# Patient Record
Sex: Female | Born: 1935 | Race: Black or African American | Hispanic: No | Smoking: Never smoker
Health system: Southern US, Community
[De-identification: ages and names within clinical notes are randomized; demographics above are authoritative.]

## PROBLEM LIST (undated history)

## (undated) DIAGNOSIS — Z1612 Extended spectrum beta lactamase (ESBL) resistance: Secondary | ICD-10-CM

## (undated) DIAGNOSIS — F0390 Unspecified dementia without behavioral disturbance: Secondary | ICD-10-CM

## (undated) DIAGNOSIS — K5900 Constipation, unspecified: Secondary | ICD-10-CM

## (undated) DIAGNOSIS — I739 Peripheral vascular disease, unspecified: Secondary | ICD-10-CM

## (undated) DIAGNOSIS — E119 Type 2 diabetes mellitus without complications: Secondary | ICD-10-CM

## (undated) DIAGNOSIS — N189 Chronic kidney disease, unspecified: Secondary | ICD-10-CM

## (undated) DIAGNOSIS — K219 Gastro-esophageal reflux disease without esophagitis: Secondary | ICD-10-CM

## (undated) DIAGNOSIS — E559 Vitamin D deficiency, unspecified: Secondary | ICD-10-CM

## (undated) DIAGNOSIS — M810 Age-related osteoporosis without current pathological fracture: Secondary | ICD-10-CM

## (undated) DIAGNOSIS — K3184 Gastroparesis: Secondary | ICD-10-CM

## (undated) DIAGNOSIS — E876 Hypokalemia: Secondary | ICD-10-CM

## (undated) DIAGNOSIS — I749 Embolism and thrombosis of unspecified artery: Secondary | ICD-10-CM

## (undated) DIAGNOSIS — E785 Hyperlipidemia, unspecified: Secondary | ICD-10-CM

## (undated) DIAGNOSIS — R1310 Dysphagia, unspecified: Secondary | ICD-10-CM

## (undated) DIAGNOSIS — E8809 Other disorders of plasma-protein metabolism, not elsewhere classified: Secondary | ICD-10-CM

## (undated) DIAGNOSIS — I639 Cerebral infarction, unspecified: Secondary | ICD-10-CM

## (undated) DIAGNOSIS — L89329 Pressure ulcer of left buttock, unspecified stage: Secondary | ICD-10-CM

## (undated) DIAGNOSIS — R339 Retention of urine, unspecified: Secondary | ICD-10-CM

## (undated) DIAGNOSIS — N319 Neuromuscular dysfunction of bladder, unspecified: Secondary | ICD-10-CM

## (undated) DIAGNOSIS — I10 Essential (primary) hypertension: Secondary | ICD-10-CM

## (undated) HISTORY — PX: OTHER SURGICAL HISTORY: SHX169

## (undated) HISTORY — PX: CHOLECYSTECTOMY: SHX55

## (undated) HISTORY — DX: Essential (primary) hypertension: I10

## (undated) HISTORY — PX: BELOW KNEE LEG AMPUTATION: SUR23

## (undated) HISTORY — DX: Cerebral infarction, unspecified: I63.9

---

## 1999-08-20 ENCOUNTER — Encounter: Payer: Self-pay | Admitting: Vascular Surgery

## 1999-08-21 ENCOUNTER — Ambulatory Visit: Admission: RE | Admit: 1999-08-21 | Discharge: 1999-08-21 | Payer: Self-pay | Admitting: Vascular Surgery

## 1999-09-02 ENCOUNTER — Inpatient Hospital Stay: Admission: RE | Admit: 1999-09-02 | Discharge: 1999-09-06 | Payer: Self-pay | Admitting: Vascular Surgery

## 1999-11-19 ENCOUNTER — Encounter: Payer: Self-pay | Admitting: Vascular Surgery

## 1999-11-20 ENCOUNTER — Inpatient Hospital Stay (HOSPITAL_COMMUNITY): Admission: RE | Admit: 1999-11-20 | Discharge: 1999-11-23 | Payer: Self-pay | Admitting: Vascular Surgery

## 1999-11-20 ENCOUNTER — Encounter: Payer: Self-pay | Admitting: Vascular Surgery

## 2000-01-15 ENCOUNTER — Encounter (INDEPENDENT_AMBULATORY_CARE_PROVIDER_SITE_OTHER): Payer: Self-pay | Admitting: Specialist

## 2000-01-15 ENCOUNTER — Inpatient Hospital Stay (HOSPITAL_COMMUNITY): Admission: RE | Admit: 2000-01-15 | Discharge: 2000-01-20 | Payer: Self-pay | Admitting: Vascular Surgery

## 2000-01-20 ENCOUNTER — Inpatient Hospital Stay (HOSPITAL_COMMUNITY)
Admission: RE | Admit: 2000-01-20 | Discharge: 2000-01-27 | Payer: Self-pay | Admitting: Physical Medicine and Rehabilitation

## 2000-02-16 ENCOUNTER — Encounter
Admission: RE | Admit: 2000-02-16 | Discharge: 2000-05-16 | Payer: Self-pay | Admitting: Physical Medicine and Rehabilitation

## 2000-05-17 ENCOUNTER — Encounter
Admission: RE | Admit: 2000-05-17 | Discharge: 2000-07-07 | Payer: Self-pay | Admitting: Physical Medicine and Rehabilitation

## 2001-04-13 ENCOUNTER — Ambulatory Visit (HOSPITAL_COMMUNITY): Admission: RE | Admit: 2001-04-13 | Discharge: 2001-04-14 | Payer: Self-pay | Admitting: Ophthalmology

## 2001-04-13 ENCOUNTER — Encounter: Payer: Self-pay | Admitting: Ophthalmology

## 2001-08-22 ENCOUNTER — Ambulatory Visit (HOSPITAL_COMMUNITY): Admission: RE | Admit: 2001-08-22 | Discharge: 2001-08-23 | Payer: Self-pay | Admitting: Ophthalmology

## 2001-08-22 ENCOUNTER — Encounter: Payer: Self-pay | Admitting: Ophthalmology

## 2002-05-17 ENCOUNTER — Encounter: Payer: Self-pay | Admitting: Emergency Medicine

## 2002-05-17 ENCOUNTER — Inpatient Hospital Stay (HOSPITAL_COMMUNITY): Admission: EM | Admit: 2002-05-17 | Discharge: 2002-05-19 | Payer: Self-pay | Admitting: Emergency Medicine

## 2002-06-04 ENCOUNTER — Inpatient Hospital Stay (HOSPITAL_COMMUNITY): Admission: EM | Admit: 2002-06-04 | Discharge: 2002-06-26 | Payer: Self-pay | Admitting: Emergency Medicine

## 2002-06-04 ENCOUNTER — Encounter: Payer: Self-pay | Admitting: Emergency Medicine

## 2002-06-05 ENCOUNTER — Encounter: Payer: Self-pay | Admitting: *Deleted

## 2002-06-14 ENCOUNTER — Encounter: Payer: Self-pay | Admitting: Family Medicine

## 2002-06-19 ENCOUNTER — Encounter: Payer: Self-pay | Admitting: Family Medicine

## 2002-06-20 ENCOUNTER — Encounter: Payer: Self-pay | Admitting: Neurology

## 2002-06-26 ENCOUNTER — Inpatient Hospital Stay (HOSPITAL_COMMUNITY)
Admission: RE | Admit: 2002-06-26 | Discharge: 2002-07-18 | Payer: Self-pay | Admitting: Physical Medicine & Rehabilitation

## 2002-09-13 HISTORY — PX: BELOW KNEE LEG AMPUTATION: SUR23

## 2002-09-19 ENCOUNTER — Encounter
Admission: RE | Admit: 2002-09-19 | Discharge: 2002-12-18 | Payer: Self-pay | Admitting: Physical Medicine & Rehabilitation

## 2002-09-20 ENCOUNTER — Encounter: Payer: Self-pay | Admitting: Vascular Surgery

## 2002-09-20 ENCOUNTER — Inpatient Hospital Stay (HOSPITAL_COMMUNITY): Admission: EM | Admit: 2002-09-20 | Discharge: 2002-10-03 | Payer: Self-pay | Admitting: Emergency Medicine

## 2002-09-21 ENCOUNTER — Encounter (INDEPENDENT_AMBULATORY_CARE_PROVIDER_SITE_OTHER): Payer: Self-pay | Admitting: Specialist

## 2002-11-03 ENCOUNTER — Inpatient Hospital Stay (HOSPITAL_COMMUNITY): Admission: EM | Admit: 2002-11-03 | Discharge: 2002-11-06 | Payer: Self-pay | Admitting: Emergency Medicine

## 2002-11-03 ENCOUNTER — Encounter: Payer: Self-pay | Admitting: Emergency Medicine

## 2003-03-07 ENCOUNTER — Emergency Department (HOSPITAL_COMMUNITY): Admission: EM | Admit: 2003-03-07 | Discharge: 2003-03-07 | Payer: Self-pay | Admitting: Emergency Medicine

## 2004-01-03 ENCOUNTER — Inpatient Hospital Stay (HOSPITAL_COMMUNITY): Admission: EM | Admit: 2004-01-03 | Discharge: 2004-01-07 | Payer: Self-pay | Admitting: Emergency Medicine

## 2004-01-09 ENCOUNTER — Ambulatory Visit (HOSPITAL_COMMUNITY): Admission: RE | Admit: 2004-01-09 | Discharge: 2004-01-09 | Payer: Self-pay | Admitting: Internal Medicine

## 2005-03-01 ENCOUNTER — Ambulatory Visit: Admission: RE | Admit: 2005-03-01 | Discharge: 2005-03-01 | Payer: Self-pay | Admitting: Internal Medicine

## 2005-05-14 ENCOUNTER — Ambulatory Visit (HOSPITAL_COMMUNITY): Admission: RE | Admit: 2005-05-14 | Discharge: 2005-05-14 | Payer: Self-pay | Admitting: Internal Medicine

## 2006-01-03 ENCOUNTER — Ambulatory Visit (HOSPITAL_COMMUNITY): Admission: RE | Admit: 2006-01-03 | Discharge: 2006-01-03 | Payer: Self-pay | Admitting: Internal Medicine

## 2006-06-27 ENCOUNTER — Ambulatory Visit (HOSPITAL_COMMUNITY): Admission: RE | Admit: 2006-06-27 | Discharge: 2006-06-27 | Payer: Self-pay | Admitting: Internal Medicine

## 2006-08-17 ENCOUNTER — Ambulatory Visit: Payer: Self-pay | Admitting: Vascular Surgery

## 2007-11-03 ENCOUNTER — Inpatient Hospital Stay (HOSPITAL_COMMUNITY): Admission: EM | Admit: 2007-11-03 | Discharge: 2007-11-05 | Payer: Self-pay | Admitting: Emergency Medicine

## 2008-07-01 ENCOUNTER — Ambulatory Visit (HOSPITAL_COMMUNITY): Admission: RE | Admit: 2008-07-01 | Discharge: 2008-07-01 | Payer: Self-pay | Admitting: Internal Medicine

## 2008-09-22 ENCOUNTER — Inpatient Hospital Stay (HOSPITAL_COMMUNITY): Admission: EM | Admit: 2008-09-22 | Discharge: 2008-09-26 | Payer: Self-pay | Admitting: Emergency Medicine

## 2008-09-23 ENCOUNTER — Ambulatory Visit: Payer: Self-pay | Admitting: Gastroenterology

## 2008-09-23 ENCOUNTER — Encounter: Payer: Self-pay | Admitting: Gastroenterology

## 2008-09-23 HISTORY — PX: ESOPHAGOGASTRODUODENOSCOPY: SHX1529

## 2008-09-24 ENCOUNTER — Encounter: Payer: Self-pay | Admitting: Gastroenterology

## 2008-09-25 ENCOUNTER — Ambulatory Visit: Payer: Self-pay | Admitting: Internal Medicine

## 2008-11-05 DIAGNOSIS — I10 Essential (primary) hypertension: Secondary | ICD-10-CM

## 2008-11-05 DIAGNOSIS — J449 Chronic obstructive pulmonary disease, unspecified: Secondary | ICD-10-CM | POA: Insufficient documentation

## 2008-11-05 DIAGNOSIS — R112 Nausea with vomiting, unspecified: Secondary | ICD-10-CM

## 2008-11-05 DIAGNOSIS — E785 Hyperlipidemia, unspecified: Secondary | ICD-10-CM | POA: Insufficient documentation

## 2008-11-05 DIAGNOSIS — J4489 Other specified chronic obstructive pulmonary disease: Secondary | ICD-10-CM | POA: Insufficient documentation

## 2008-11-05 DIAGNOSIS — F329 Major depressive disorder, single episode, unspecified: Secondary | ICD-10-CM

## 2008-11-05 DIAGNOSIS — K922 Gastrointestinal hemorrhage, unspecified: Secondary | ICD-10-CM | POA: Insufficient documentation

## 2008-11-05 DIAGNOSIS — I739 Peripheral vascular disease, unspecified: Secondary | ICD-10-CM | POA: Insufficient documentation

## 2009-03-24 ENCOUNTER — Ambulatory Visit: Payer: Self-pay | Admitting: Internal Medicine

## 2009-09-03 ENCOUNTER — Encounter: Payer: Self-pay | Admitting: Gastroenterology

## 2009-09-29 ENCOUNTER — Encounter: Payer: Self-pay | Admitting: Urgent Care

## 2009-09-30 ENCOUNTER — Encounter (INDEPENDENT_AMBULATORY_CARE_PROVIDER_SITE_OTHER): Payer: Self-pay | Admitting: *Deleted

## 2009-10-06 ENCOUNTER — Telehealth (INDEPENDENT_AMBULATORY_CARE_PROVIDER_SITE_OTHER): Payer: Self-pay

## 2009-10-27 ENCOUNTER — Telehealth: Payer: Self-pay | Admitting: Gastroenterology

## 2009-10-27 ENCOUNTER — Ambulatory Visit: Payer: Self-pay | Admitting: Gastroenterology

## 2009-10-27 DIAGNOSIS — K3184 Gastroparesis: Secondary | ICD-10-CM

## 2009-12-18 ENCOUNTER — Ambulatory Visit: Payer: Self-pay | Admitting: Internal Medicine

## 2009-12-18 ENCOUNTER — Emergency Department (HOSPITAL_COMMUNITY): Admission: EM | Admit: 2009-12-18 | Discharge: 2009-12-18 | Payer: Self-pay | Admitting: Emergency Medicine

## 2009-12-20 ENCOUNTER — Inpatient Hospital Stay (HOSPITAL_COMMUNITY): Admission: EM | Admit: 2009-12-20 | Discharge: 2009-12-25 | Payer: Self-pay | Admitting: Emergency Medicine

## 2009-12-22 ENCOUNTER — Encounter (INDEPENDENT_AMBULATORY_CARE_PROVIDER_SITE_OTHER): Payer: Self-pay | Admitting: *Deleted

## 2009-12-24 ENCOUNTER — Ambulatory Visit: Payer: Self-pay | Admitting: Internal Medicine

## 2010-01-02 ENCOUNTER — Encounter (INDEPENDENT_AMBULATORY_CARE_PROVIDER_SITE_OTHER): Payer: Self-pay

## 2010-01-08 ENCOUNTER — Inpatient Hospital Stay (HOSPITAL_COMMUNITY): Admission: EM | Admit: 2010-01-08 | Discharge: 2010-01-12 | Payer: Self-pay | Admitting: Emergency Medicine

## 2010-01-09 ENCOUNTER — Encounter: Payer: Self-pay | Admitting: Gastroenterology

## 2010-01-09 ENCOUNTER — Ambulatory Visit: Payer: Self-pay | Admitting: Family Medicine

## 2010-01-10 ENCOUNTER — Ambulatory Visit: Payer: Self-pay | Admitting: Gastroenterology

## 2010-02-05 ENCOUNTER — Ambulatory Visit: Payer: Self-pay | Admitting: Gastroenterology

## 2010-04-05 ENCOUNTER — Encounter: Payer: Self-pay | Admitting: Internal Medicine

## 2010-04-14 NOTE — Letter (Signed)
Summary: Plan of Care, Need to Discuss  [facility]  [address]   [place], [place] [address]   Phone: [phone]  Fax: [phone]    January 02, 2010  [name] [address] [place], [place]  [address] [birth year]   Dear [name],   We are writing this letter to inform you of treatment plans and/or discuss your plan of care.  We have tried several times to contact you; however, we have yet to reach you.  Dr. Darrick Penna has instructed me to triage you and get you scheduled for a screening colonoscopy. Please call the office at  [phone] and ask to speak with me.     Please do not neglect your health.   Sincerely,    Cloria Spring LPN  [facility] Ph: [phone]    Fax: [phone]

## 2010-04-14 NOTE — Letter (Signed)
Summary: Recall Office Visit  [facility]  [address]   [place], [place] [address]   Phone: [phone]  Fax: [phone]      September 30, 2009   [name] [address] [place], [place]  [address] [birth year]   Dear Ms. Knaus,   According to our records, it is time for you to schedule a follow-up office visit with [place].   At your convenience, please call [phone] to schedule an office visit. If you have any questions, concerns, or feel that this letter is in error, we would appreciate your call.   Sincerely,    Diana Eves  [facility] Ph: [phone]   Fax: [phone]

## 2010-04-14 NOTE — Progress Notes (Signed)
Summary: omeprazole refill  Phone Note Call from Patient   Caller: Daughter- [name] [phone] Summary of Call: pts daughter called- pt is out of omeprazole- wants to know if they can get 1 more refill to last untill pts appt on 10/27/2009. pt uses [facility]. please advise Initial call taken by: Hendricks Limes LPN,  October 06, 2009 11:02 AM     Appended Document: omeprazole refill  Please let pt know she needs to keep OV.  Thanks  Prescriptions: OMEPRAZOLE 20 MG CPDR (OMEPRAZOLE) 1 by mouth daily for acid reflux  #31 x 11   Entered and Authorized by:   Joselyn Arrow FNP-BC   Signed by:   Joselyn Arrow FNP-BC on 10/06/2009   Method used:   Electronically to        [facility] (retail)       [address]       [place], [place]  [address]       Ph: [phone] or [phone]       Fax: [phone]   RxID:   [id number]     Appended Document: omeprazole refill Called, mail box is full.  Appended Document: omeprazole refill Informed pt's daughter.

## 2010-04-14 NOTE — Medication Information (Signed)
Summary: Tax adviser   Imported By: Diana Eves 09/29/2009 10:30:40  _____________________________________________________________________  External Attachment:    Type:   Image     Comment:   External Document  Appended Document: RX Folder Pt needs OV 1st.  Hx NS/cancellation.  Not seen since hospitalization last year.  Appended Document: RX Folder pharmacy aware  Appended Document: RX Folder mailed letter for pt to call us to set up appt to be able to continue getting RFs

## 2010-04-14 NOTE — Medication Information (Signed)
Summary: Tax adviser   Imported By: Rosine Beat 09/29/2009 16:30:23  _____________________________________________________________________  External Attachment:    Type:   Image     Comment:   External Document  Appended Document: RX Folder duplicate

## 2010-04-14 NOTE — Letter (Signed)
Summary: TCS ORDER/TRIAGE  TCS ORDER/TRIAGE   Imported By: Rexene Alberts 01/09/2010 11:58:45  _____________________________________________________________________  External Attachment:    Type:   Image     Comment:   External Document

## 2010-04-14 NOTE — Assessment & Plan Note (Signed)
Summary: GASTROPARESIS   Visit Type:  Follow-up Visit Primary Care Provider:  Felecia Shelling, M.D.  Chief Complaint:  GASTROPARESIS and GERD.  History of Present Illness: No vomiting, or nausea. Heartburn is controlled. Bms: every 2-3 days and good BMs-formed and large. uSE mIRALAX as needed. WORKS VERY WELL. Losing weight on purpose. No blurry vision, tardive dyskinesia, or breast discharge.  Current Medications (verified): 1)  Aspirin 81 Mg Tabs (Aspirin) .... Once Daily 2)  Colace 100 Mg Caps (Docusate Sodium) .... Two Times A Day 3)  Folic Acid 1 Mg Tabs (Folic Acid) .... Every Day 4)  Vitamin C 250 Mg Tabs (Ascorbic Acid) .... Once Daily 5)  Miralax  Powd (Polyethylene Glycol 3350) .... Once Daily 6)  Multivitamins  Tabs (Multiple Vitamin) .... Once Daily 7)  K-Lor 20 Meq Pack (Potassium Chloride) .... Once Daily 8)  Omeprazole 20 Mg Cpdr (Omeprazole) .Marland Kitchen.. 1 By Mouth Daily For Acid Reflux 9)  Metoclopramide Hcl 5 Mg Tabs (Metoclopramide Hcl) .Marland Kitchen.. 1 By Mouth Qac and Hs 10)  Lisinopril 20 Mg Tabs (Lisinopril) .... Take 1 Tablet By Mouth Once A Day 11)  Actos 30 Mg Tabs (Pioglitazone Hcl) .... Take 1 Tablet By Mouth Once A Day 12)  Amlodipine .... As Directed  Allergies (verified): 1)  ! Pcn  Past History:  Past Medical History: Hypertension Stroke  Family History: No FH of Colon Cancer OR POLYPS  Social History: Pt able to ambulate without assistance. Has a BEDSIDE COMMODE. TOBACCO/Alcohol Use - no  Vital Signs:  Patient profile:   77 year old female Height:      64 inches Temp:     98.6 degrees F oral Pulse rate:   72 / minute BP sitting:   140 / 70  (left arm) Cuff size:   large  Vitals Entered By: Cloria Spring LPN (October 27, 2009 2:30 PM)  Physical Exam  General:  Well developed, well nourished, no acute distress. Head:  Normocephalic and atraumatic. Lungs:  Clear throughout to auscultation. Heart:  Regular rate and rhythm; 2/6 SYSTOLIC murmur Abdomen:  Soft,  nontender and nondistended.  Normal bowel sounds. EXAMINED IN CHAIR.  Impression & Recommendations:  Problem # 1:  GASTROPARESIS (ICD-536.3) Assessment Improved  Continue Reglan. No side effects. Refill REGLAN/OMP for one year. OPV in 12 mos.  CC: PCP  Orders: Est. Patient Level II ([address])  Problem # 2:  SCREENING, COLON CANCER (ICD-V76.51) Assessment: Comment Only TCS? after 2000. No alarm symptoms. Will review prior TCS report. Will call pt for TCS if > 10 years. Prescriptions: METOCLOPRAMIDE HCL 5 MG TABS (METOCLOPRAMIDE HCL) 1 by mouth QAC AND HS  #120 x 11   Entered and Authorized by:   West Bali MD   Signed by:   West Bali MD on 10/27/2009   Method used:   Electronically to        [facility] (retail)       [address]       [place], [place]  [address]       Ph: [phone] or [phone]       Fax: [phone]   RxID:   [id number]   Appended Document: GASTROPARESIS 12 MONTH OPV IS IN THE COMPUTER

## 2010-04-14 NOTE — Miscellaneous (Signed)
Summary: CONSULTATION  Clinical Lists Changes NAMEBRONTE, [name]                  ACCOUNT NO.:  [id number]      MEDICAL RECORD NO.:  [id number]          PATIENT TYPE:  INP      LOCATION:  A340                          FACILITY:  [facility]      PHYSICIAN:  R. Roetta Sessions, M.D. DATE OF BIRTH:  [birth year]      DATE OF CONSULTATION:  12/21/2009   DATE OF DISCHARGE:                                    CONSULTATION         REASON FOR CONSULTATION:  Nausea, vomiting, history of gastroparesis.      HISTORY OF PRESENT ILLNESS:  [name] is a pleasant 77 year old African   American lady with longstanding poorly-controlled diabetes mellitus.   She started having nausea and vomiting about 4 days ago.  She has not   had any abdominal pain whatsoever.  She was evaluated in the emergency   apartment on December 18, 2009 and was found to have a blood sugar over   500, she also had evidence of a urinary tract infection with pyuria and   bacteruria on urinalysis.  She has been admitted to the hospital, placed   on IV fluids, hypokalemia has been corrected, her high blood sugar has   been treated and she has been started on Reglan and 5 mg IV q.6 hours as   well as b.i.d. Protonix intravenously.      This lady has a history of gastroparesis documented on a gastric   emptying study previously and as an outpatient she had been taking   Reglan and 5 mg orally twice daily (she apparently is supposed to be on   an a.c., h.s./q.i.d. regimen).  She has also been on omeprazole 20 mg   orally just once daily.      She was seen for a question of hematemesis in July of last year   associated with protracted nausea and vomiting.  She was seen by Dr.   Darrick Penna.  EGD demonstrated gastritis (no H. pylori on biopsies) and   erosive reflux esophagitis.  At that time it was noted that the patient   had never had a colonoscopy and it was recommended she return to have   one done.  She has not followed through.      Ms.  [name] denies any fever, chills.  She has not had any dysphagia or   odynophagia.  She has had chronic early satiety, which has worsened   somewhat recently.  She denies constipation, having one formed stool   daily.  Denies melena or hematochezia.      __________ CT scan, noncontrast on October 6th which demonstrated a   moderate amount of rectal stool, nonobstructing bilateral renal calculi,   no other acute abnormality otherwise.  Small hiatal hernia.  She has   been started on IV Cipro.  She is also on enoxaparin.  Her GI   medications include metoclopramide pantoprazole and p.r.n. Zofran.      This morning her H and H are 14.3 and 43.7.  On  October 8th her H and H   were 15 and 46.Marland Kitchen  Her white count this morning was 11.2, platelet count   298,000.  Her comprehensive metabolic profile this morning demonstrates   a total bilirubin slightly elevated at 1.9, alkaline phosphate 90, AST   and ALT are 22 and 8 respectively.  She does have a history of Gilbert's   syndrome.  Total bilirubin was 1.6 back on October 6th.  Hepatic   function profile on October 8th demonstrated elevated bilirubin of 1.9   with an indirect component of 1.6, alkaline phosphatase, AST and ALT   came back normal      PAST MEDICAL HISTORY:   1. Significant for longstanding poorly-controlled diabetes mellitus.   2. History of hypertension.   3. History of cerebrovascular accident.   4. History of diabetic retinopathy.   5. Peripheral arterial disease with bilateral below-the-knee       amputation.   6. History left femoral-tibial bypass.   7. History of coronary artery disease.   8. History of GERD with erosive reflux esophagitis.   9. Status post cholecystectomy.   10.Status post hysterectomy.      MEDICATIONS ON ADMISSION:  Metoclopramide 5 mg b.i.d., aspirin,   omeprazole, lovastatin, amlodipine, lisinopril, potassium chloride, NPH   insulin.      ALLERGIES:  PENICILLIN CAUSES A RASH.      FAMILY HISTORY:   Mother reportedly died of stomach cancer.  No history   of colon cancer or colon polyps.      SOCIAL HISTORY:  Patient lives in [place].  She lives with her son   and daughter.  She has an aid daily on a regular basis.  No history of   tobacco, alcohol or illicit drug use.      REVIEW OF SYSTEMS:  She denies any history of yellow jaundice, clay-   colored stools, dark-colored urine.  Again, no fever or chills.   Otherwise as in history of present illness.      PHYSICAL EXAMINATION:  GENERAL:  Pleasant, chronically ill lady resting   comfortably, conversant, in no acute distress.   VITAL SIGNS:  Temperature 98.7, pulse 87, respiratory rate 16, BP   180/77, O2 saturation on room-air 98%.   SKIN:  Warm and dry.   HEENT:  No scleral icterus.   CHEST:  Lungs clear to auscultation.   HEART:  Regular rate and rhythm without murmur, rub or gallop.   ABDOMEN:  Full, positive bowel sounds.  She does have a succussion   splash.  Soft and nontender without appreciable mass or organomegaly.   EXTREMITIES:  Bilateral amputee.      ADDITIONAL LABORATORY DATA:  Magnesium 1.9 this morning.  CMP this   morning:  Sodium 135, potassium 3.6, chloride 93, CO2 26, glucose 265,   BUN 10, creatinine 0.74, total bilirubin 1.9, alkaline phosphatase 90,   AST 22, ALT 8.  CBC:  White count 11.2, H and H 14.3/43.7.  Urinalysis   with many bacteria, 7-10 white blood cells.      IMPRESSION:  [name] is a pleasant 77 year old lady admitted to   the hospital with nausea and vomiting in the setting of uncontrolled   diabetes mellitus, hyperglycemia, known gastroparesis, along with a   urinary tract infection.  She is already markedly improved with the   above-mentioned treatment.  She has been taking a relatively low dose of   Reglan as an outpatient for reasons which are not  clear.  She has only   been on a low dose of Reglan in a b.i.d. fashion.  I suspect that her   gastroesophageal reflux disease  has worsened recently in this acute   setting.  She absolutely has no abdominal pain.  Gallbladder is out.   Findings of radiographic and laboratory evaluation also reassuring as   far as the not indicative of any other process.  She does have Gilbert's   syndrome which deserve further consideration.      RECOMMENDATIONS:   1. Agree with b.i.d. proton pump inhibitor therapy with Protonix 40 mg       q.12 hours.   2. Agree with Reglan 5 mg IV q.6 hours.   3. She does appear to be doing well now on a clear liquid diet.  Would       consider advancing her to a carbohydrate modified gastroparesis       diet by tomorrow morning.   4. As previously recommended, this lady has never had a screening       colonoscopy as best we can tell.  Again, strongly recommended she       return as an outpatient in the near future and get this done.      I would like to thank Dr. Felecia Shelling and the hospitalist service for allowing   me to see this nice lady in consultation.               Jonathon Bellows, M.D.               RMR/MEDQ  D:  12/21/2009  T:  12/21/2009  Job:  [id number]      cc:   Tesfaye D. Felecia Shelling, MD   Fax: [phone]      Electronically Signed by Lorrin Goodell M.D. on 12/21/2009 05:38:45 PM

## 2010-04-14 NOTE — Progress Notes (Signed)
Summary: NEEDS TCS  Please call pt. She has never had a TCS. Screening TCS within the next 2 mos, TRILYTE PREP. West Bali MD  October 27, 2009 3:14 PM  Appended Document: NEEDS TCS LM with caregiver, May Motley. She will give message to pt's daughter, Elita Quick and have her return  call tomorrow.  Appended Document: NEEDS TCS Called, mail box full.  Appended Document: NEEDS TCS Letter mailed to call.

## 2010-04-14 NOTE — Letter (Signed)
Summary: [employer] approval of medicare script drug coverage  silverscript approval of medicare script drug coverage   Imported By: Rosine Beat 09/03/2009 09:59:41  _____________________________________________________________________  External Attachment:    Type:   Image     Comment:   External Document

## 2010-05-27 LAB — URINALYSIS, ROUTINE W REFLEX MICROSCOPIC
Bilirubin Urine: NEGATIVE
Bilirubin Urine: NEGATIVE
Glucose, UA: 100 mg/dL — AB
Glucose, UA: 500 mg/dL — AB
Glucose, UA: >1000 mg/dL — AB
Ketones, ur: NEGATIVE mg/dL
Ketones, ur: 15 mg/dL — AB
Nitrite: NEGATIVE
Protein, ur: NEGATIVE mg/dL
Specific Gravity, Urine: 1.015 (ref 1.005–1.030)
Specific Gravity, Urine: 1.015 (ref 1.005–1.030)
Urobilinogen, UA: 1 mg/dL (ref 0.0–1.0)
pH: 6 (ref 5.0–8.0)

## 2010-05-27 LAB — GLUCOSE, CAPILLARY
Glucose-Capillary: 101 mg/dL — ABNORMAL HIGH (ref 70–99)
Glucose-Capillary: 107 mg/dL — ABNORMAL HIGH (ref 70–99)
Glucose-Capillary: 160 mg/dL — ABNORMAL HIGH (ref 70–99)
Glucose-Capillary: 171 mg/dL — ABNORMAL HIGH (ref 70–99)
Glucose-Capillary: 175 mg/dL — ABNORMAL HIGH (ref 70–99)
Glucose-Capillary: 178 mg/dL — ABNORMAL HIGH (ref 70–99)
Glucose-Capillary: 179 mg/dL — ABNORMAL HIGH (ref 70–99)
Glucose-Capillary: 180 mg/dL — ABNORMAL HIGH (ref 70–99)
Glucose-Capillary: 70 mg/dL (ref 70–99)
Glucose-Capillary: 77 mg/dL (ref 70–99)
Glucose-Capillary: 160 mg/dL — ABNORMAL HIGH (ref 70–99)
Glucose-Capillary: 183 mg/dL — ABNORMAL HIGH (ref 70–99)
Glucose-Capillary: 191 mg/dL — ABNORMAL HIGH (ref 70–99)
Glucose-Capillary: 192 mg/dL — ABNORMAL HIGH (ref 70–99)
Glucose-Capillary: 202 mg/dL — ABNORMAL HIGH (ref 70–99)
Glucose-Capillary: 208 mg/dL — ABNORMAL HIGH (ref 70–99)
Glucose-Capillary: 233 mg/dL — ABNORMAL HIGH (ref 70–99)
Glucose-Capillary: 238 mg/dL — ABNORMAL HIGH (ref 70–99)
Glucose-Capillary: 246 mg/dL — ABNORMAL HIGH (ref 70–99)
Glucose-Capillary: 261 mg/dL — ABNORMAL HIGH (ref 70–99)
Glucose-Capillary: 264 mg/dL — ABNORMAL HIGH (ref 70–99)
Glucose-Capillary: 269 mg/dL — ABNORMAL HIGH (ref 70–99)
Glucose-Capillary: 276 mg/dL — ABNORMAL HIGH (ref 70–99)
Glucose-Capillary: 286 mg/dL — ABNORMAL HIGH (ref 70–99)
Glucose-Capillary: 299 mg/dL — ABNORMAL HIGH (ref 70–99)
Glucose-Capillary: 365 mg/dL — ABNORMAL HIGH (ref 70–99)
Glucose-Capillary: 431 mg/dL — ABNORMAL HIGH (ref 70–99)
Glucose-Capillary: 94 mg/dL (ref 70–99)

## 2010-05-27 LAB — COMPREHENSIVE METABOLIC PANEL
ALT: 8 U/L (ref 0–35)
ALT: 8 U/L (ref 0–35)
ALT: 8 U/L (ref 0–35)
AST: 17 U/L (ref 0–37)
AST: 22 U/L (ref 0–37)
Alkaline Phosphatase: 90 U/L (ref 39–117)
CO2: 22 mEq/L (ref 19–32)
CO2: 26 mEq/L (ref 19–32)
CO2: 29 mEq/L (ref 19–32)
Calcium: 9.6 mg/dL (ref 8.4–10.5)
Calcium: 8.5 mg/dL (ref 8.4–10.5)
Calcium: 9.6 mg/dL (ref 8.4–10.5)
Chloride: 102 mEq/L (ref 96–112)
Creatinine, Ser: 1.5 mg/dL — ABNORMAL HIGH (ref 0.4–1.2)
GFR calc Af Amer: >60 mL/min (ref >60)
GFR calc Af Amer: >60 mL/min (ref >60)
GFR calc non Af Amer: 34 mL/min — ABNORMAL LOW (ref >60)
GFR calc non Af Amer: 50 mL/min — ABNORMAL LOW (ref >60)
GFR calc non Af Amer: >60 mL/min (ref >60)
Glucose, Bld: 267 mg/dL — ABNORMAL HIGH (ref 70–99)
Glucose, Bld: 265 mg/dL — ABNORMAL HIGH (ref 70–99)
Potassium: 3.6 mEq/L (ref 3.5–5.1)
Sodium: 135 mEq/L (ref 135–145)
Sodium: 137 mEq/L (ref 135–145)
Total Bilirubin: 1.2 mg/dL (ref 0.3–1.2)
Total Protein: 7 g/dL (ref 6.0–8.3)

## 2010-05-27 LAB — BASIC METABOLIC PANEL
BUN: 4 mg/dL — ABNORMAL LOW (ref 6–23)
BUN: 7 mg/dL (ref 6–23)
BUN: 19 mg/dL (ref 6–23)
BUN: 7 mg/dL (ref 6–23)
BUN: 7 mg/dL (ref 6–23)
CO2: 27 mEq/L (ref 19–32)
CO2: 28 mEq/L (ref 19–32)
CO2: 32 mEq/L (ref 19–32)
Calcium: 8.2 mg/dL — ABNORMAL LOW (ref 8.4–10.5)
Calcium: 8.7 mg/dL (ref 8.4–10.5)
Chloride: 104 mEq/L (ref 96–112)
Chloride: 94 mEq/L — ABNORMAL LOW (ref 96–112)
Chloride: 97 mEq/L (ref 96–112)
Creatinine, Ser: 0.77 mg/dL (ref 0.4–1.2)
Creatinine, Ser: 0.79 mg/dL (ref 0.4–1.2)
GFR calc non Af Amer: >60 mL/min (ref >60)
GFR calc non Af Amer: >60 mL/min (ref >60)
Glucose, Bld: 101 mg/dL — ABNORMAL HIGH (ref 70–99)
Glucose, Bld: 73 mg/dL (ref 70–99)
Glucose, Bld: 177 mg/dL — ABNORMAL HIGH (ref 70–99)
Glucose, Bld: 215 mg/dL — ABNORMAL HIGH (ref 70–99)
Potassium: 3.5 mEq/L (ref 3.5–5.1)
Potassium: 3.1 mEq/L — ABNORMAL LOW (ref 3.5–5.1)
Potassium: 3.5 mEq/L (ref 3.5–5.1)
Sodium: 138 mEq/L (ref 135–145)

## 2010-05-27 LAB — URINE MICROSCOPIC-ADD ON

## 2010-05-27 LAB — CBC
HCT: 38.3 % (ref 36.0–46.0)
HCT: 41.1 % (ref 36.0–46.0)
HCT: 41.4 % (ref 36.0–46.0)
HCT: 43.7 % (ref 36.0–46.0)
HCT: 46 % (ref 36.0–46.0)
Hemoglobin: 13.5 g/dL (ref 12.0–15.0)
Hemoglobin: 13.6 g/dL (ref 12.0–15.0)
Hemoglobin: 14.3 g/dL (ref 12.0–15.0)
Hemoglobin: 14.6 g/dL (ref 12.0–15.0)
MCH: 28.9 pg (ref 26.0–34.0)
MCH: 28.9 pg (ref 26.0–34.0)
MCH: 29.3 pg (ref 26.0–34.0)
MCHC: 32.8 g/dL (ref 30.0–36.0)
MCHC: 32.4 g/dL (ref 30.0–36.0)
MCHC: 32.7 g/dL (ref 30.0–36.0)
MCHC: 32.7 g/dL (ref 30.0–36.0)
MCV: 88.7 fL (ref 78.0–100.0)
MCV: 88.4 fL (ref 78.0–100.0)
MCV: 89.6 fL (ref 78.0–100.0)
Platelets: 238 10*3/uL (ref 150–400)
Platelets: 340 10*3/uL (ref 150–400)
RBC: 4.67 MIL/uL (ref 3.87–5.11)
RBC: 5.07 MIL/uL (ref 3.87–5.11)
RDW: 13.6 % (ref 11.5–15.5)
RDW: 13 % (ref 11.5–15.5)
RDW: 13.1 % (ref 11.5–15.5)
WBC: 4.5 10*3/uL (ref 4.0–10.5)
WBC: 11.2 10*3/uL — ABNORMAL HIGH (ref 4.0–10.5)

## 2010-05-27 LAB — DIFFERENTIAL
Basophils Absolute: 0 10*3/uL (ref 0.0–0.1)
Basophils Absolute: 0.1 10*3/uL (ref 0.0–0.1)
Basophils Absolute: 0 10*3/uL (ref 0.0–0.1)
Basophils Relative: 0 % (ref 0–1)
Basophils Relative: 0 % (ref 0–1)
Eosinophils Absolute: 0 10*3/uL (ref 0.0–0.7)
Eosinophils Absolute: 0 10*3/uL (ref 0.0–0.7)
Eosinophils Absolute: 0 10*3/uL (ref 0.0–0.7)
Eosinophils Absolute: 0 10*3/uL (ref 0.0–0.7)
Eosinophils Relative: 0 % (ref 0–5)
Eosinophils Relative: 0 % (ref 0–5)
Eosinophils Relative: 0 % (ref 0–5)
Eosinophils Relative: 0 % (ref 0–5)
Eosinophils Relative: 1 % (ref 0–5)
Lymphocytes Relative: 4 % — ABNORMAL LOW (ref 12–46)
Lymphocytes Relative: 19 % (ref 12–46)
Lymphs Abs: 0.4 10*3/uL — ABNORMAL LOW (ref 0.7–4.0)
Lymphs Abs: 1.7 10*3/uL (ref 0.7–4.0)
Lymphs Abs: 0.4 10*3/uL — ABNORMAL LOW (ref 0.7–4.0)
Lymphs Abs: 1 10*3/uL (ref 0.7–4.0)
Lymphs Abs: 1.2 10*3/uL (ref 0.7–4.0)
Monocytes Absolute: 0.5 10*3/uL (ref 0.1–1.0)
Monocytes Absolute: 0.2 10*3/uL (ref 0.1–1.0)
Monocytes Absolute: 0.6 10*3/uL (ref 0.1–1.0)
Monocytes Absolute: 0.8 10*3/uL (ref 0.1–1.0)
Monocytes Relative: 10 % (ref 3–12)
Monocytes Relative: 1 % — ABNORMAL LOW (ref 3–12)
Monocytes Relative: 6 % (ref 3–12)
Monocytes Relative: 9 % (ref 3–12)
Neutro Abs: 2.1 10*3/uL (ref 1.7–7.7)
Neutrophils Relative %: 94 % — ABNORMAL HIGH (ref 43–77)
Neutrophils Relative %: 83 % — ABNORMAL HIGH (ref 43–77)

## 2010-05-27 LAB — LIPID PANEL
Cholesterol: 196 mg/dL (ref 0–200)
HDL: 71 mg/dL (ref >39)

## 2010-05-27 LAB — POCT CARDIAC MARKERS
CKMB, poc: 1.5 ng/mL (ref 1.0–8.0)
Myoglobin, poc: 90.3 ng/mL (ref 12–200)
Troponin i, poc: <0.05 ng/mL (ref 0.00–0.09)

## 2010-05-27 LAB — URINE CULTURE: Culture  Setup Time: 201110290125

## 2010-05-27 LAB — HEPATIC FUNCTION PANEL
ALT: 8 U/L (ref 0–35)
AST: 21 U/L (ref 0–37)
Albumin: 3.4 g/dL — ABNORMAL LOW (ref 3.5–5.2)
Total Protein: 6.9 g/dL (ref 6.0–8.3)

## 2010-05-27 LAB — CK TOTAL AND CKMB (NOT AT ARMC)
CK, MB: 2.6 ng/mL (ref 0.3–4.0)
Relative Index: 2.4 (ref 0.0–2.5)

## 2010-05-27 LAB — LIPASE, BLOOD
Lipase: 19 U/L (ref 11–59)
Lipase: 21 U/L (ref 11–59)

## 2010-05-27 LAB — TSH: TSH: 3.408 u[IU]/mL (ref 0.350–4.500)

## 2010-06-21 LAB — DIFFERENTIAL
Basophils Absolute: 0 10*3/uL (ref 0.0–0.1)
Eosinophils Absolute: 0 10*3/uL (ref 0.0–0.7)
Eosinophils Relative: 0 % (ref 0–5)
Eosinophils Relative: 0 % (ref 0–5)
Lymphocytes Relative: 7 % — ABNORMAL LOW (ref 12–46)
Lymphs Abs: 0.6 10*3/uL — ABNORMAL LOW (ref 0.7–4.0)
Lymphs Abs: 0.9 10*3/uL (ref 0.7–4.0)
Monocytes Absolute: 0.3 10*3/uL (ref 0.1–1.0)
Monocytes Relative: 3 % (ref 3–12)
Neutrophils Relative %: 87 % — ABNORMAL HIGH (ref 43–77)
Neutrophils Relative %: 91 % — ABNORMAL HIGH (ref 43–77)

## 2010-06-21 LAB — BASIC METABOLIC PANEL
BUN: 19 mg/dL (ref 6–23)
Calcium: 9.1 mg/dL (ref 8.4–10.5)
Chloride: 101 mEq/L (ref 96–112)
Creatinine, Ser: 0.97 mg/dL (ref 0.4–1.2)
GFR calc Af Amer: >60 mL/min (ref >60)
GFR calc non Af Amer: 56 mL/min — ABNORMAL LOW (ref >60)
Glucose, Bld: 197 mg/dL — ABNORMAL HIGH (ref 70–99)
Potassium: 2.9 mEq/L — ABNORMAL LOW (ref 3.5–5.1)
Potassium: 3.5 mEq/L (ref 3.5–5.1)
Sodium: 138 mEq/L (ref 135–145)

## 2010-06-21 LAB — CBC
HCT: 38.2 % (ref 36.0–46.0)
HCT: 38.7 % (ref 36.0–46.0)
Hemoglobin: 13.4 g/dL (ref 12.0–15.0)
MCV: 90.4 fL (ref 78.0–100.0)
Platelets: 230 10*3/uL (ref 150–400)
RBC: 4.22 MIL/uL (ref 3.87–5.11)
RDW: 14.7 % (ref 11.5–15.5)
WBC: 8.5 10*3/uL (ref 4.0–10.5)
WBC: 9.5 10*3/uL (ref 4.0–10.5)

## 2010-06-21 LAB — HEPATIC FUNCTION PANEL
ALT: 10 U/L (ref 0–35)
AST: 25 U/L (ref 0–37)
Albumin: 3.3 g/dL — ABNORMAL LOW (ref 3.5–5.2)
Albumin: 3.4 g/dL — ABNORMAL LOW (ref 3.5–5.2)
Indirect Bilirubin: 1.2 mg/dL — ABNORMAL HIGH (ref 0.3–0.9)
Total Protein: 6.7 g/dL (ref 6.0–8.3)

## 2010-06-21 LAB — URINALYSIS, ROUTINE W REFLEX MICROSCOPIC
Ketones, ur: NEGATIVE mg/dL
Leukocytes, UA: NEGATIVE
Nitrite: NEGATIVE

## 2010-06-21 LAB — URINE MICROSCOPIC-ADD ON

## 2010-06-21 LAB — MAGNESIUM: Magnesium: 2.6 mg/dL — ABNORMAL HIGH (ref 1.5–2.5)

## 2010-06-21 LAB — GLUCOSE, CAPILLARY
Glucose-Capillary: 114 mg/dL — ABNORMAL HIGH (ref 70–99)
Glucose-Capillary: 140 mg/dL — ABNORMAL HIGH (ref 70–99)
Glucose-Capillary: 146 mg/dL — ABNORMAL HIGH (ref 70–99)
Glucose-Capillary: 152 mg/dL — ABNORMAL HIGH (ref 70–99)
Glucose-Capillary: 153 mg/dL — ABNORMAL HIGH (ref 70–99)
Glucose-Capillary: 156 mg/dL — ABNORMAL HIGH (ref 70–99)
Glucose-Capillary: 165 mg/dL — ABNORMAL HIGH (ref 70–99)
Glucose-Capillary: 180 mg/dL — ABNORMAL HIGH (ref 70–99)

## 2010-06-21 LAB — HEMOGLOBIN A1C: Mean Plasma Glucose: 146 mg/dL

## 2010-07-28 NOTE — Op Note (Signed)
[name], [name]                  ACCOUNT NO.:  [id number]   MEDICAL RECORD NO.:  [id number]          PATIENT TYPE:  INP   LOCATION:  A325                          FACILITY:  [facility]   PHYSICIAN:  Kassie Mends, M.D.      DATE OF BIRTH:  17-Mar-1933   DATE OF PROCEDURE:  09/23/2008  DATE OF DISCHARGE:                               OPERATIVE REPORT   PROCEDURE:  Esophagogastroduodenoscopy with cold forceps biopsy.   REFERRING PHYSICIAN:  Dr. Felecia Shelling.   INDICATION FOR EXAM:  [name] is a 77 year old female who presented  with persistent vomiting which was Gastroccult positive.  She has a  history of esophagitis and gastritis.  She takes aspirin daily.   FINDINGS:  1. A linear erosions seen in the distal esophagus associated with      exudate.  The erosions and ulceration extend into the GE junction.      No evidence of Barrett's, mass or stricture.  2. Diffuse erythema in the body and the antrum with occasional      erosion.  Biopsies obtained via cold forceps to evaluate for H.      pylori gastritis.  3. Normal duodenal bulb, ampulla and second portion of the duodenum      with moderate bile staining.   DIAGNOSIS:  Gastroccult positive emesis likely secondary to esophagitis  and gastritis.   FINAL RECOMMENDATIONS:  1. Omeprazole 20 mg p.o. at 0700 and 1700 daily as an outpatient.      Will continue Protonix at 0700 and 1700 while she is in the      hospital.  2. Schedule gastric emptying study on September 24, 2008.  3. She may have a full liquid diet.  4. Continue Zofran around-the-clock and as needed.  5. No aspirin, NSAIDs or anticoagulation for 7 days.  6. Will await biopsies.   MEDICATIONS:  1. Demerol 50 mg IV.  2. Versed 3 mg IV.   PROCEDURE TECHNIQUE:  Physical exam was performed.  Informed consent was  obtained with the patient after explaining the benefits, risks and  alternatives to the procedure.  The patient was connected to the monitor  and placed in the left lateral  position.  Continuous oxygen was provided  by nasal cannula and IV medicine administered through an indwelling  cannula.  After administration of sedation, the patient's esophagus was  intubated and the scope was advanced under direct visualization to the  second portion of the duodenum.  Scope was removed slowly  by careful examination of the color, texture, anatomy and integrity of  the mucosa on the way out.  The patient was recovered in endoscopy and  discharged to the floor in satisfactory condition.   PATH:  REACTIVE GASTROPATHY      Kassie Mends, M.D.  Electronically Signed     SM/MEDQ  D:  09/23/2008  T:  09/23/2008  Job:  [id number]   cc:   Tesfaye D. Felecia Shelling, MD  Fax: [phone]

## 2010-07-28 NOTE — Consult Note (Signed)
NAMEZIZA, HASTINGS                  ACCOUNT NO.:  [id number]   MEDICAL RECORD NO.:  [id number]          PATIENT TYPE:  INP   LOCATION:  A325                          FACILITY:  [facility]   PHYSICIAN:  Kassie Mends, M.D.      DATE OF BIRTH:  Jul 18, 1933   DATE OF CONSULTATION:  09/23/2008  DATE OF DISCHARGE:                                 CONSULTATION   REFERRING PHYSICIAN:  Dr. Felecia Shelling.   REASON CONSULTATION:  Vomiting.   HISTORY OF PRESENT ILLNESS:  Ms. Starn is a 77 year old female who has  diabetes for over 50 years complicated by diabetic retinopathy,  peripheral vascular disease, and bilateral below-the-knee amputations.  She had 2 upper endoscopies in 2004 which were performed for nausea,  vomiting and decrease in hemoglobin.  The upper endoscopies revealed  erosive esophagitis and gastritis.  She had a hiatal hernia.  H pylori  serologies were negative.  She had an episode of persistent nausea and  vomiting in 2009.  She has been in her usual state of health except for  early satiety over the past 6 months.  She says her appetite has been  good.  On Friday, she began to vomit and could not keep anything down.  By Sunday, she was not any better and her daughter brought her to the  emergency department.  She denies any sick contacts, diarrhea, or fever.  She was tested in the emergency department and her emesis was Hemoccult  positive.  No gross red blood was seen in the emesis or in her stool.  She denies any black stool that looks like tar.  She denies any stomach  pain.  She says her heartburn is usually controlled with omeprazole 20  mg daily.  She has never had a gastric emptying study.  She denies  dysuria, hematuria, chest pain, or shortness of breath.  She does take  aspirin on a daily basis.  She does not use BC powders, Goody powders,  Aleve, ibuprofen or Motrin.  She has only had dry heaves today.  She has  not had a bowel movement since last week.   PAST MEDICAL HISTORY:  1. Hypertension.  2. Cerebrovascular disease.   PAST SURGICAL HISTORY:  1. Hysterectomy.  2. Eye surgery in 2003.  3. Cholecystectomy.  4. Left fem-tibial bypass in 2001.   ALLERGIES:  PENICILLIN.   MEDICATIONS:  1. Lovenox.  2. NovoLog sliding scale.  3. Nitroglycerin patch 0.5 inches topically every 6 hours.  4. Protonix 40 mg IV every 12 hours at 10:00 a.m. and 10:00 p.m.  5. Dilaudid as needed (none since admission).  6. Zofran as needed (8 mg used on September 22, 2008).   FAMILY HISTORY:  She denies any family history of colon cancer or colon  polyps.   SOCIAL HISTORY:  She lives with her children.  She does not use tobacco  or alcohol products.   REVIEW OF SYSTEMS:  She has never had a colonoscopy.  Her review of  systems per the HPI; otherwise, all systems are negative.   PHYSICAL EXAM:  T-max  99.3.  Systolic blood pressure 140/136, 95% on  room air.  GENERAL:  She is in no apparent distress, alert and oriented  x4.HEENT:  Exam is atraumatic, normocephalic.  Pupils equal and reactive  to light.  MOUTH:  No oral lesions.  Dentition fair, posterior pharynx  without erythema or exudate.NECK:  Full range of motion.  No  lymphadenopathy.  LUNGS:  Clear to auscultation bilaterally.CARDIOVASCULAR EXAM:  Regular  rhythm.  Unable to appreciate any murmurs or rubs.  ABDOMEN:  Bowel  sounds are present, soft, nontender, nondistended.  No rebound or  guarding.  Obese.  EXTREMITIES:  Bilateral below-the-knee amputations  without evidence of edema.  No cyanosis.NEURO:  She has no new focal  neurologic deficits.   LABS:  White count 9.5, hemoglobin 13.4, platelets 225, glucose 146,  creatinine 0.97, total bilirubin 1.9, direct bilirubin 0.3, indirect  bilirubin 1.6,  alk phos 62, AST 25, ALT 10, albumin 3.3, magnesium 2.6.  Urinalysis:  Trace blood,  protein with urine microscopic showing 0-2  WBCs, 0-2 RBCs and many bacteria.   RADIOGRAPHY:  Acute abdominal series on September 22, 2008  which showed no  acute intra-abdominal process.   ASSESSMENT:  Ms. Massing is a 77 year old female who presents with acute  onset of vomiting.  The emesis is Gastroccult positive and brown in  color.  These findings are consistent with possible coffee-ground  emesis.  The differential diagnosis for the acute onset and vomiting  includes acute exacerbation of gastroparesis, uncontrolled  gastroesophageal reflux disease, viral illness, and a low likelihood of  NSAID gastritis, H. pylori gastritis, or gastric malignancy.   Thank you for allowing me to see Ms. Larita Fife in consultation.  My  recommendations follow.   The elevated and mildly elevated indirect bilirubin is likely secondary  to Gilbert's syndrome.   RECOMMENDATIONS:  1. Twice daily proton pump inhibitor.  2. Zofran around-the-clock and p.r.n.  3. Will schedule an EGD on today. Should consider a TCS as an      outpatient.  4. Scheduled gastric emptying study on tomorrow.  5. Nothing by mouth except for medications.  Will continue NovoLog      sliding scale.      Kassie Mends, M.D.  Electronically Signed     SM/MEDQ  D:  09/23/2008  T:  09/23/2008  Job:  [id number]   cc:   Tesfaye D. Felecia Shelling, MD  Fax: [phone]

## 2010-07-28 NOTE — H&P (Signed)
[name], FUHS                  ACCOUNT NO.:  [id number]   MEDICAL RECORD NO.:  [id number]          PATIENT TYPE:  INP   LOCATION:  A325                          FACILITY:  [facility]   PHYSICIAN:  Osvaldo Shipper, MD     DATE OF BIRTH:  February 22, 1934   DATE OF ADMISSION:  09/22/2008  DATE OF DISCHARGE:  LH                              HISTORY & PHYSICAL   PRIMARY MEDICAL DOCTOR:  Tesfaye D. Felecia Shelling, MD   We have been asked to admit this patient because the patient and the  family do not want to be admitted by Dr. Sudie Bailey who is covering for  Dr. Felecia Shelling today.   ADMITTING DIAGNOSES:  1. Nausea with vomiting, likely gastroparesis.  2. Diabetes on insulin.  3. Hypertension.  4. History of stroke.  5. Bilateral amputee.   CHIEF COMPLAINT:  Nausea, vomiting since Friday.   HISTORY OF PRESENT ILLNESS:  The patient is a 77 year old African  American female who was in her usual state of health till this past  Friday when she started having nausea followed by emesis.  She did not  notice any blood in the emesis.  She has had multiple episodes with food  and with clear liquids.  Denies any diarrhea and had a normal bowel  movement yesterday.  Denies any abdominal pain.  Denies any fever or  chills.  Did not eat out anywhere recently.  Denies any sick contacts.  The last time she had similar episode was back in August 2009.  She has  never had testing for gastroparesis.   MEDICATIONS AT HOME:  1. Actos 30 mg daily.  2. Lisinopril 20 mg daily.  3. Lovastatin 20 mg daily.  4. Amlodipine 10 mg daily.  5. Potassium chloride 20 mEq daily.  6. Omeprazole 20 mg daily.  7. Humulin N, usually takes 20 units in the morning and 10 in the      afternoon, but sometimes she changes this because of her blood      sugars.   ALLERGIES:  PENICILLIN, which causes rash.   PAST MEDICAL HISTORY:  Positive for acid reflux disease, diabetes for 50  years.  Hypertension, stroke in year 2004 with right  hemiparesis.  No  history of coronary artery disease.  No lung disease.  She had her  amputations bilateral lower extremities because of diabetes.  She has  had fibroid surgeries in the past and a breast cyst removal in the past.   SOCIAL HISTORY:  Lives in [place] with her children.  No smoking,  alcohol, or illicit drug use.   FAMILY HISTORY:  Positive for unknown cancers in her mother, brother,  and sister.  She has also had history of multiple myeloma in the family.  History of diabetes is also present.   REVIEW OF SYMPTOMS:  GENERAL:  Positive for weakness and malaise.  HEENT:  Unremarkable.  CARDIOVASCULAR:  Unremarkable.  GI:  As in HPI.  GU:  Unremarkable.  NEUROLOGIC:  Unremarkable.  PSYCHIATRIC:  Unremarkable.  MUSCULOSKELETAL:  Positive for stump pain  over the last 3  weeks.  OTHER SYSTEMS:  Unremarkable.   PHYSICAL EXAMINATION:  VITAL SIGNS:  Temperature 98.7, blood pressure  149/45, heart rate 95, respiratory rate 18, saturation 100% on room air.  GENERAL:  An overweight, elderly black female in no distress.  HEENT:  There is no pallor.  No icterus.  Oral mucous membrane is  slightly dry.  No oral lesions are noted.  NECK:  Soft and supple.  No thyromegaly is appreciated.  Lymphadenopathy  is noted.  LUNGS:  Clear to auscultation bilaterally, anteriorly.  CARDIOVASCULAR:  S1, S2 is normal and regular.  No S3, S4.  No rubs.  No  rubs.  No bruits.  ABDOMEN:  Obese.  Brief tenderness in the left upper quadrant, but no  rebound, rigidity, or guarding.  Bowel sounds are present.  No masses or  organomegaly is appreciated.  GU:  Deferred.  MUSCULOSKELETAL:  She is status post bilateral amputation.  The stumps  do not reveal any erythema.  No wounds are noted.  No other obvious  deformities noted.  NEUROLOGIC:  She is alert, oriented x3.   LAB DATA:  Her white count is normal.  Hemoglobin is 13.3, MCV is 90,  platelet count is 230, 91% neutrophils, glucose 165, sodium  141,  potassium is 2.9, chloride, bicarb are normal.  Renal function is  normal.  UA shows cloudy urine, trace blood, trace protein.  Negative  nitrite.  Negative leukocyte, 0-2 WBCs, many bacteria.   IMAGING STUDIES:  Acute abdominal series, which was negative for bowel  obstruction.   ASSESSMENT:  This is a 77 year old African American female with the past  medical history of diabetes, hypertension, stroke, presents with 2-day  history of nausea, vomiting.  Because of her history of diabetes, this  could be diabetic gastroparesis.  This could be some kind of  gastroenteritis.  The patient has stump pain, which could be phantom  limb pain.   PLAN:  1. Nausea with vomiting.  We will admit her to the hospital and start      her on IV fluids and symptomatic treatment with antiemetics.  PPIs      will be given IV.  GI will be consulted.  The patient may need an      endoscopy before she can have gastric emptying study, though I do      notice that she had an upper GI endoscopy in 2004 which was done by      Dr. Karilyn Cota, which showed small hiatal hernia and erosive gastritis.      No obstruction was noted at that time, but I will refer again to GI      to order the gastric emptying study.  2. Hypokalemia will be repleted through the intravenous route.  3. Diabetes.  I will hold off on her insulin and the oral agents, put      her on sliding scale and check hemoglobin A1c.  4. Stump pain.  No obvious deformity noted.  We will defer to PMD.  5. Hypertension.  I will put her on Nitro paste while she is still      having vomiting as she will not be able to tolerate her p.o.      agents.  If that does not control the blood pressure, we may have      to utilize parenteral medications.  6. History of stroke, stable.  7. Deep venous thrombosis prophylaxis was initiated.   I will be happy to take care of  this patient on our service through  today and we will be glad to hand over to Dr. Felecia Shelling on  the morning of  July 12th.      Osvaldo Shipper, MD  Electronically Signed     GK/MEDQ  D:  09/22/2008  T:  09/23/2008  Job:  [id number]   cc:   Tesfaye D. Felecia Shelling, MD  Fax: [phone]   Mila Homer. Sudie Bailey, M.D.  Fax: [phone]   R. Roetta Sessions, M.D.  [address]  [place]  [place] [address]

## 2010-07-28 NOTE — Discharge Summary (Signed)
[name], [name]                  ACCOUNT NO.:  [id number]   MEDICAL RECORD NO.:  [id number]          PATIENT TYPE:  INP   LOCATION:  A331                          FACILITY:  [facility]   PHYSICIAN:  Edward L. Juanetta Gosling, M.D.DATE OF BIRTH:  10/16/1933   DATE OF ADMISSION:  11/03/2007  DATE OF DISCHARGE:  08/23/2009LH                               DISCHARGE SUMMARY   REASON FOR ADMISSION:  1. Acute gastroenteritis.  2. Nausea and vomiting secondary to #1.  3. Diabetes.  4. Peripheral arterial disease status post amputation of both legs.  5. Hypertension.  6. Gastroesophageal reflux disease.  7. Hyperlipidemia.  8. Right bundle branch block.  9. History of appendectomy.  10.Dehydration.  11.Hypokalemic.   [name] is a 77 year old patient of Dr. Letitia Neri who came to the  emergency room because of nausea and she had a little bit of coffee-  ground material in the emergency room.  This was heme-positive but it  was a very small amount.  She has not had any other hematemesis, no  hematochezia.  She says that she has been feeling a little bit better.  She has been feeling a little bit better.  She was dehydrated on  admission.  She was hypokalemic on admission.   PHYSICAL EXAM:  VITAL SIGNS:  Showed her blood pressure is 158/82, pulse  92.  She was afebrile, respirations 18.  CHEST:  Her chest was clear.  HEART:  Regular without gallop.  ABDOMEN:  Her abdomen was actually soft.   ASSESSMENT:  Assessment was that she had gastroenteritis and  dehydration, probably trivial hematemesis.  She had serial hemoglobin  and hematocrits which were stable.  She had no further nausea or  vomiting.  She improved greatly and she was discharged home in improved  condition on:   DISCHARGE MEDICATIONS:  1. Potassium chloride 20 mEq twice a day.  2. Actos 30 mg daily.  3. Stool softener daily as needed.  4. Prevacid 30 mg daily.  5. Lovastatin 20 mg daily.  6. Amlodipine 10 mg daily.  7.  Lisinopril/HCT 20/25 b.i.d.  8. Aspirin 81 mg daily.  9. Humulin N 20 units in the morning and 10 units in the evening.   FOLLOWUP:  She will follow up with Dr. Felecia Shelling.      Edward L. Juanetta Gosling, M.D.  Electronically Signed     ELH/MEDQ  D:  11/05/2007  T:  11/05/2007  Job:  [id number]   cc:   Tesfaye D. Felecia Shelling, MD  Fax: [phone]

## 2010-07-28 NOTE — Group Therapy Note (Signed)
[name], [name]                  ACCOUNT NO.:  [id number]   MEDICAL RECORD NO.:  [id number]          PATIENT TYPE:  INP   LOCATION:  A331                          FACILITY:  [facility]   PHYSICIAN:  Edward L. Juanetta Gosling, M.D.DATE OF BIRTH:  1933-07-13   DATE OF PROCEDURE:  DATE OF DISCHARGE:  11/05/2007                                 PROGRESS NOTE   The patient of Dr. Letitia [name].   PROBLEMS:  1. Nausea and vomiting.  2. Hematemesis.   SUBJECTIVE:  [name] says she feels great.  She has no new complaints  and she wants to go home.   Her physical examination today shows that she is awake and alert.  She  is feeling well.  She has had no more nausea.  She has had no vomiting.  She has had no diarrhea, and her exam, otherwise, shows temperature 98,  pulse 76, respirations 20, blood pressure 134/66, and O2 sats 98% on 2  L.   Her hemoglobin level is 11.9 which is stable, although she has had some  bleeding.  I think it is clinically insignificant and my plan is to  discharge her home today.  Please see discharge summary for details.      Edward L. Juanetta Gosling, M.D.  Electronically Signed     ELH/MEDQ  D:  11/05/2007  T:  11/06/2007  Job:  [id number]

## 2010-07-28 NOTE — Group Therapy Note (Signed)
[name], [name]                  ACCOUNT NO.:  [id number]   MEDICAL RECORD NO.:  [id number]          PATIENT TYPE:  INP   LOCATION:  A331                          FACILITY:  [facility]   PHYSICIAN:  Edward L. Juanetta Gosling, M.D.DATE OF BIRTH:  11-11-1933   DATE OF PROCEDURE:  DATE OF DISCHARGE:                                 PROGRESS NOTE   Ms. Ennis was admitted with nausea, vomiting, and felt to have had a  gastroenteritis, but had some blood associated with it.  She has a  history of severe diabetes, gastroesophageal reflux disease, and  hypertension.   Her physical examination this morning shows that she is awake and alert.  She says she feels much better.  She has not nauseated anymore.  She was  able to eat, but she has a sore throat.   Her physical exam and her laboratory examination shows her potassium is  3.2, so she is going to need some potassium.  She does have a  erythematous throat.  Her temperature is 99.1, pulse 76, respirations  18, blood pressure 138/55, and blood sugar 261.  She has not had a CBC  as yet, that is pending.   ASSESSMENT:  She has had nausea and vomiting suggesting gastroenteritis  versus a severe episode of reflux.  She did have some blood mixed with  some of the vomitus, but she has not had anymore bleeding, and I will  see her hemoglobin is stable.  She has diabetes which is not totally  controlled.  She has hypertension, well controlled.  Depending on how  she does, she may need a GI consultation.  We will see what her  hemoglobin levels are or if she has anymore vomiting, etc.      Edward L. Juanetta Gosling, M.D.  Electronically Signed     ELH/MEDQ  D:  11/04/2007  T:  11/04/2007  Job:  [id number]

## 2010-07-28 NOTE — H&P (Signed)
[name], [name]                  ACCOUNT NO.:  [id number]   MEDICAL RECORD NO.:  [id number]          PATIENT TYPE:  INP   LOCATION:  A331                          FACILITY:  [facility]   PHYSICIAN:  Melvyn Novas, MDDATE OF BIRTH:  19-Nov-1933   DATE OF ADMISSION:  11/03/2007  DATE OF DISCHARGE:  LH                              HISTORY & PHYSICAL   HISTORY OF PRESENT ILLNESS:  The patient is a 77 year old black female,  a patient of Dr. Felecia Shelling, who is an insulin dependent diabetic.  Apparently, she has had a 24-hour history of recurrent nausea with  emesis, and she had a little coffee-ground material in the ER in which  she tested heme positive.  She denies any melena, hematemesis, or  hematochezia.  She basically was given something to control her nausea  and felt better.  She had been eating well.  She denies any dizziness,  orthopnea, chest pain, palpitations, dizziness, or syncope.  She is  admitted for gastroenteritis, possible Hemoccult-positive emesis, and  volume depletion along with some hypokalemia and dehydration.   PAST MEDICAL HISTORY:  Significant for;  1. Aforementioned insulin-dependent diabetes.  2. Peripheral arterial disease.  3. Hypertension.  4. GERD.  5. Hyperlipidemia.  6. Right bundle branch block per EKG.   PAST MEDICAL HISTORY:  Remarkable for:  1. Bilateral BKA.  2. Appendectomy.   CURRENT MEDICINES:  1. Lisinopril 20/25 p.o. daily.  2. Prevacid 30 a day.  3. Aspirin 81 mg per day.  4. Actos 30 a day.  5. Mevacor 20 a day.  6. Norvasc 10 a day.  7. KCl 20 mEq p.o. b.i.d.  8. Humulin N 20 units in a.c. breakfast and 10 units a.c. supper.   PHYSICAL EXAMINATION:  VITAL SIGNS:  Blood pressure 158/82, pulse is 92  and regular,  she is afebrile, and respiratory rate is 18.  EYES:  PERRLA.Marland Kitchen  Extraocular movements intact. Sclerae clear.  Conjunctivae pink.  NECK:  Showed no JVD, no carotid bruits, no thyromegaly, and no thyroid  bruits.  LUNGS:  Clear to A & P.  No rales, wheezes, or rhonchi.  HEART:  Regular rhythm.  No murmur, gallops, heaves, thrills, or rubs.  ABDOMEN:  Soft and nontender. Bowel sounds are normoactive.  No  guarding, rebound, mass, or splenomegaly.  EXTREMITIES:  No evidence of pedal edema.  Bilateral BKA  NEUROLOGIC:  Cranial nerves II through XII are grossly intact. The  patient moves all 4 extremities.   IMPRESSION:  1. Gastroenteritis.  2. Dehydration.  3. Insulin-dependent diabetes.  4. Hypokalemia.  5. Hypertension.  6. Hyperlipidemia.  7. Peripheral arterial disease.   PLAN:  To admit and as per orders.      Melvyn Novas, MD  Electronically Signed     RMD/MEDQ  D:  11/03/2007  T:  11/04/2007  Job:  [id number]

## 2010-07-31 NOTE — Discharge Summary (Signed)
   NAME:  [name], [name]                            ACCOUNT NO.:  [id number]   MEDICAL RECORD NO.:  [id number]                   PATIENT TYPE:  INP   LOCATION:  A209                                 FACILITY:  [facility]   PHYSICIAN:  Tesfaye D. Felecia Shelling, M.D.              DATE OF BIRTH:  04-Dec-1933   DATE OF ADMISSION:  11/03/2002  DATE OF DISCHARGE:  11/06/2002                                 DISCHARGE SUMMARY   DISCHARGE DIAGNOSES:  1. Gastrointestinal bleed.  2. Diabetes mellitus type 2.  3. Peripheral vascular disease status post bilateral below-knee amputation.  4. Left stump wound.  5. Hypertension.  6. Chronic obstructive pulmonary disease.  7. Depression disorder.   DISCHARGE MEDICATIONS:  1. Protonix 40 mg p.o. once daily.  2. NPH Insulin 26 units subcu in the morning and 10 units subcu in the     evening.  3. Ascorbic acid 250 mg p.o. once daily.  4. Tenormin 50 mg p.o. once daily.  5. Lotensin 30 mg p.o. once daily.  6. Colace 100 mg p.o. once daily.  7. Folic acid 1 mg p.o. once daily.  8. Hydrochlorothiazide 25 mg p.o. once daily.  9. Niferex 150 mg p.o. once daily.  10.      MiraLax 17 g p.o. once daily.  11.      Multivitamin one tablet p.o. once daily.  12.      Zocor 40 mg p.o. once daily.  13.      KCl 20 mEq p.o. once daily.   DISPOSITION:  The patient will be discharged to [facility] in  stable condition.   HOSPITAL COURSE:  This is a 77 year old black female with a history of  multiple medical illnesses who was admitted from [facility] due to  coffee-grounds vomiting.  She was evaluated in the emergency room where an  NG tube was inserted and the vomitus was drained.  The patient's hemoglobin  and hematocrit remained stable.  GI consult was done.  Endoscopy performed  which showed erosion of the stomach but there was no active bleeding.  The  patient was continued on a proton pump inhibitor.  Over __________ the  patient improved and she is  going to be discharged back to nursing home.                                              Tesfaye D. Felecia Shelling, M.D.   TDF/MEDQ  D:  11/06/2002  T:  11/06/2002  Job:  [id number]

## 2010-07-31 NOTE — Group Therapy Note (Signed)
NAME:  [name], INABINET                            ACCOUNT NO.:  [id number]   MEDICAL RECORD NO.:  [id number]                   PATIENT TYPE:  INP   LOCATION:  A209                                 FACILITY:  [facility]   PHYSICIAN:  Mila Homer. Sudie Bailey, M.D.           DATE OF BIRTH:  1933-03-29   DATE OF PROCEDURE:  DATE OF DISCHARGE:                                   PROGRESS NOTE   SUBJECTIVE:  This 77 year old diabetic was admitted to the hospital last  night with generalized weakness and slurring of speech.   She had been admitted here about three weeks ago with what was felt to be a  viral gastritis.  It was also felt that it might be diabetic gastroparesis.  She was discharged to home on Lotensin, Zocor, Protonix, and Reglan.  Apparently she never had the Reglan prescription filled.  She also stopped  taking her insulin the last few days.   The family says that since she was admitted to the hospital the last time,  she has had slowness in eating and is swallowing okay, however, without  choking.  That has been a definite change since that hospitalization.  She  has had long-term diabetes and had a left BKA secondary to vascular disease  from this.   She feels stronger than last night.  Family is there, including her husband  and daughter, as historians.   She has been fairly weak since her last hospitalization three weeks ago and  has really has not recovered her strength fully.   OBJECTIVE:  VITAL SIGNS:  Temperature 98.4, pulse 89, respiratory rate 20,  blood pressure 142/62.  GENERAL:  She appears to be alert and oriented.  Her response to questions  is somewhat slow, but generally these were all correct.  Her sentence  structure is intact.  There is minimal slurring of speech.  HEENT:  The pharynx is normal.  The uvula and tongue are midline.  The uvula  elevates normally.  NEUROLOGIC:  5/5 grip strength bilaterally.  Negative Babinski response on  the right leg.  She has a  left BKA.  HEART:  Regular rhythm and rate of about 70.  LUNGS:  Clear, moving air well.  SKIN:  Color is good.  ABDOMEN:  Soft without hepatosplenomegaly or masses, no tenderness.   Observing her, she drinks liquids, takes soft solids very well but did choke  some on some eggs one time, but she was able to cough adequately.   LABORATORY DATA:  Today, MET-7 showed a potassium of 3.1.  The last three  glucose measurements were 140, 344, and 384.   ASSESSMENT:  1. Question of cerebrovascular accident.  2. Hypokalemia which may represent total body depletion of potassium even     since her last admission.  3. Type 1 diabetes.  4. Peripheral vascular disease status post left below knee amputation.  5. Probable diabetic  gastroparesis.  6. Essential hypertension.   PLAN:  She is due for carotid Dopplers today.  She will also need another  swallowing study which is already arranged outpatient but has not been done.  Continue with IV fluids and with potassium p.o. and by IV.  Discussed  diabetic gastroparesis, treatment of diabetes, etc., at length with the  patient and her family.   NOTE:  We spent 25 minutes on the coordination of her care.                                               Mila Homer. Sudie Bailey, M.D.    SDK/MEDQ  D:  06/05/2002  T:  06/06/2002  Job:  [id number]

## 2010-07-31 NOTE — Group Therapy Note (Signed)
[name], [name]                  ACCOUNT NO.:  [id number]   MEDICAL RECORD NO.:  [id number]          PATIENT TYPE:  INP   LOCATION:  A336                          FACILITY:  [facility]   PHYSICIAN:  Angus G. Renard Matter, M.D. DATE OF BIRTH:  17-Mar-1933   DATE OF PROCEDURE:  DATE OF DISCHARGE:                                   PROGRESS NOTE   PROGRESS NOTE:  January 05, 2004   SUBJECTIVE:  This patient was admitted with nausea, vomiting, diarrhea which  was thought to be viral gastroenteritis.  She is also a diabetic.   OBJECTIVE:  VITAL SIGNS:  Blood pressure 203/94, respirations 18, pulse 98,  temperature 98.1.  Blood sugars have ranged from 199 to 388.  LUNGS:  Diminished breath sounds.  HEART:  Regular rhythm.  ABDOMEN:  No palpable organs or masses.   ASSESSMENT:  Patient was admitted with acute gastroenteritis and  hypokalemia.  She does have diabetes mellitus, peripheral vascular disease  and hypertension.   PLAN:  Continue her current regimen.      AGM/MEDQ  D:  01/05/2004  T:  01/05/2004  Job:  [id number]

## 2010-07-31 NOTE — Consult Note (Signed)
NAME:  [name], [name]                            ACCOUNT NO.:  [id number]   MEDICAL RECORD NO.:  [id number]                   PATIENT TYPE:  INP   LOCATION:  A209                                 FACILITY:  [facility]   PHYSICIAN:  Lionel December, M.D.                 DATE OF BIRTH:  02-11-1934   DATE OF CONSULTATION:  11/03/2002  DATE OF DISCHARGE:                                   CONSULTATION   CONSULTING PHYSICIAN:  Lionel December, M.D.   REASON FOR CONSULTATION:  Upper GI bleed.   HISTORY OF PRESENT ILLNESS:  [name] is a 77 year old African American  female who was admitted to Dr. Letitia Neri service this morning with an acute  onset of hematemesis and melena.  NG tube was placed in the emergency room  and she had coffee grounds but now it has changed to bilious.  The patient's  H&H on admission was normal.  Dr. Felecia Shelling has requested serial H&Hs.  The  patient is drowsy but she is able to write a history.  She complains of  being nauseated but she denies abdominal pain.  She also denies heartburn,  dysphagia.  She gives a history of constipation.  Her stool, in the  emergency room, is noted to be melenotic and heme positive.  She does give a  history of peptic ulcer disease in the past.  She has not had a good  appetite recently.   MEDICATIONS:  She is on multiple medications which are:  1. Tenormin 50 mg every day.  2. ASA 81 mg every day.  3. Lotensin 30 mg every day.  4. Colace 100 mg b.i.d.  5. Folic acid 1 mg every day.  6. Vitamin C 250 mg every day.  7. HCTZ 25 mg every day.  8. Niferex 150 every day.  9. MiraLax 17 grams every day.  10.      MVI every day.  11.      Protonix 40 mg every day.  12.      KCl 20 mEq every day.  13.      Zocor 40 mg every day.   PAST MEDICAL HISTORY:  1. She has peripheral vascular disease.  2. She had left BKA about three years ago.  3. She had the right BKA on September 21, 2002 for gangrene of her foot.  4. She had a CVA in March 2004 with  minimal weakness to her right upper     extremity.  5. She also has diabetes mellitus.  6. COPD.  7. Hypertension.  8. Hyperlipidemia.  9. She has a history of depression.  10.      In addition to bilateral BKAs, she has had benign lump removed from     her left breast.  11.      Hysterectomy.  12.      She has had a cholecystectomy.  ALLERGIES:  According to the chart reported to be, PENICILLIN.   SOCIAL HISTORY:  She is married.  She has four children.  She worked as a  Lawyer for 12 years.  She used to smoke cigarettes but quit several years ago.  She does not drink alcohol.  She has been staying at [facility] since her right  BKA last month.   FAMILY HISTORY:  Significant for diabetes mellitus in two brothers.   PHYSICAL EXAMINATION:  GENERAL:  A well developed, thin, African American  female who is in no acute distress.  VITAL SIGNS:  She weighs 129.3 pounds, pulse 81, per minute, blood pressure  217/102, respiratory rate is 18 and temp is 99.2.  HEENT:  Conjunctivae is pink.  Sclerae nonicteric.  Oropharyngeal mucosa is  normal.  Dentition is in fair condition.  NECK:  Without masses or thyromegaly.  CARDIAC:  Regular rhythm.  Normal S1, S2.  No murmur or gallop noted.  LUNGS:  Clear to auscultation.  ABDOMEN:  Symmetrical.  Bowel sounds are normal.  Palpation reveals a soft  abdomen without tenderness, organomegaly or masses.  RECTAL:  Deferred.  She had one in the emergency room.  MENTAL STATUS:  She is drowsy but responds appropriately to questions.  EXTREMITIES:  She has a normal-appearing healed left below knee stump.  She  has some edema to her right below knee stump and she still has metallic  clips but there is no discharge noted.   LABS ON ADMISSION:  WBC 17.8.  Her H&H are 13.5 and 40.2.  Platelet count is  346 K.  Her sodium is 137, potassium 2.4, chloride 102, CO2 24, glucose 355,  BUN 12, creatinine 0.6, calcium is 8.8.  Prothrombin time is 14.1, INR is  1.1 and  PTT is 35.   She is receiving KCl with her IV fluids and she has received Protonix 40 mg  IV on admission.   ASSESSMENT:  [name] is a 77 year old African American female with multiple  medical problems who was admitted with upper gastrointestinal bleed.  She  has a normal hemoglobin and hematocrit.  She is on low-dose acetylsalicylic  acid and has a history of peptic ulcer disease.  I suspect we are dealing  with recurrent peptic ulcer disease.   RECOMMENDATIONS:  1. I agree with treating with IV fluids and IV PPI.  2. I agree that NG tube can be removed as the return now is bilious and I     feel she would be comfortable and might     even help her with her blood pressure.  The tube was removed, by the     nursing staff, while I was in the room.  3. Esophagogastroduodenoscopy to be performed in the a.m.   I would like to thank Dr. Felecia Shelling for the opportunity to participate in the  care of this nice lady.                                                Lionel December, M.D.    NR/MEDQ  D:  11/03/2002  T:  11/03/2002  Job:  [id number]

## 2010-07-31 NOTE — Group Therapy Note (Signed)
NAME:  [name], [name]                            ACCOUNT NO.:  [id number]   MEDICAL RECORD NO.:  [id number]                   PATIENT TYPE:  INP   LOCATION:  A312                                 FACILITY:  [facility]   PHYSICIAN:  Mila Homer. Sudie Bailey, M.D.           DATE OF BIRTH:  1934/01/25   DATE OF PROCEDURE:  DATE OF DISCHARGE:                                   PROGRESS NOTE   HISTORY OF PRESENT ILLNESS:  She says she feels about like she did  yesterday.  There are no specific complaints.   MEDICATIONS:  1. Atenolol 50 mg daily.  2. Benazepril 20 mg daily.  3. HCTZ 12.5 mg daily.  4. Kay-Ciel 20 mEq b.i.d. for hypertension control and renal protection.  5. Protonix 40 mg daily.  6. Reglan 5 q.a.c. and q.h.s. for diabetic gastroparesis.  7. ASA 325 mg daily.  8. Plavix 75 mg daily for stroke prevention.  9. Zocor 4 mg daily for hypercholesterolemia.  10.      Paroxetine 20 mg daily for depression.  11.      Insulin 70/30 20 at 6 a.m. and 20 at 6 p.m. for insulin-dependent     diabetes.  12.      Folic acid 1 mg daily.  13.      Multivitamins daily.   PHYSICAL EXAMINATION:  GENERAL APPEARANCE:  She is sitting semirecumbent in  bed this morning.  She is in no acute distress.  It appears of a depressed  affect.  She is well-developed, well-nourished.  She is a left BKA.  She  appears to be oriented and alert.  Glucose has been 68 and 123 most  recently.  LUNGS:  Clear without moving air.  HEART:  Irregular rhythm rate of about 70.  ABDOMEN:  Soft without hepatosplenomegaly or mass.  EXTREMITIES:  Right foot shows no edema.  There is thickening and yellowing  of the nail beds with detritus underneath the nail plates.  The right arm  appears to be swollen and edematous with a woody consistency to it extending  from the hand and wrist all the way to the shoulder.   ASSESSMENT:  1. Escherichia coli urinary tract infection.  2. Dehydration.  3. Question deep venous thrombosis of  the right arm.  4. Insulin-dependent diabetes.  5. Diabetic gastroparesis.  6. Hypercholesterolemia.  7. Depression.  8. Hypertension.  9. Peripheral vascular disease status post left below-the-knee amputation.   PLAN:  She is due for ultrasound of the right arm today.  Did discuss the  best radiological technique with Dr. Loraine Leriche __________ , Radiology, last  night.  So, this was planned today.  At this point, discontinue the Plavix.  Continue ASA.  Add Lovenox 1 mg/kg subcu q.12h.  Will continue with her  insulin which is really controlling her sugars excellently, even though she  has had an infection.  Mila Homer. Sudie Bailey, M.D.    SDK/MEDQ  D:  06/14/2002  T:  06/15/2002  Job:  [id number]

## 2010-07-31 NOTE — Discharge Summary (Signed)
NAME:  [name], LACOMBE                            ACCOUNT NO.:  [id number]   MEDICAL RECORD NO.:  [id number]                   PATIENT TYPE:  INP   LOCATION:  A312                                 FACILITY:  [facility]   PHYSICIAN:  Mila Homer. Sudie Bailey, M.D.           DATE OF BIRTH:  03-20-1933   DATE OF ADMISSION:  06/04/2002  DATE OF DISCHARGE:  06/26/2002                                 DISCHARGE SUMMARY   HISTORY OF PRESENT ILLNESS:  This 77 year old woman was admitted to the  hospital on 06/04/2002, discharged 06/26/2002.  She had a relatively benign  23-day hospital course.   She was initially discharged on 06/11/2002 but could not urinate after a  Foley cath was removed, was there the following day, and had more problems  with confusion and swelling of the right arm, so discharge was cancelled,  and she was continued in the hospital.  There was a discharge summary done  on 06/10/2002, however.  Further, she was discharged about five days ago by  Dr. Ouida Sills, covering for me, but, again, it was felt she could not tolerate  discharge, so this was rescinded, and she stayed in the hospital.   Essentially, she was admitted to the hospital with what was an E. coli UTI.  She also had totally uncontrolled diabetes and dehydration.  She was  rehydrated, put on double-dose antibiotics, Rocephin and Levaquin, to  control her urinary tract infection, and after an adequate course of 10 days  of treatment, that in fact was controlled.   Her diabetes was very well controlled initially with IV fluids and insulin,  and eventually it was excellently controlled just on Humulin N 26 units in  the a.m. and 20 units at 6 p.m.  Sugars ran between 100 and 200s but tended  to stay on the low 100s side.   Concerning her hypertension, she had a variety of antihypertensives used,  but finally pressure was well controlled on atenolol 50 mg q.d. with  benazepril 20 mg q.d., HCTZ 12.5 mg q.d., and Kay Ciel 12 mEq  q.d.  She was  discharged on this.   Concerning her confusion, this was initially felt to be secondary to anti-  anxiety drugs and antidepressants.  She developed more confusion her second  week, one or two days after she was due to be discharged the first time.  Initially, this was felt to be secondary to the Paxil that was used, so we  switched her to Wellbutrin SR 150 mg b.i.d.  Confusion stayed about the same  with this.  Blood gases were also checked, and it was found that she had a  PO2 of only 45.  It was felt that this contributed to her confusion due to  an O2 sat of only 78%.  She seemed to respond somewhat by going on oxygen  but still continued to be confused, even with cutting out  all anti-anxiety  agents and even cutting out her Reglan that she had been put on for diabetic  gastroparesis.   When her confusion persisted, she had a further workup, including MRI of the  brain.  This did show small acute infarctions of the left thalamus, left  mid brain, right centrum semiovale.  She also had MR angiogram of circle of  Willis; this showed some mild irregularities in the M1 segment of the  anterior cerebral artery, no cerebral arteries bilaterally, and slight  irregularity with a cavernous segment of the right internal carotid artery.  Only the distal portion of the right vertebral artery was visualized.  There  was no obvious aneurysm, but there was a small amount of hemorrhage seen  within the infarct in the posterior aspect of the left __________ nucleus,  posterior limb of the left internal capsule.   She was also seen by Dr. Gerilyn Pilgrim, neurologist, for this.   When she stayed confused and lethargic, I started her on theophylline 300 mg  b.i.d., and within one day, she was wakeful and responding to family and  staff.  It was felt that her posterior circulation infarcts may have  affected her reticular activating system, and therefore theophylline may  have helped wake her  up with this.  (This is speculative; however, response  was noted within one day in a woman who had been confused for over one week  before this.)   Concerning her hypercholesterolemia, she was continued on her Simvastatin 40  mg q.d.   Concerning her right arm swelling, this developed after the first week of  hospitalization.  It really was not apparent at the time of her first  discharge, but by the following day, it was more apparent.  The entire right  arm and forearm were swollen, almost wooden; she was unable to move it.  It  was initially felt this was just extravasation of IV fluids from her  antecubital IV in the right side, but then after several days it did not  clear, and I noted extensive bruising of the medial aspect of the right arm  and the dorsal aspect of the right forearm; I felt she just had bruising in  the arm due to anticoagulation and possibly a broken vein.  When this itself  did not clear, then I had an ultrasound of the right arm done, after  consultation with radiology, and it was felt by them that she did not have a  DVT of the arm.  It was thought this might be secondary to stroke, but, in  retrospect, with the fluid gradually clearing from the arm and her being  able to extend it more each day and now to about 120 degrees, I feel that  she just had extensive bleeding to the right arm, which took a while to  clear up.   In the hospital, a full workup was done by Dr. Corinda Gubler of cardiology.  Based  on this, it was felt that she did not have any acute cardiac problems.   Patient was found to be hypokalemic and was treated with potassium, and this  cleared.   Came with a hemoglobin of 14 while she was very dehydrated.  Hemoglobin  dropped to 12.2 and then to 8.1.  During her last week, this was followed  and came up to 9.0 and then rechecked at 8.6.  It was finally felt that this probably represents anemia of chronic disease.  Initial drop was just  secondary to her being rehydrated with IV fluids.   She was also on ASA 325 mg q.d. and Plavix 75 mg q.d.,and when it was felt  that she may have had a stroke, Plavix was DC'd, and she was put on Lovenox  70 mg subcu q.d.  She was also continued on Protonix 40 mg q.d. for  prevention of GI stress ulcers.  She was on multivitamins q.d. and folic  acid q.d. for nutritional support, given that her appetite was not good  initially.   Essentially, the first week of the hospitalization was fighting the  dehydration, poorly controlled diabetes, E. coli, and UTI.  The second week  of the hospitalization was diagnosing and managing her confusion, which  seemed to be secondary to CVAs.  The third week of the hospitalization was  increasing her strength levels, having PT ambulate her, and getting to the  point where she could be discharged.   Arrangements were made for her transfer to [facility]  rehab unit on her 23rd day.  It was recommended that she continue her  atenolol 50 mg q.d., benazepril 20 mg q.d., HCTZ 12.5 mg q.d., Kay Ciel 20  mEq q.d., Simvastatin 40 mg q.d., ASA 325 mg q.d., Lovenox 70 mg subcu q.d.,  Protonix 40 mg q.d., folic acid 1 mg q.d., and multivitamin q.d. as well as  her Humulin N 26 units in the morning and 20 units at 6 p.m.  Once she is  more mobile, Lovenox can be DC'd, and she can probably go back to Plavix 75  mg q.d., but this can be decided by rehab and neurology at [facility].   FINAL DISCHARGE DIAGNOSES:  1. Escherichia coli urinary tract infection.  2. Dehydration.  3. Poorly controlled type 1 diabetes.  4. Cerebrovascular accidents, three are recent.  5. Essential hypertension.  6. Peripheral vascular disease, status post left lower leg amputation.  7. Hypercholesterolemia.  8. Diabetic gastroparesis, currently stable, off medication.  9. Depression.  10.      Hypokalemia.  11.      Anemia of chronic disease.                                                Mila Homer. Sudie Bailey, M.D.    SDK/MEDQ  D:  06/26/2002  T:  06/26/2002  Job:  [id number]

## 2010-07-31 NOTE — Consult Note (Signed)
NAME:  [name], [name]                            ACCOUNT NO.:  [id number]   MEDICAL RECORD NO.:  [id number]                   PATIENT TYPE:  IPS   LOCATION:  4031                                 FACILITY:  [facility]   PHYSICIAN:  Di Kindle. Edilia Bo, M.D.        DATE OF BIRTH:  [birth year]   DATE OF CONSULTATION:  07/10/2002  DATE OF DISCHARGE:                                   CONSULTATION   REASON FOR CONSULTATION:  Ischemic wounds of the right lower extremity.   HISTORY:  This is a pleasant 77 year old woman who was admitted to rehab on  June 26, 2002.  She had had multiple admissions in the past month for  various problems including a urinary tract infection, dehydration, and  general failure to thrive.  On this admission, the major complaint was  generalized weakness with slurred speech and right-sided weakness.  She  underwent an MRA which showed a small acute infarction on the left.  Of  note, carotid Doppler studies showed no significant stenosis on either side.  She has been transferred to the rehab unit and we were consulted concerning  a pressure sore on her right foot.  Of note, according to the patient, she  underwent a left lower extremity bypass by Dr. Arbie Cookey in the past.  She also  required a left below-the-knee amputation.  Records from the office are  currently pending.  She states that she has had the wound on her right foot  for two months.  She believes it was related to a sock or shoe that she had  that was too tight, at least on the dorsum of the foot.  The heel ulcer most  likely represents a pressure sore.  These wounds have shown no evidence of  healing and vascular surgery was consulted for further recommendations.   PAST MEDICAL HISTORY:  Significant for insulin-dependent diabetes.  According to her records, she has been poorly compliant with this.  She also  has a history of hypertension.  She reportedly has a gastroparesis  associated with her  diabetes.   She denies any history of  hypercholesterolemia or history of significant coronary artery disease.  According to the records, her most recent echocardiogram showed an ejection  fraction of 55% with a sclerotic aortic valve.   PAST SURGICAL HISTORY:  Significant for a breast lumpectomy in 1984.  She  has also had a hysterectomy and cholecystectomy.   FAMILY HISTORY:  There is no history of premature cardiovascular disease.  There is a history of diabetes in the family.   SOCIAL HISTORY:  She lives with her husband.  She has two children.  She  quit tobacco 40 years ago.   REVIEW OF SYSTEMS:  She has had no recent chest pain or shortness of breath.  She has had no fever or chills.  She has had no recent change in her bowel  habits and has no  history of peptic ulcer disease.  She gives no history of  previous DVT or phlebitis.  Review of systems is otherwise as documented in  her history and physical.   PHYSICAL EXAMINATION:  VITAL SIGNS:  Temperature 98, blood pressure 142/72,  heart rate of 74.  I did not detect any carotid bruits.  LUNGS:  Clear bilaterally to auscultation.  CARDIAC:  She has a regular rate and rhythm.  ABDOMEN:  Soft and nontender.  I could not palpate an aneurysm.  EXTREMITIES:  She has normal femoral pulses bilaterally.  I cannot palpate  popliteal or pedal pulses in the right side.  On the left side, she has a  below-the-knee amputation.  She has monophasic signals in her peroneal,  dorsalis pedis, and posterior tibial arteries in the right foot.  She has a  large pressure sore on the dorsum of the right foot at the crease and also a  pressure sore on the heel.  There is some mild cellulitis on the dorsum of  the foot.  She has mild lower extremity swelling and some evidence of  chronic venous insufficiency on the right.   IMPRESSION:  This patient is a 77 year old who presents with a two-month  history of pressure sore in the right heel and dorsum of the right  foot.  On  exam, she has evidence of infrainguinal arterial occlusive disease.  Without  revascularization, these wounds will almost certainly progress and she would  require primary amputation, either a below-the-knee amputation or above-the-  knee amputation.  Her only chance for limb salvage would be to proceed with  an arteriogram to see what her options were for revascularization.  If she  were a candidate for bypass, however, even with successful revascularization  of the right foot, the foot itself may not be salvageable because of the  extensive nature of these wounds.  She would also be at increased risk for  surgery because of her multiple medical problems including diabetes,  hypertension, and her recent stroke.  This patient is known to Dr. Arbie Cookey,  who has taken care of her peripheral vascular disease in the past.  The best  option may be to proceed with an arteriogram to at least see what our  options are for revascularization.  I will discuss this with Dr. Arbie Cookey, who  will then follow up with the patient.   Thanks for allowing [place] to participate in this nice patient's care.                                               Di Kindle. Edilia Bo, M.D.    CSD/MEDQ  D:  07/10/2002  T:  07/10/2002  Job:  [id number]

## 2010-07-31 NOTE — Procedures (Signed)
   NAME:  [name], [name]                            ACCOUNT NO.:  [id number]   MEDICAL RECORD NO.:  [id number]                   PATIENT TYPE:  INP   LOCATION:  A209                                 FACILITY:  [facility]   PHYSICIAN:  Vida Roller, M.D.                DATE OF BIRTH:  November 19, 1933   DATE OF PROCEDURE:  06/05/2002  DATE OF DISCHARGE:                                  ECHOCARDIOGRAM   TAPE #:  [id number].   TAPE COUNT:  [id number].   REASON FOR CONSULTATION:  This is a 77 year old woman with a CVA with  diabetes, hypertension.  The technical quality of the study is adequate.   M-MODE MEASUREMENTS:  The aorta is 28 mm.   The left atrium is 40 mm.   The septum is 15 mm, which is enlarged.   The posterior wall is 13 mm, which is enlarged.   Left ventricular diastolic dimension is 41 mm.   The ventricular systolic dimension is 26 mm.   2-D AND DOPPLER IMAGING:  The left ventricle is normal sized with low normal  left ventricular ejection fraction estimated at 50-55%, diastolic function  was not assessed.  There is concentric left ventricular hypertrophy.  No  obvious wall motion abnormalities were seen.   The right ventricle is normal sized with normal systolic function.  No wall  motion abnormalities are seen.   Both atria appear to be top normal in size.  There is no obvious atrial  septal defect.   The aortic valve is severely sclerotic with evidence of impaired leaflet  motion.  There is no hemodynamically significant aortic stenosis.  The  outflow gradient is measured only at 10 mmHg.  There is trace to mild aortic  insufficiency.   The mitral valve is severely calcified with a fixed posterior leaflet.  The  anterior leaflet appears to move normally and there is no significant mitral  stenosis.  There is mild mitral regurgitation.   The tricuspid valve is morphologically unremarkable with trace to mild  insufficiency, no stenosis is seen.   The pulmonic valve is  not well seen.   The ascending aorta looks enlarged.  There is evidence of atherosclerotic  fixed disease.  No mobile atheroma is seen.   The pericardial structures appear normal.                                               Vida Roller, M.D.    JH/MEDQ  D:  06/05/2002  T:  06/06/2002  Job:  [id number]

## 2010-07-31 NOTE — H&P (Signed)
NAME:  [name], [name]                            ACCOUNT NO.:  [id number]   MEDICAL RECORD NO.:  [id number]                   PATIENT TYPE:  EMS   LOCATION:  ED                                   FACILITY:  [facility]   PHYSICIAN:  Burnice Logan, M.D.               DATE OF BIRTH:  1933/09/18   DATE OF ADMISSION:  05/17/2002  DATE OF DISCHARGE:                                HISTORY & PHYSICAL   PRIMARY PHYSICIAN:  Dr. John Giovanni.   CHIEF COMPLAINT:  Vomiting since morning.   HISTORY OF PRESENT ILLNESS:  The patient is a 77 year old African-American  lady who came to the emergency room with her husband complaining of vomiting  all day.  She has had nothing to eat because of her vomiting.  She was  observed to vomit a couple of times in the emergency room.  There is no  hematemesis.  She also denies having any diarrhea or abdominal pain with her  symptoms.  She says she has just felt sick all day and has not been able to  take any of her medications including her insulin.  Laboratory work showed  her glucose to be elevated at 554 in the emergency room.  She was given  Phenergan in the emergency room for her vomiting and started on IV fluids.   PAST MEDICAL HISTORY:  1. Diabetes mellitus for the past 25 years.  2. Hypertension.  3. Left AKA in 2001.  4. Varicose vein surgery.  5. Status post cholecystectomy.  6. Status post hysterectomy.   MEDICATION ALLERGIES:  PENICILLIN.   CURRENT MEDICATIONS:  1. NPH Insulin 20 units b.i.d.  2. Accupril.  3. Aspirin.   FAMILY HISTORY:  Notable for diabetes.   SOCIAL HISTORY:  The patient is married.  Currently on disability.  She does  not smoke.  She denies use of alcohol.   REVIEW OF SYSTEMS:  The patient has had no fever or chills.  NEUROLOGIC:  Denies any headaches.  No changes in her vision.  CARDIOVASCULAR:  Has no  cough, no sputum production, no chest pain.  GASTROINTESTINAL:  Please refer  to history of present illness.   GENITOURINARY:  Denies any dysuria or  urinary frequency.  MUSCULOSKELETAL:  Has no joint swelling or pain.  All  other systems reviewed and were negative.   PHYSICAL EXAMINATION:  GENERAL:  Lethargic African-American lady lying  supine in bed.  Feels uncomfortable because of intractable vomiting.  VITAL SIGNS:  Temperature 98, blood pressure 172/65, heart rate 102,  respiratory rate 16 per minute.  HEENT:  Normocephalic, atraumatic.  Symmetrical pupils.  Extraocular  movements full and intact.  Oropharynx is unremarkable.  Tongue is moist.  NECK:  Supple.  No JVD.  LUNGS:  Clear to auscultation.  Breathing is unlabored.  HEART:  Heart sounds 1 and 2 were heard and regular.  ABDOMEN:  Soft and  nontender to palpation.  There is no organomegaly.  Bowel  sounds are heard.  CNS:  The patient is drowsy but arousable.  She is alert and oriented x 3  and answers questions appropriately.  She moves all extremities equally.  EXTREMITIES:  She has no pedal edema.  She has a left AKA with prosthesis.   LABORATORY DATA:  Chest x-ray:  No acute infiltrates.  Abdominal x-ray shows  nonspecific gas pattern.   WBC 16.1, hemoglobin 16, hematocrit 50, platelet count 316.  Serum glucose  554, sodium 137, potassium 3, BUN 21, creatinine 1.3, bicarbonate 32,  amylase 39, lipase 20.  Urinalysis shows specific gravity 1.015, pH of 5,  glucose greater than 1000, and ketonuria.   ASSESSMENT AND PLAN:  1. Intractable vomiting:  The patient likely has gastritis, the etiology of     which is unclear.  She will be admitted, treated conservatively with     bowel rest, IV fluids, and Protonix IV.  Will also give her Phenergan as     needed to control her symptoms.  She will be allowed clear sips of water     and started on an ADA diet if her symptoms resolved.  Consideration must     be given to a GI consultation if her symptoms persist.  2. Uncontrolled diabetes mellitus:  The patient has not had any insulin      today.  Her last insulin dose was more than 24 hours ago.  Will manage     her with a sliding scale and resume her usual NPH when she resumes her     normal diet.  3. Hypokalemia, which was repeated.  4. Leukocytosis, likely a stress reaction or demargination:  The patient     remains afebrile, with no focus of infection.  Will repeat her CBC.  5. Hypertension, which is stable:  Will resume Accupril at 10 mg b.i.d.  Her     usual dose of Accupril needs to be confirmed and restarted.   Dr. Sudie Bailey will resume care of the patient in the morning.  Encounter  approximately 60 minutes.                                               Burnice Logan, M.D.    ES/MEDQ  D:  05/17/2002  T:  05/17/2002  Job:  [id number]

## 2010-07-31 NOTE — Op Note (Signed)
[place]  Patient:    [name], [name] Visit Number: [id number] MRN: [id number]          Service Type: DSU Location: [id number] Attending Physician:  Bertrum Sol Dictated by:   Beulah Gandy. Ashley Royalty, M.D. Proc. Date: 08/22/01 Admit Date:  08/22/2001 Discharge Date: 08/23/2001                             Operative Report  DATE OF BIRTH:  06-03-1933  PREOPERATIVE DIAGNOSIS:  Proliferative diabetic retinopathy and dense cataract in the right eye.  POSTOPERATIVE DIAGNOSIS:  Proliferative diabetic retinopathy and dense cataract in the right eye.  OPERATION PERFORMED:  Pars plana vitrectomy, retinal photocoagulation, membrane peel in the right eye.  SURGEON:  Beulah Gandy. Ashley Royalty, M.D.  ASSISTANT:  Alma Downs, P.A.  ANESTHESIA:  General.  DESCRIPTION OF PROCEDURE:  Usual prep and drape.  Peritomies at 8, 10 and 2 oclock.  A 4 mm angled infusion port anchored into place at 8 oclock.  The lighted pick and the cutter were placed at 10 and 2 oclock respectively.  The contact lens ring anchored into place at 6 and 12 oclock.  A flat contact lens was placed onto a layer of methylcellulose and onto the cornea.  The pars plana vitrectomy was begun just behind the cataractous lens.  Dense blood and vitreous was encountered.  This was carefully removed under low suction and rapid cutting down to the macular surface.  Blood was vacuumed from the macular surface.  Some hard exudates were seen. A traction membrane was seen on the disk.  The serrated forceps were used to engage this membrane and "free it from its attachment to the disk".  The vitrectomy was carried out to the far peripheral vitreous space with a 30 degree prismatic viewing system. Scleral depression was used to  gain access to the vitreous base.  The vitreous base was trimmed for 360 degrees.  Once all blood was removed from the eye, the endolaser was positioned in the eye.   921 burns were placed around the retinal periphery with a power of 400 milliwatts, 1000 microns each and 0.1 seconds each.  A washout procedure was performed and the [employer] [facility] brush was used to vacuum additional blood from the surface of the retina.  The instruments were removed from the eye and 9-0 nylon was used to close the sclerotomy sites.  The conjunctiva was closed with wet field cautery.  These sites were tested and found to be tight with Weck-cel sponge. Polymyxin and gentamicin were irrigated into Tenons space.  Atropine solution was applied.  Marcaine was injected around the globe for postoperative pain. Decadron 10 mg was injected into the lower fornix.  Closing tension was 10 with a Barraquer tonometer.  Polysporin, a patch and shield were placed.  The patient was awakened and taken to recovery in satisfactory condition. Dictated by:   Beulah Gandy. Ashley Royalty, M.D. Attending Physician:  Bertrum Sol DD:  08/22/01 TD:  08/24/01 Job: [id number]

## 2010-07-31 NOTE — Discharge Summary (Signed)
[place]  Patient:    [name], [name]                         MRN: [id number] Adm. Date:  [id number] Disc. Date: 01/27/00 Attending:  Evern Core Dictator:   Sara Rossetti. Angiulli, P.A. CC:         [name], M.D.  Dr. Sudie [name], [address], [place], [place]   Discharge Summary  DISCHARGE DIAGNOSES: 1. Left below-knee amputation. 2. Postoperative anemia. 3. Peripheral vascular disease. 4. Insulin-dependent diabetes mellitus. 5. Hypertension. 6. History of skin melanoma.  HISTORY OF PRESENT ILLNESS:  A 77 year old female admitted January 15, 2000, with multiple revascularization procedures, revision of left femoral tibial bypass graft October 7, per Dr. Arbie [name].  Ischemic changes, and limb could not be salvaged.  Underwent a left below-knee amputation November 2, per Dr. Arbie [name].  Postoperative anemia 9.5.  Pain management ongoing.  No chest pain or shortness of breath.  Blood sugars with some increased variables.  Minimal assist ambulation.  Latest chemistries unremarkable with hemoglobin 9.5 on January 16, 2000.  An EKG was not listed.  She was admitted for comprehensive rehab program.  PAST MEDICAL HISTORY:  See discharge diagnoses.  PAST SURGICAL HISTORY: 1. Left breast lumpectomy in 1984. 2. Hysterectomy. 3. Cholecystectomy.  ALLERGIES:  None.  HABITS:  Remote smoker.  No alcohol.  PRIMARY MEDICAL DOCTOR:  Dr. Sudie [name] of [place].  MEDICATIONS PRIOR TO ADMISSION: 1. Humulin N 25 units in a.m., 8 units in p.m. 2. Picotrin daily. 3. Multiple vitamin. 4. Accupril 20 mg twice daily.  SOCIAL HISTORY:  Lives with husband and two children in [place]. Independent with a walker prior to admission, one level home, two steps to entry, family works days.  HOSPITAL COURSE:  The patient did well while on rehabilitation services with therapies initiated on a b.i.d. basis.  The following issues were followed during  the patients rehab course.  Pertaining to [name] left below-knee amputation, surgical site healing nicely, staples intact.  She had scant serosanguineous drainage; no odor noted.  Incision site was well approximated. The plan will be to remove staples as per CVTS Dr. Arbie [name].  Follow up with amputee clinic for stump shrink and preprostatic training.  Postoperative anemia was stable with latest hemoglobin 10.1, hematocrit 30.1.  Her diabetes mellitus was well controlled with insulin Humulin NPH 25 units in the a.m. and 8 units in the p.m.  This was her home regimen.  Blood pressure controlled with Accupril.  She had no bowel or bladder disturbances.  She was ambulating household functional distances with a walker, essentially independent standby assist in all areas of activities of daily living with dressing, grooming, and homemaking.  Her strength and endurance greatly improved.  She would receive home health and physical and occupational therapy.  She was discharged to home.  LABORATORY:  Latest labs showed sodium 139, potassium 3.2, BUN 11, creatinine 0.8.  Liver function studies within normal limits except an SGOT of 106, hemoglobin 10.1, hematocrit 30.1.  DISCHARGE MEDICATIONS: 1. Ecotrin 325 mg daily. 2. Zestril 20 mg twice daily. 3. Insulin Humulin NPH 25 units in the a.m., 8 units in the p.m. 4. Protonix 40 mg daily. 5. Tylox as needed for pain.  ACTIVITY:  As tolerated.  DIET:  1800 calorie ADA.  WOUND CARE:  Follow up with Dr. Arbie [name] in 7-10 days to remove staples.  No driving.  Home health physical  and occupational therapy as well as a home health nurse to check wound.  FOLLOW-UP:  Follow up with Dr. Johna [name], preprostatic amputee clinic as advised.  Dr. August [name], medical management. DD:  01/26/00 TD:  01/26/00 Job: [id number]

## 2010-07-31 NOTE — Discharge Summary (Signed)
NAME:  [name], [name]                            ACCOUNT NO.:  [id number]   MEDICAL RECORD NO.:  [id number]                   PATIENT TYPE:  IPS   LOCATION:  4031                                 FACILITY:  [facility]   PHYSICIAN:  Erick Colace, M.D.           DATE OF BIRTH:  01-22-1934   DATE OF ADMISSION:  06/26/2002  DATE OF DISCHARGE:  07/18/2002                                 DISCHARGE SUMMARY   DISCHARGE DIAGNOSES:  1. Left cerebrovascular accident.  2. Subcutaneous Lovenox for deep venous thrombosis prophylaxis.  3. Insulin-dependent diabetes mellitus.  4. Right heel ulcer with peripheral vascular disease.  5. Diabetic gastroparesis.  6. Depression.  7. Hyperlipidemia.  8. History of left below-knee amputation, November 2001, secondary to     peripheral vascular disease.  9. Chronic obstructive pulmonary disease.  10.      Chronic anemia.  11.      Hypertension.   HISTORY OF PRESENT ILLNESS:  This is a 77 year old female, multiple medical,  with insulin-dependent diabetes mellitus, severe peripheral vascular disease  with left below-knee amputation, November 2001, of which she received  inpatient rehab services for.  Multiple admissions over the past previous  month for urinary tract infection, dehydration, failure to thrive, now  admitted to [facility], June 04, 2002, with generalized weakness,  slurred speech, right-sided weakness.  Upon evaluation, cranial  CT scan,  June 04, 2002, with small, age-indeterminate lacunar infarction just medial  to the genu of the right internal capsule.  Carotid Duplex with bilateral  plaque but no significant stenosis.  MRI with small acute infarction within  the left thalamus, left mid-brain and right centrum semiovale.  EEG showed  no seizure, placed on aspirin and subcutaneous Lovenox.  Echocardiogram with  ejection fraction of 55% and a sclerotic aortic valve.  Bedside swallow  evaluation tolerated all consistencies.   Required moderate assist for sit to  stand, ambulated with minimal assistance using prosthesis.  Latest  chemistries with hemoglobin 8.6, BUN 20, creatinine 0.9.  Hemoglobin A1c as  of June 05, 2002 was 15.2, admitted for comprehensive rehab program.   PAST MEDICAL HISTORY:  See discharge diagnoses.   PAST SURGICAL HISTORY:  Benign breast lumpectomy, hysterectomy,  cholecystectomy.   ALLERGIES:  PENICILLIN.   HABITS:  Remote smoker.  No alcohol.   MEDICATIONS PRIOR TO ADMISSION:  1. NPH insulin 20 units twice daily.  2. Protonix 40 mg daily.  3. Lotensin 20 mg daily.  4. Hydrochlorothiazide 12.5 mg daily.  5. Zocor 40 mg daily.   PRIMARY MEDICAL DOCTOR:  Dr. Mila Homer. [place] [place].   SOCIAL HISTORY:  Lives with husband and two children, independent prior to  admission, used a walker at times as well as prosthesis.  One-level home,  two steps to entry.  Family works day shift.   HOSPITAL COURSE:  Patient with progressive gains while in rehab services,  with therapies initiated on a twice-day basis.  The following issues were  followed during the patient's rehab course:  Pertaining to the patient's  left cerebrovascular accident, maintained on aspirin and Plavix for CVA  prophylaxis.  Her functional mobility continued to improve, as she was  ambulating close supervision with a rolling walker 200 feet, close  supervision for upper and lower body dressing, close supervision for  transfers bed to chair.  Home health therapies would be ongoing with  physical and occupational therapy.  She was maintained on subcutaneous  Lovenox throughout her rehab course.  Blood sugars had some variables; her  NPH insulin was adjusted; latest blood sugars -- 181 and 112.  She received  full diabetic teaching.  It was noted latest hemoglobin A1c on June 05, 2002 was 15.2; it was stressed the need to be compliant with diet.  Throughout her rehab course, noted right heel ulcer with  ischemic changes;  it was advised skin care, to keep heel elevated off bed.  She had seen Dr.  Kristen Loader. Early of cardiothoracic surgery in the past for a left below-knee  amputation.  She received followup to cardiothoracic surgery for advanced  right foot changes, showing evidence of arterial occlusive disease without  revascularization.  It was felt that these wounds would progress to require  amputation.  The best option at that time, per cardiothoracic surgery, was  to maintain on antibiotic therapy, conservative care, followup outpatient  with Dr. Arbie Cookey, plan for surgery as needed.  Her blood pressures remained  controlled during her rehab course on Lotensin.  Tenormin had been added  while at [facility] and also hydrochlorothiazide was ongoing.  She  had a history of diabetic gastroparesis; she continued on her Protonix as  prior to hospital admission.  Theo-Dur had been added to her regimen for  chronic obstructive pulmonary disease; she was a remote smoker; oxygen  saturations were 94% on room air.  Chronic anemia noted, with latest  hemoglobin 9.5, hematocrit 27.8; she continued on iron supplement.  During  her rehab course, she was treated for an Enterococcus urinary tract  infection, remaining afebrile and monitored.  Followup urinalysis study was  negative.   Latest labs showed a hemoglobin 9.5, hematocrit 27.8; sodium 137, potassium  3.7, BUN 18, creatinine 1.0.   DISCHARGE MEDICATIONS:  1. Trinsicon twice daily.  2. Ecotrin 325 mg daily.  3. Tenormin 50 mg daily.  4. Lotensin 20 mg daily.  5. Foltx daily.  6. Protonix 40 mg daily.  7. Theo-Dur 300 mg twice daily.  8. Zocor 40 mg daily.  9. Plavix 75 mg daily.  10.      Potassium chloride.  Patient to follow up with Dr. Sudie Bailey for     consideration of ongoing monitoring of potassium.  11.      Hydrochlorothiazide 12.5 mg daily.  12.      NPH insulin 25 units daily.  13.      Tylenol as needed.  ACTIVITY:   Activity as tolerated.   DIET:  Diet was diabetic diet.   WOUND CARE AND FOLLOWUP:  Keep heel elevated.  Home health nurse to be  arranged for wound care and patient to follow up with Dr. Arbie Cookey of  cardiothoracic surgery for consideration of surgery.  She would see Dr  .Erick Colace at the [facility] as advised,  Dr. Sudie Bailey of [facility] to continue to follow medically.     Reuel Boom  Angiulli, P.A.                     Erick Colace, M.D.    DA/MEDQ  D:  07/17/2002  T:  07/17/2002  Job:  [id number]   cc:   Larina Earthly, M.D.  [address]  [place]  [place] [address]  Fax: [phone]   Mila Homer. Sudie Bailey, M.D.  [address] [place], [place] [address]  Fax: [phone]

## 2010-07-31 NOTE — Op Note (Signed)
[place]. [facility]  Patient:    [name], [name]                         MRN: [id number] Proc. Date: 01/15/00 Adm. Date:  [id number] Attending:  Alyson Locket                           Operative Report  PREOPERATIVE DIAGNOSIS:  Gangrene, left foot.  POSTOPERATIVE DIAGNOSIS:  Gangrene, left foot.  PROCEDURE:  Left below knee amputation.  SURGEON:  Larina Earthly, M.D.  ASSISTANT:  Areta Haber, P.A.-C.  ANESTHESIA:  General endotracheal.  COMPLICATIONS:  None.  DISPOSITION:  To recovery room stable.  DESCRIPTION OF PROCEDURE:  The patient was taken to the operating room and placed in the position where the area of the left leg was prepped and draped in the usual sterile fashion.  Using a posterior based muscle flap, an incision was made below the level of the tibial prominence.  This was carried down through the anterior tibial muscle ______ with electrocautery.  The anterior tibial neurovascular bundle was ligated with 0 Vicryl ties and divided.  Next, the soleus muscle was divided in line with the skin incision.  The gastrocnemius muscle was left in place with the posterior flap.  The periosteum was elevated off of the tibia and the tibia was divided with the Gigli saw.  The periosteum was elevated further proximally on the fibula, and this was divided slightly further proximally with bone shears.  The popliteal artery and vein were ligated with 0 Vicryl ties and divided.  The specimen was passed off the field.  The wounds were irrigated with saline and hemostasis with electrocautery.  The bone rasp was used to smooth the edge of the tibia.  The posterior fascia was closed to the anterior fascia with 0 Vicryl figure-of-eight pop-off sutures. The skin was closed with skin staples.  A non-occlusive dressing, Kerlix, and Ace wrap were applied.  The patient tolerated the procedure without immediate complication and was transferred to the  recovery room in stable condition. DD:  01/15/00 TD:  01/17/00 Job: [id number]

## 2010-07-31 NOTE — Discharge Summary (Signed)
[place]. [facility]  Patient:    BELLAMIE, TURNEY                         MRN: [id number] Adm. Date:  [id number] Disc. Date: 11/23/99 Attending:  Alyson Locket Dictator:   Sherrie George, P.A. CC:         Dr. Gareth Morgan   Discharge Summary  DATE OF BIRTH:  [birth year]  ADMISSION DIAGNOSES: 1. Occluded left femoral to anterior tibial bypass graft. 2. Status post left femoral to above-knee popliteal bypass graft with 6 mm    stretch Gore-Tex and vein patch angioplasty of vein graft stenosis, left    below-knee popliteal to anterior tibial bypass graft September 02 1999. 3. Adult onset diabetes mellitus, insulin dependent. 4. Hypertension. 5. Positive history of tobacco use. 6. History of melanoma.  DISCHARGE DIAGNOSES: 1. Occluded left femoral to anterior tibial bypass graft. 2. Status post left femoral to above-knee popliteal bypass graft with 6 mm    stretch Gore-Tex and vein patch angioplasty of vein graft stenosis, left    below-knee popliteal to anterior tibial bypass graft September 02 1999. 3. Adult onset diabetes mellitus, insulin dependent. 4. Hypertension. 5. Positive history of tobacco use. 6. History of melanoma.  PROCEDURES:  Left femoral to distal anterior tibial bypass graft with 6 mm PTFE and intraoperative arteriogram, November 20, 1999 by Dr. Arbie Cookey.  BRIEF HISTORY:  The patient is a 77 year old black female, medical patient of Dr. Gareth Morgan in [place], [place], who is status post left femoral to above-knee popliteal bypass graft with 6 mm Gore-Tex and vein patch angioplasty of her Gore-Tex graft of her vein graft from the left below-knee popliteal to the anterior tibial, which was done on September 02, 1999.  She also has adult onset diabetes mellitus, insulin dependent; history of melanoma; and tobacco use.  Patient presented to our office at this time with an occluded graft.  She was evaluated by Dr. Arbie Cookey and he  recommended a last ditch attempt to bypass to her distal anterior tibial, since it was the only vessel open. She had no vein left to do this, and planned to use artificial vein.  Patient was admitted electively at this time.  Please see the past medical history for further details.  HOME MEDICATIONS: 1. Humulin N 25 units a.m., 8 units p.m. a.c. 2. Ecotrin 325 mg q.d. 3. Multivitamin q.d. 4. Accupril 20 mg b.i.d.  ALLERGIES:  None known.  HOSPITAL COURSE:  The patient was admitted, underwent procedure as described above, left femoral to distal anterior tibial bypass graft with a 6 mm Gore-Tex and intraoperative arteriogram.  Patient tolerated the procedure well, returned to the recovery room and 3300 in satisfactory condition. Approximately three hours after the graft was placed, we were called because no pulse could be felt in the graft.  The graft was examined and found to be occluded.  It was Dr. Nicholes Mango opinion there was no further surgical interventions that could be performed, and the foot would have to continue on with collaterals that were already present.  This was explained to the patient.  Despite the fact that the graft is occluded, her foot has been stable.  She has been able to ambulate, and she is in the same condition she was on admission.  It was Dr. Rodell Perna opinion if she continued to do well, had no further problems, she could go home in the a.m. on  November 23, 1999 and we will have her follow up with Dr. Arbie Cookey in one week at the [facility] office with 30 minute Dopplers.  DISCHARGE ACTIVITY:  Light to moderate as tolerated.  No driving.  DIET:  She is to maintain a diabetic diet.  WOUND CARE:  Clean her incisions with plain soap and water and call if her pain becomes more severe.  FOLLOW-UP:  She will return in one week to see Dr. Arbie Cookey and our office will call and arrange that.  DISCHARGE MEDICATIONS:  Will be her preadmission medicines, as noted above, and  Tylox 1-2 p.o. q.4h. p.r.n.  CONDITION ON DISCHARGE:  Stable.  DISCHARGE LABORATORY:  Hemoglobin 11, hematocrit 31, white count 10.4. Platelets are 248,000.  Electrolytes are normal.  BUN is 18, creatinine 0.8, calcium 8.3. DD:  11/22/99 TD:  11/22/99 Job: [id number]

## 2010-07-31 NOTE — H&P (Signed)
NAMEHOLLACE, [name]                  ACCOUNT NO.:  [id number]   MEDICAL RECORD NO.:  [id number]          PATIENT TYPE:  INP   LOCATION:  A336                          FACILITY:  [facility]   PHYSICIAN:  Tesfaye D. Felecia Shelling, MD   DATE OF BIRTH:  [birth year]   DATE OF ADMISSION:  01/03/2004  DATE OF DISCHARGE:  LH                                HISTORY & PHYSICAL   CHIEF COMPLAINT:  Nausea, vomiting, and diarrhea of 2 days duration.   HISTORY OF PRESENT ILLNESS:  This is a 77 year old female patient with a  history of multiple medical illnesses, brought to the emergency room with  the above complaint.  The patient was in her usual state of health until  yesterday when she suddenly developed nausea and vomiting.  This was  followed with recurrent diarrhea.  The patient's symptoms continued to get  worse.  She was unable to keep anything in her stomach.  She was not  tolerating her oral medication.  The patient was evaluated in the emergency  room and was found to have severe hypokalemia.  She was started on IV fluids  and potassium supplements.  She is admitted as a case of probable viral  gastroenteritis.   REVIEW OF SYSTEMS:  The patient has no fevers, chills, headache, chest pain,  shortness of breath, abdominal pain, dysuria, urgency, or frequency of  urination.   PAST MEDICAL HISTORY:  1.  Diabetes mellitus type 2.  2.  Peripheral vascular disease status post bilateral below knee amputation.  3.  Gastrointestinal bleed.  4.  Left stump wound.  5.  Hypertension.  6.  Chronic obstructive pulmonary disease.  7.  Depression disorder.   CURRENT MEDICATIONS:  1.  Protonix 40 mg p.o. daily.  2.  NPH insulin 26 units subcutaneous in a.m. and 10 units subcutaneous in      p.m.  3.  Ascorbic acid 250 mg p.o. daily.  4.  Tenormin 50 mg p.o. daily.  5.  Lotensin 30 mg p.o. daily.  6.  Colace 100 mg p.o. daily.  7.  Folic acid 1 mg p.o. daily.  8.  Hydrochlorothiazide 25 mg p.o. daily.  9.   Niferex 150 mg p.o. daily.  10. Zocor 20 mg p.o. daily.  11. KCl 20 mEq p.o. daily.   SOCIAL HISTORY:  The patient is married.  She lives with her husband.  There  is no history of alcohol, tobacco, or substance abuse.   PHYSICAL EXAMINATION:  GENERAL:  The patient is alert, awake, and acutely  sick looking.  VITAL SIGNS:  Blood pressure 135/78, pulse 99, respiratory rate 20,  temperature 97 degenerative Fahrenheit.  HEENT:  Pupils equal and reactive.  NECK:  Supple.  CHEST:  Decreased air entry without rales or rhonchi.  CARDIOVASCULAR:  First and second heart sound heard.  No murmur, no gallop.  ABDOMEN:  Soft and lax.  Bowel sounds are positive.  No mass or  organomegaly.  EXTREMITIES:  Status post bilateral below knee amputations.   LABORATORY DATA:  CBC revealed WBC of 13.8, hemoglobin 15.3, hematocrit  45.7, platelets 299.  Sodium 135, potassium 2.7, chloride 96, carbon dioxide  24, glucose 254, BUN 17, creatinine 0.8, bilirubin 1.4.  Alkaline  phosphatase 112, AST 29, ALT 18.  Total bilirubin 8.4, albumin 4.3.  Calcium  9.4, magnesium 2.1.  Urine specific gravity of 1.025, pH 6.5, WBC little to  2, bacteria many.   ASSESSMENT:  1.  Acute gastroenteritis, probably viral etiology.  2.  Hypokalemia secondary to the above.  3.  Diabetes mellitus.  4.  Peripheral vascular disease, and is status post bilateral below knee      amputations.  5.  Hypertension.  6.  Chronic obstructive pulmonary disease.   PLAN:  Will keep the patient on a clear liquid diet.  Will continue IV  fluids.  Will supplement the patient with IV potassium.  Will given  Phenergan p.r.n.  Will do Accu-Chek and use sliding scale coverage.  Will  hold her medications until her symptoms improve.     Tesf   TDF/MEDQ  D:  01/03/2004  T:  01/03/2004  Job:  [id number]

## 2010-07-31 NOTE — Op Note (Signed)
NAME:  [name], [name]                            ACCOUNT NO.:  [id number]   MEDICAL RECORD NO.:  [id number]                   PATIENT TYPE:  INP   LOCATION:  A340                                 FACILITY:  [facility]   PHYSICIAN:  R. Roetta Sessions, M.D.              DATE OF BIRTH:  April 01, 1933   DATE OF PROCEDURE:  05/18/2002  DATE OF DISCHARGE:                                 OPERATIVE REPORT   PROCEDURE:  Esophagogastroduodenoscopy with biopsy.   INDICATIONS FOR PROCEDURE:  The patient is a 77 year old lady admitted to  the hospital with nausea and vomiting and coffee ground emesis. Her  hemoglobin has dropped but still well within normal range (14 today). She  has not had any abdominal pain.  She has a history of type 1 diabetes  mellitus. EGD is now being done to further evaluate her symptoms. It is  notable that her nausea is much better and she is hungry. This approach has  been discussed with the patient previously. The potential risks, benefits,  and alternatives have been reviewed, questions answered.  She is agreeable.  Please see the documentation in medical records.   MONITORING:  O2 saturation, blood pressure, pulse, and respirations were  monitored throughout the entire procedure.   CONSCIOUS SEDATION:  Versed 3 mg IV, Demerol 75 mg IV in divided doses.  Cetacaine spray for topical oropharyngeal anesthesia.   INSTRUMENTS:  Olympus Video chip adult gastroscope.   FINDINGS:  Examination of the tubular esophagus revealed marked inflammatory  changes involving the distal half of the tubular esophagus. Numerous  circumferential long deep linear erosions involving the distal 1/2 of the  tubular esophagus and mucosa was very friable. These inflammatory changes  were present all the way down to the EG junction, please see photos. The EG  junction was easily traversed.   STOMACH:  The gastric cavity was empty and insufflated well with air. A  thorough examination of the gastric  mucosa including a retroflexed view of  the proximal stomach and esophagogastric junction demonstrated mottled  erythema and erosions of the gastric body and cardia. The  mucosa otherwise  appeared normal. The pylorus was patent and easily traversed.   DUODENUM:  The bulb and second portion appeared normal.   THERAPEUTIC/DIAGNOSTIC MANEUVERS:  The areas of eroded inflamed mucosa up in  the body were biopsied for histologic study.   The patient tolerated the procedure well and was reacted in endoscopy.   IMPRESSION:  1. Marked inflammatory change involving the distal 1/2 of the tubular     esophagus consistent with rather severe erosive reflux esophagitis.  2. Erosions in the body and cardia of the gastric mucosa. The remainder of     the stomach and duodenum through the second portion appeared normal.   I expect the patient has bled from her esophagus.   RECOMMENDATIONS:  1. Advance to 2000 calorie 4 gm  sodium ADA diet.  2. Switch Protonix to 40 mg orally daily indefinitely.  3. Check H. pyloric serology, followup on biopsies.  4. Consider a baseline gastric emptying studying as an outpatient when she     is over her acute illness.                                               Jonathon Bellows, M.D.    RMR/MEDQ  D:  05/18/2002  T:  05/19/2002  Job:  [id number]   cc:   Mila Homer. Sudie Bailey, M.D.  [address] [place], [place] [address]  Fax: [phone]

## 2010-07-31 NOTE — Discharge Summary (Signed)
[facility]  Patient:    [name], [name]                         MRN: [id number] Adm. Date:  [id number] Disc. Date: 09/06/99 Attending:  Alyson Locket Dictator:   Eugenia Pancoast, P.A. CC:         Larina Earthly, M.D.             Katharine Look, M.D., [place], [place]                           Discharge Summary  FINAL DIAGNOSES:  1. Vein graft stenosis of distal end of prior femoral-to-above knee popliteal     bypass graft.  2. Peripheral vascular disease.  3. Diabetes mellitus type 2, on insulin.  4. Hypertension.  PROCEDURES:  September 04, 1999 - Left femoral-to-above knee popliteal bypass, with 6 mm, thin-walled, stretched Gore-Tex to vein patch angioplasty to distal anastomosis of previous femoral-to-below knee popliteal anterior tibial bypass.  Surgeon: Dr. Tawanna Cooler Early.  HISTORY:  This is a 77 year old female with prior left femoral-to-distal anterior tibial bypass graft done on June 04, 1993 using saphenous vein graft.  The patient had developed graft stenosis noted first on July 01, 1994.  She has been followed routinely and subsequently noted to be developing an increasing frequencies in her lower leg near the distal anastomoses on Doppler studies.  She subsequently underwent arteriogram which revealed new high-grade stenosis of the distal superficial femoral artery and a tight stenosis at the distal anastomosis at the femoral anterior tibial bypass graft.  This was discussed with patient.  Surgery was discussed.  Risks and benefits were explained.  The patient understood and agreed to surgery.  HOSPITAL COURSE:  The patient was admitted to [facility] on September 02, 1999.  At that time she underwent left common femoral artery to above-knee popliteal bypass using a 6 mm stretched, thin-walled, Gore-Tex graft.  She then underwent vein patch angioplasty of distal anastomosis of the left popliteal-to-anterior tibial bypass.  The patient  tolerated the procedure well and no intraoperative complications occurred.  Postoperatively the patient did satisfactorily.  No untoward events occurred during her stay.  She had noted popliteal pulses.  She had distal pulses via Doppler.  Her incision was healing satisfactorily.  No sign of infection was noted and she was up and ambulating by the second postoperative day and continued to do well.  By June 23 she was doing quite well.  She was ambulating without difficulty, tolerating diet satisfactorily.  All incisions were healing satisfactorily. No sign of infection was noted.  At this time she was prepared for discharge.  DISCHARGE MEDICATIONS:  1. NPH insulin 25 units in a.m., 8 units in p.m.  2. Enteric-coated aspirin 325 mg q.d.  3. Accupril 20 mg b.i.d.  4. Multivitamin 1 q.d.  5. Percocet 1-2 p.o. q.4-6h. p.r.n. pain.  FOLLOWUP:  The patient will follow up with Dr. Tawanna Cooler Early in two weeks.  The patient has staples in her incisions.  These will be removed at Dr. Nicholes Mango office in approximately 5-7 days after discharge.  CONDITION ON DISCHARGE:  The patient is subsequently discharged home in satisfactory and stable condition on September 06, 1999. DD:  09/06/99 TD:  09/07/99 Job: [id number]

## 2010-07-31 NOTE — Group Therapy Note (Signed)
   [name], [name]                              ACCOUNT NO.:  [id number]   MEDICAL RECORD NO.:  [id number]                  PATIENT TYPE:   LOCATION:  209                                  FACILITY:   PHYSICIAN:  Angus G. Renard Matter, M.D.              DATE OF BIRTH:   DATE OF PROCEDURE:  06/10/2002  DATE OF DISCHARGE:                                   PROGRESS NOTE   SUBJECTIVE:  This patient apparently had a urinary tract infection with  Escherichia coli, insulin-dependent diabetes, peripheral vascular disease,  left below-knee amputation.  Is admitted with possible transient ischemic  attack.  Her condition remains stable.   OBJECTIVE:  Vital signs:  Blood pressure 171/68, respirations 24, pulse 64,  temperature 99.3.  Lungs:  Diminished breath sounds.  Heart:  Regular  rhythm.  Abdomen:  No palpable organs or masses.   ASSESSMENT:  The patient was admitted with what appeared to be transient  ischemic attack or cerebrovascular accident.  Does have a urinary tract  infection secondary to Escherichia coli, insulin-dependent diabetes,  peripheral vascular disease.   PLAN:  To continue current regimen.                                               Angus G. Renard Matter, M.D.    AGM/MEDQ  D:  06/10/2002  T:  06/10/2002  Job:  [id number]

## 2010-07-31 NOTE — Op Note (Signed)
[place]. [facility]  Patient:    [name], [name] Visit Number: [id number] MRN: [id number]          Service Type: DSU Location: [id number] Attending Physician:  Bertrum Sol Dictated by:   Beulah Gandy. Ashley Royalty, M.D. Proc. Date: 04/13/01 Admit Date:  04/13/2001                             Operative Report  DATE OF BIRTH:  08-20-1933  PREOPERATIVE DIAGNOSIS:  Vitreous hemorrhage, proliferative diabetic retinopathy, and pseudophakia, left eye.  POSTOPERATIVE DIAGNOSIS:  Vitreous hemorrhage proliferative diabetic retinopathy, and pseudophakia, left eye.  PROCEDURE:  Pars plana vitrectomy, retinal photocoagulation, and membrane peel, left eye.  SURGEON:  Beulah Gandy. Ashley Royalty, M.D.  ASSISTANT:  Lu Duffel, COA, SA.  ANESTHESIA:  General.  DESCRIPTION OF PROCEDURE:  Usual prep and drape, peritomies at 10, 2, and 4 oclock.  A 4 mm angled infusion port anchored into place at 4 oclock.  The lighted tissue manipulator and cutter placed at 10 and 2 oclock respectively. The flat contact lens was placed onto a layer of methylcellulose and on to the cornea.  The contact lens ring was anchored into place at 6 and 12 oclock. The vitrectomy was begun just behind the pseudophakos where blood and vitreous were encountered.  These were carefully removed under low suction and rapid cutting.  The core vitrectomy was completed down to the macular surface. Strands of vitreous were attached to the retina in multiple locations.  These were pulled free with forceps and the stumps were cauterized with the tissue manipulator.  The blood was vacuumed from the retinal surface with the tissue manipulator and suction.  The vitrectomy was carried into the periphery where additional blood was removed with the vitreous cutter down to the vitreous base.  Scleral depression was used.  An area of attachment of the vitreous from the disk to the 10 oclock was encountered.  This  was removed with forceps and the stumps were cauterized.  Once the entire vitreous cavity was clean, the Endolaser was positioned and 855 burns placed around the retinal periphery with a power of 400 mW, 1000 microns each at 0.1 seconds each.  Once this was accomplished, additional blood was vacuumed from the retinal surface.  The instruments were removed from the eye and 9-0 nylon was used to close the sclerotomy sites.  The conjunctiva was closed with wet-field cautery.  The wounds were tested and found to be secure after multiple sutures placed.  Closing tension was 10 with the Barraquer tonometer.  Polymyxin and gentamicin were irrigated into Tenons space after ________ solution was applied.  Marcaine was injected around the globe for postoperative pain.  Decadron 10 mg was injected in the lower subconjunctival space.  Polysporin, a patch, and shield was placed.  The patient was awakened and taken to recovery in satisfactory condition. Dictated by:   Beulah Gandy. Ashley Royalty, M.D. Attending Physician:  Bertrum Sol DD:  04/13/01 TD:  04/14/01 Job: [id number]

## 2010-07-31 NOTE — Group Therapy Note (Signed)
NAME:  [name], LANDRY                            ACCOUNT NO.:  [id number]   MEDICAL RECORD NO.:  [id number]                   PATIENT TYPE:  INP   LOCATION:  A312                                 FACILITY:  [facility]   PHYSICIAN:  Mila Homer. Sudie Bailey, M.D.           DATE OF BIRTH:  26-Jun-1933   DATE OF PROCEDURE:  DATE OF DISCHARGE:                                   PROGRESS NOTE   SUBJECTIVE:  The patient is feeling better today.  She still is depressed;  however.  I talked to her husband at length.  Unfortunately the patient  apparently has not seen her oldest child, a son, in a year.  She has 3 other  children with her husband, and she does see them on a regular basis.  Her  resident thinks that a lot of her depression is secondary to not seeing her  son.   She continues on atenolol 50 mg daily, benazepril 20 mg daily, HCTZ 12.5 mg  daily, and KCl 20 mEq b.i.d. for her longstanding hypertension.  She is also  on benazepril for renal protection.  She is on Lovenox 75 mg subcu daily and  ASA 325 mg daily for DVT prophylaxis and stroke prevention.  She is also on  her insulin Novolin/Humulin 26 units in the morning and 20 units at q.p.m.  She continues on Multivitamins daily and Folic acid daily.  I took her off  the Paxil and the Reglan last night thinking that they may contribute to her  sleepiness and fatigue; and, instead, put her on Wellbutrin 150 mg b.i.d.,  which has just started today.  She continues on Protonix 40 mg daily for  prevention of stress ulcers.  She is on Levaquin 500 mg daily for her E.  coli UTI.  She is now off Plavix.   Nursing record reviewed.  She does have a blackened area on the right heel  and a number of bruised areas on the abdomen, left arm, and right arm.  Her  full risks scores were 8.  She has not had a BM in greater than 3 days.   OBJECTIVE:  She is actually sitting up in a chair today.  The first that I  have seen her in a chair since  hospitalization.  She has an obvious left  BKA.  Temperature is 98.4, pulse 66, respiratory rate 20, blood pressure  156/67.  She has been eating.  She appears to be oriented and alert today.  She is well-developed, well-nourished, in no acute distress.  She does not  look clinically depressed initially but after I have talked with family and  extensively with her husband, she gets depressed and looks depressed.  Today  the heart has a regular rhythm at a rate of 70.  The lungs are clear  throughout.  The abdomen is soft without hepatosplenomegaly or mass.  There  is no  edema in the distal right leg.  She still has extensive bruising in  the medial aspect of the right upper arm and the volar aspect of the right  forearm.  There is still some swelling and there is some pitting edema, but  less so; unless the wooden feeling that her arm was having just a couple of  days ago.   Her glucoses were reviewed and were 61, 149, 115, and 139 the last day.   ASSESSMENT:  1. Escherichia coli urinary tract infection, much improved.  2. Type 1 diabetes, much improved on regular insulin.  3. Depression due to family stresses.  4. Essential hypertension, fairly well controlled.  5. Diabetic gastroparesis.   PLAN:  We will continue with the current regimen.  Again, I talked to the  patient and her husband about family stresses over at least 20 minutes in  the room.  Will continue to counsel her with this.  If she wakes up and  energy increases, hopefully will be able to get her home in a few days.   NOTE: Total time spent on review of this patient's problems today; including  review of the computer data, review of her paper chart, extensive discussion  with her husband before going to the room; discussion with the patient and  her husband in the room, and writing of records, orders and dictation took  1/2 hour.                                               Mila Homer. Sudie Bailey, M.D.    SDK/MEDQ   D:  06/16/2002  T:  06/17/2002  Job:  [id number]

## 2010-07-31 NOTE — Discharge Summary (Signed)
[facility]  Patient:    [name], [name]                         MRN: [id number] Adm. Date:  [id number] Disc. Date: 09/06/99 Attending:  Alyson Locket Dictator:   Eugenia Pancoast, P.A.                           Discharge Summary  DATE OF BIRTH: December 29, 1923  FINAL DIAGNOSIS: 1. _______ 2. Peripheral vascular disease. 3. Diabetes mellitus Type II, on insulin. 4. Hypertension.  PROCEDURES September 02, 1999: 1. Left femoral to above-knee popliteal bypass with 6 mm thin-walled    stretched cortex to vein patch angioplasty to distal anastomosis previous    fem to below knee popliteal anterior tibial bypass.  SURGEON: Dr. Tawanna Cooler Early.  HISTORY OF PRESENT ILLNESS: This is a 77 year old female with prior left femoral ______ tibial bypass graft on June 04, 1993 using _________.  Patient had _____ noted worse in July 02, 1994. She had been followed routinely and subsequently noted to be developing an increasing frequency of lower leg distal anastomosis on ______.  She subsequently underwent ______ which revealed new high stenosis of the distal superficial femoral artery and DD:  09/06/99 TD:  09/06/99 Job: [id number]

## 2010-07-31 NOTE — Consult Note (Signed)
NAME:  [name], [name]                            ACCOUNT NO.:  [id number]   MEDICAL RECORD NO.:  [id number]                   PATIENT TYPE:  INP   LOCATION:  A312                                 FACILITY:  [facility]   PHYSICIAN:  Kofi A. Gerilyn Pilgrim, M.D.              DATE OF BIRTH:  03-20-1933   DATE OF CONSULTATION:  DATE OF DISCHARGE:                                   CONSULTATION   REFERRING PHYSICIAN:  Dr. Mila Homer. [place].   IMPRESSION:  Multifactorial encephalopathy.  Probably, the most prominent  finding is from the posterior circulation infarcts.  The presentation is  worrisome for significant basilar disease.  Other contributing factors  include a urinary tract infection/urosepsis.  The waxing-and-waning level of  consciousness may also indicate the potential for seizures.   RECOMMENDATIONS:  I think an MRA of the brain, particularly looking for the  basilar artery integrity, is very important.  For now, we will continue with  the current full-dose Lovenox and aspirin.  Also recommend an EEG,  swallowing evaluation.   HISTORY OF PRESENT ILLNESS:  The patient is a 77 year old lady who  apparently has been hospitalized several times over the past month.  She  initially presented in early March for significant nausea and vomiting.  She  presented again for apparently just feeling weak and was noted to have right-  sided weakness.  She also has been treated for a urinary tract infection  and, in fact, has had elevated temperatures to 101.  She apparently has had  relatively significant alteration and waxing level of consciousness;  sometimes she is quite drowsy and is difficult to arouse; at other times,  she is awake and is able to eat.   PAST MEDICAL HISTORY:  The past medical history is significant for  hypertension, diabetes, hyperlipidemia and peripheral vascular disease.  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   CURRENT MEDICATIONS:  1. Insulin.  2. Lovenox 80 mg b.i.d.  3. Folic acid.  4. Multivitamins.  5. Aspirin 325 mg once a day.  6. Benazepril 20 mg once a day.  7. Simvastatin.  8. Potassium.  9. Atenolol.  10.      Protonix.  11.      Phenergan.  12.      Tylenol with codeine.   REVIEW OF SYSTEMS:  Review of systems are limited, given the reduced level  of consciousness, but no nausea or vomiting are reported.  No cough or  shortness of breath.  No chest pain.  No palpitation.   ALLERGIES:  PENICILLIN.   PHYSICAL EXAMINATION:  VITAL SIGNS:  Currently afebrile with a temperature  of 98.8, blood pressure 151/58, pulse 64, respirations 20.  LUNGS:  Lungs are clear to auscultation bilaterally.  CARDIOVASCULAR:  Exam reveals normal S1 and S2.  ABDOMEN:  Abdomen is soft.  EXTREMITIES:  She has a below-the-knee amputation on the left.  The left  upper extremity is significantly swollen; apparently, ultrasound has been  negative.  NEUROLOGIC:  The patient lies in bed with her eyes closed.  She requires  persistent and maximum stimulation to open her eyes.  After multiple  attempts to keep her eyes open, she consistently closes her eyes.  She does  open it repeatedly to sternal rub.  She does not follow commands  consistently.  She occasionally follows axial commands.  Pupils are 4 mm and  briskly reactive.  She does have full extraocular movements.  There is no  nystagmus seen.  Facial muscle strength appears symmetric bilaterally.  Motor examination shows again some right hemiparesis, about 2/5 in the right  upper extremity and 3-2/5 in the right lower extremity.  She also has  somewhat reduced sensation to pain on the right side.  She moves her left  side to painful stimuli with normal strength.  The reflexes are diminished  throughout.  The plantar reflexes are equivocal on the right side.   LABORATORY AND ACCESSORY CLINICAL DATA:   Carotid Dopplers are unremarkable.  There are no stenoses seen.   Echocardiography shows left ventricular hypertrophy, ejection fraction of  55%.  There apparently was a severely sclerotic aortic valve.   MRI of the brain shows acute infarct seen on diffusion-weighted sequence  involving the left thalamus and left mid-brain.  There is also one involving  the right centrum semiovale.   Thanks for allowing me to participate in the care of the patient. Please see  initial reports for assessment and plan.                                               Kofi A. Gerilyn Pilgrim, M.D.    KAD/MEDQ  D:  06/20/2002  T:  06/20/2002  Job:  [id number]

## 2010-07-31 NOTE — Group Therapy Note (Signed)
NAMEASHRITHA, DESROSIERS                  ACCOUNT NO.:  [id number]   MEDICAL RECORD NO.:  [id number]          PATIENT TYPE:  INP   LOCATION:  A336                          FACILITY:  [facility]   PHYSICIAN:  Angus G. Renard Matter, M.D. DATE OF BIRTH:  03/02/1934   DATE OF PROCEDURE:  DATE OF DISCHARGE:                                   PROGRESS NOTE   SUBJECTIVE:  This patient was admitted with nausea, vomiting and diarrhea  thought to be due to viral gastroenteritis.  She is also a diabetic.  Her  sugars have ranged from 241 to 328.   OBJECTIVE:  VITAL SIGNS:  Blood pressure 149/70, respirations 22, pulse 103,  temperature 99.5.  LUNGS:  Lungs are clear to P&A.  HEART:  Regular rhythm.  ABDOMEN:  No palpable organs or masses.   ASSESSMENT:  Patient was admitted with acute gastroenteritis and  hypokalemia.  She does have diabetes mellitus, peripheral vascular disease  and hypertension.   PLAN:  Plan to continue current regimen.  Attempt to advance diet.  Repeat  BMET      AGM/MEDQ  D:  01/04/2004  T:  01/04/2004  Job:  [id number]

## 2010-07-31 NOTE — Discharge Summary (Signed)
NAMEKAI, RAILSBACK                  ACCOUNT NO.:  [id number]   MEDICAL RECORD NO.:  [id number]          PATIENT TYPE:  INP   LOCATION:  A325                          FACILITY:  [facility]   PHYSICIAN:  Tesfaye D. Felecia Shelling, MD   DATE OF BIRTH:  Apr 02, 1933   DATE OF ADMISSION:  09/22/2008  DATE OF DISCHARGE:  07/15/2010LH                               DISCHARGE SUMMARY   DISCHARGE DIAGNOSIS:  1. Nausea, vomiting, probably secondary to diabetic gastroparesis.  2. Gastritis.  3. Diabetes mellitus.  4. Coronary artery disease.  5. Status post cerebrovascular accident.  6. Status post bilateral above-knee amputation.   DISCHARGE MEDICATIONS:  1. Actos 30 mg p.o. daily.  2. Lisinopril 20 mg daily.  3. Lovastatin 20 mg daily.  4. Norvasc 10 mg daily.  5. KCl 20 mEq daily.  6. Humulin N 20 units subcu in a.m. and 10 units subcu in p.m.  7. Omeprazole 20 mg p.o. daily.  8. Reglan 5 mg p.o. before meals and nightly.   DISPOSITION:  The patient was discharged home in a stable condition.   HOSPITAL COURSE:  This is a 77 years old female patient with history of  multiple medical illnesses, who was admitted due to nausea and vomiting.  The patient is a known case of diabetes mellitus.  She was evaluated by  GI and was found to have gastritis.  The patient also has symptoms of  diabetic gastroparesis.  She was started on 5 mg of Reglan before meals  and nightly.  Her symptoms improved and the patient was discharged back  home to continue her treatment.      Tesfaye D. Felecia Shelling, MD  Electronically Signed     TDF/MEDQ  D:  10/14/2008  T:  10/14/2008  Job:  [id number]

## 2010-07-31 NOTE — H&P (Signed)
NAME:  [name], [name]                            ACCOUNT NO.:  [id number]   MEDICAL RECORD NO.:  [id number]                   PATIENT TYPE:  INP   LOCATION:  A209                                 FACILITY:  [facility]   PHYSICIAN:  Sarita Bottom, M.D.                  DATE OF BIRTH:  13-Jan-1934   DATE OF ADMISSION:  06/04/2002  DATE OF DISCHARGE:                                HISTORY & PHYSICAL   CHIEF COMPLAINT:  She is weak.   HISTORY OF PRESENT ILLNESS:  The patient is a 77 year old lady with  hypertension and diabetes.  She was recently discharged from [facility] for treatment of a viral gastroenteritis and uncontrolled diabetes.  The patient has been persistently weak since discharge, and the weakness has  been getting progressively worse.  She was, therefore, brought to the  emergency room today for evaluation and treatment.  While I was alerted in  the emergency room she was noted to have slurred speech.  On direct  questioning the patient has not been compliant with her insulin for about  three days now.   REVIEW OF SYSTEMS:  Family members deny any vomiting or diarrhea.  There is  no fever.  No recent weight loss.  No cough or shortness of breath.  No  chest pain or palpitations.  No dysuria.  All other systems reviewed and  were negative.   PAST MEDICAL HISTORY:  1. Diabetes mellitus type 2.  2. Hypertension.  3. Hyperlipidemia.  4. Peripheral vascular disease.  5. Status post __________  above-knee amputation.  6. History of breast cyst removal.  7. History of esophageal reflux disease.   CURRENT MEDICATIONS:  Reviewed from her old chart:  1. NPH 20 units b.i.d.  2. Protonix 40 mg daily.  3. Benazepril 20 daily.  4. Hydrochlorothiazide 12.5 daily.  5. Zocor 40 mg daily.   ALLERGIES:  She is allergic to PENICILLIN.   FAMILY HISTORY:  Significant for diabetes in a brother and a sister.   SOCIAL HISTORY:  She is married, with four children.  She does not  smoke or  drink alcohol.  She is currently on social security disability.   PHYSICAL EXAMINATION:  VITAL SIGNS:  BP is 152/65, heart rate is 75,  respiratory rate is 18.  The patient is afebrile.  GENERAL:  Elderly lady lying on the stretcher.  She is ill looking but not  in any distress.  HEENT:  She has a dry oral mucosa.  She is not pale.  She is anicteric.  Pupils are equal and reactive to light and accommodation.  She has a flat  right nasolabial fold.  NECK:  Supple.  There are no carotid bruits.  No lymphadenopathy.  No  jugular venous distention.  CHEST:  Air entry is good bilaterally.  Breath sounds are vesicular.  No  wheezes or crackles were  heard.  CVS:  Heart sounds 1 and 2 are normal.  No gallops.  Rhythm is regular.  No  murmurs were appreciated.  ABDOMEN:  Soft.  Bowel sounds are present.  CNS:  She is awake.  She is alert.  She has a slurred speech.  Power is 2/5  in the right upper extremity and 2+/5 in the right lower extremity.  The  reflexes are reduced in the right side.  She is hypotonic on the right side.  Sensation appears intact.  Gait is not tested.  EXTREMITIES:  She has a __________  above-knee amputation.  She has 1+ pedal  edema.  Pulses 1+ on the right dorsalis pedis.   LABORATORIES AND DIAGNOSTICS:  CT scan of the head has been done.  Results  pending.   ABG done on 2 L of oxygen shows a pH of 7.42, pCO2 47, pO2 of 122,  bicarbonate of 30.2, oxygen saturation of 98.7.  CK is 26, MB fraction is  1.6, troponin is 0.02.  Albumin is 3.2, bilirubin is 1.5.  PTT 29, INR 0.9,  PT is 13.  Sodium is 126, potassium is 3.6, BUN of 22, creatinine is 1.7,  glucose is 677, calcium is 8.7.  Anion gap is 14.  WBC 6.5, neutrophil count  is 81%, hemoglobin is 14.3.  The UA shows leukocyte-positive, ketones-  positive pyuria.   EKG shows sinus rhythm at 78 beats per minute.  Normal electrical axis.  Normal intervals.  She has some slight ST depression in V5 and V6.   There  are no acute __________  changes.   ASSESSMENT AND PLAN:  1. New-onset of cerebrovascular accident:  I will follow with a CT scan of     the head.  I will also check a stool guaiac.  She will be monitored on     telemetry.  The patient will be given aspirin and Plavix.  Lovenox will     be given if there is no hemorrhage on the CT.  I will schedule her for     Doppler ultrasound of carotid, and a 2-D echo will also be done.  The     patient will need neurological evaluation on this admission.  I will also     check a homocystine level and will give him folic acid and multiple     vitamin.  2. Uncontrolled diabetes mellitus:  The patient will be resumed on her NPH     Insulin 20 units b.i.d.  Her hemoglobin A1C level will be checked, and     she will be placed on Accu-Chek with Regular Insulin coverage q.6h.  3. Hyponatremia, probably secondary to volume depletion and dehydration.     The patient will be given IV normal saline 100 mL/hr.  Will monitor her     volume status while she is being hydrated.  4. Urinary tract infection:  The patient will be given IV Levaquin 500 mg     daily.  Will also do some blood cultures and send urine cultures.  5. Hypertension:  The patient will be continued on her Benazepril 20 daily     and hydrochlorothiazide 12.5 daily.  Monitor her blood pressure on this     regimen and adjust her medications accordingly.  6. Hyperlipidemia:  The patient will be resumed on Zocor 40 daily.  Will     check a lipid profile tomorrow morning.   COMMENTS:  The patient is to be admitted under the care  of primary M.D., Dr.  Sudie Bailey.  Further workup and management will depend on clinical course.  Coordination of care is about 60 minutes.                                               Sarita Bottom, M.D.    DW/MEDQ  D:  06/04/2002  T:  06/05/2002  Job:  [id number]

## 2010-07-31 NOTE — Group Therapy Note (Signed)
NAME:  [name], [name]                            ACCOUNT NO.:  [id number]   MEDICAL RECORD NO.:  [id number]                   PATIENT TYPE:  INP   LOCATION:  A312                                 FACILITY:  [facility]   PHYSICIAN:  Mila Homer. Sudie Bailey, M.D.           DATE OF BIRTH:  Jun 16, 1933   DATE OF PROCEDURE:  DATE OF DISCHARGE:                                   PROGRESS NOTE   SUBJECTIVE:  The patient is still sleepy.  Says she feels better.  She has  not eaten today.  She did have her MRI of the brain today with and without  contrast.  She had blood work done this morning.  She had had a bowel  movement last night, according to the patient.   PLAN:  Continue her atenolol 50 mg daily, benazepril 20 mg daily, HCTZ 12.5  mg daily, and KCl 20 mEq b.i.d. for hypertension.  One dose of Lasix 80 mg  p.o. to see if she will urinate a good quantity and possibly the hemoglobin  level will go up on this (may have dilutional anemia).  Continue on  simvastatin 40 mg daily with ASA 325 mg daily, Lovenox 70 mg subcu daily,  Protonix 40 mg daily. Continue multivitamins daily and folic acid 1 mg  daily, and for what turns out to be insulin dosage on Humulin-N, 26 units in  the a.m. and 20 units at 6 p.m.   OBJECTIVE:  This morning her temperature is 97.6, pulse 56, respiratory rate  of 20, blood pressure 138/48.  Her sugars were 120, 56, 169, and 92 over the  last 24 hours.  At the time of my exam, which is later on in the day, she is  oriented and alert in no acute distress, but looks very fatigued.  Heart has  a regular rhythm with a rate of 70.  Lungs appear clear throughout.  She  still has about 2+ to 3+ pitting edema involving the right arm and forearm  with bruising in the medial right arm and the volar forearm.  The abdomen is  soft without tenderness and without hepatosplenomegaly or mass.  There are  bony changes of the distal right leg, but no edema.   Blood gases, this morning, on room  air showed a pH of 7.43, pCO2 of 41, pO2  of 45.  Her H&H was 8.1 and 24.1 with the hemoglobin having dropped from  12.2 to 8.1 since 06/06/2001.  Her white cell count was 10,000, 65%  neutrophils, 24 lymphs.  MET-7 showed a glucose of 59, sodium was 134, and  albumin was 2.1.   The MRI of the brain was consistent with small acute infarcts of the left  thalamus, left midbrain, and the right semi ovale without mass effect.   ASSESSMENT:  1. Confusion of multifactorial etiology which can include infarcts, hypoxia,     and anemia.  2. Type 1 diabetes.  3. E. coli urinary tract infection.  4. Depression.  5. Essential hypertension.  6. Constipation.   I went down to Radiology, personally.  I talked to the radiologist at length  about this patient.  I then also went to the MRI scanner with her to talk to  the crew there.  Time spent here, plus time reviewing her electronic chart  including  her vital signs, medications, and nursing notes, time reviewing the paper  chart, and time talking to her husband and the patient in the room followed  by exam and then the review of her medical problems, was 45 minutes today.  I actually made two separate trips to the hospital to take care of her.                                                Mila Homer. Sudie Bailey, M.D.    SDK/MEDQ  D:  06/19/2002  T:  06/20/2002  Job:  [id number]

## 2010-07-31 NOTE — Procedures (Signed)
   NAME:  NEOLA, WORRALL                            ACCOUNT NO.:  [id number]   MEDICAL RECORD NO.:  [id number]                   PATIENT TYPE:   LOCATION:                                       FACILITY:   PHYSICIAN:  Kofi A. Gerilyn Pilgrim, M.D.              DATE OF BIRTH:  07/17/1933   DATE OF PROCEDURE:  DATE OF DISCHARGE:                                EEG INTERPRETATION   The patient is a 38 year who presents with a waxing and waning level of  consciousness.  She does have an encephalopathy.   ANALYSIS:  A 16-channel recording that was conducted for approximately 20  minutes.   DATA:  Posterior rhythm is about  8.5 to 9 Hz which attenuates with eye  opening.  There is beta activity seen in the frontal areas.  Awake and sleep  architectures are seen.  K-complexes and sleep spindles are observed,  indicating stage II sleep.  There is occasional right-left temporal central  slowing and some transient sharp activity of sleep.  No clearly defined  epileptiform activity.   IMPRESSION:  Somewhat abnormal electroencephalographic recording with focal  slowing,  This is usually seen in focal cerebral disturbance and/or  epileptic focus.  Clinical correlation is suggested.                                               Kofi A. Gerilyn Pilgrim, M.D.    KAD/MEDQ  D:  06/22/2002  T:  06/22/2002  Job:  [id number]

## 2010-07-31 NOTE — Procedures (Signed)
[facility]  Patient:    [name], [name]                         MRN: [id number] Adm. Date:  [id number] Disc. Date: [id number] Attending:  Alyson Locket CC:         John Giovanni, M.D., [address], [place], [place]  [address]                           Procedure Report  PREOPERATIVE DIAGNOSES: 1. Vein graft stenosis of prior placed left popliteal-to-anterior-tibial    bypass. 2. Right leg claudication.  POSTOPERATIVE DIAGNOSES: 1. Vein graft stenosis of prior placed left popliteal-to-anterior-tibial    bypass. 2. Right leg claudication.  PROCEDURE:  Aortogram with bilateral lower extremity runoff.  SURGEON:  Larina Earthly, M.D.  ANESTHESIA:  Lidocaine 1% local and Versed 2 mg IV sedation.  COMPLICATIONS:  None.  DISPOSITION:  To holding area, stable.  PROCEDURE IN DETAIL:  The patient was taken to the cath lab and placed in supine position, where the area of the right and left groins was prepped and draped in the usual sterile fashion.  Nonionic contrast was used secondary to her severe generalized debilitated state.  Using local anesthesia, the right common femoral artery was entered with a single-wall stick and the guidewire was passed up to the level of the suprarenal aorta.  A 5-French sheath was passed over the guidewire and a pigtail catheter passed to the level of the suprarenal aorta.  An AP projection was undertaken at this level and this revealed single widely patent renal arteries bilaterally; widely patent superior mesenteric artery as well.  Patient also had a patent inferior mesenteric artery.  There was no evidence of aortoiliac stenosis.  The pigtail catheter was then drawn down to the level of the aortic bifurcation and iliac and pelvic projections revealed widely patent external iliac, internal iliac and common iliac arteries bilaterally.  Left leg runoff was then obtained by selectively catheterization the left common iliac  artery, first by placing an IMA catheter to selectively catheterize the left common iliac artery and then an internal catheter was passed further down the common iliac artery.  Runoff on the left revealed patent proximal superficial artery with moderate stenosis in the mid superficial femoral artery.  There was a high-grade subtotal occlusion of the superficial femoral artery-popliteal artery junction and the popliteal artery reopened with a normal caliber.  There was a takeoff of a ______ popliteal-to-anterior-tibial-artery bypass in the below-knee popliteal artery and the anastomosis was widely patent.  There was complete occlusion of all native tibial vessels.  The anterior tibial artery bypass was opened with a high-grade stenosis at the distal anastomosis onto the anterior tibial artery.  The anterior tibial artery then was patent as the dorsalis pedis artery into the foot.  Next, the end-hole catheter was removed and right leg runoff was obtained via the right femoral sheath.  This revealed complete occlusion of the superficial femoral artery in the mid-thigh with reconstitution at the adductor canal.  The above-knee popliteal artery was patent as was the at-knee popliteal.  There was a stenosis in the popliteal artery in the midsection just behind the knee.  All three trifurcation vessels were occluded at their takeoff and there was reconstitution of a distal diseased anterior tibial artery.  This had severe stenoses throughout its course, with the anterior tibial being open onto  the ankle at the dorsalis pedis as well.  Patient tolerated the procedure without immediate complication and was transferred to the holding area in stable condition.  FINDINGS: 1. Widely patent aortoiliac segments. 2. Left native above-knee popliteal-superficial artery junction stenosis. 3. Patent left below-knee popliteal-to-distal-anterior-tibial artery bypass    with a distal stenosis at the  anastomosis. 4. Right superficial femoral artery occlusion with reconstitution of the    popliteal artery with occlusion of all tibial vessels, with reconstitution    of a diseased distal anterior tibial artery. DD:  08/21/99 TD:  08/25/99 Job: [id number]

## 2010-07-31 NOTE — Group Therapy Note (Signed)
   NAME:  MASHAYLA, LAVIN                            ACCOUNT NO.:  [id number]   MEDICAL RECORD NO.:  [id number]                   PATIENT TYPE:  INP   LOCATION:  A209                                 FACILITY:  [facility]   PHYSICIAN:  Angus G. Renard Matter, M.D.              DATE OF BIRTH:  04/18/1933   DATE OF PROCEDURE:  DATE OF DISCHARGE:                                   PROGRESS NOTE   SUBJECTIVE:  This patient has been treated for E coli, urinary tract  infection, poorly controlled diabetes, sacral decubitus, hypertension.  She  feels better and stronger.   OBJECTIVE:  VITAL SIGNS:  Blood pressure 18/64, respirations 18, pulse 63,  temperature 99.4.  LUNGS:  Diminished breath sounds.  HEART:  Regular rhythm.   ASSESSMENT:  The patient has been treated for urinary tract infection.  She  also has a history of diabetes mellitus and hypertension.  Was thought to  have slurred speech at the time of admission and possible transient ischemic  attack.   PLAN:  Continue current regimen.                                               Angus G. Renard Matter, M.D.    AGM/MEDQ  D:  06/09/2002  T:  06/10/2002  Job:  [id number]

## 2010-07-31 NOTE — Group Therapy Note (Signed)
NAME:  [name], [name]                            ACCOUNT NO.:  [id number]   MEDICAL RECORD NO.:  [id number]                   PATIENT TYPE:  INP   LOCATION:  A312                                 FACILITY:  [facility]   PHYSICIAN:  Mila Homer. Sudie Bailey, M.D.           DATE OF BIRTH:  1933/11/26   DATE OF PROCEDURE:  DATE OF DISCHARGE:                                   PROGRESS NOTE   SUBJECTIVE:  She really has no complaints again today.  Is not hurting  anywhere.  The patient is on multiple medications which include atenolol 50  mg daily, benazepril 20 mg daily, hydrochlorothiazide 12.5 mg daily, Kay  Ciel 20 mEq b.i.d., Protonix 400 mg daily, simvastatin 400 mg daily, Colace  100 mg b.i.d., folic acid 1 mg daily, enoxaparin 70 mg subcu daily with  Humulin N insulin 26 units in the a.m. and 20 units at 6 p.m.   OBJECTIVE:  She is very, very sleepy, hard to arouse, but when she is  aroused, she can talk with normal sentence structure and without slurring of  her speech.  The lungs are clear throughout, moving air well.  The heart has  a regular rhythm of about 70.  The abdomen is soft without  hepatosplenomegaly or mass.  There is no edema in the right leg; the left  leg is surgically absent with a left AKA.   Vital signs today show a temperature of 97 degrees, pulse 60, respiratory  rate 20, blood pressure 150/60 and glucoses were 241/187/110.  Her blood  gases today showed pH 7.47, PCO2 of 38, PO2 71 and O2 saturation of 94%, up  from 78% yesterday when she had a PO2 of 45.  Her H&H were 9.0/26.5.   She was seen by Dr. Darleen Crocker A. [place], neurologist, today and after reviewing  her record, he ordered a MRA of the brain which showed a small amount of  blood in the left lenticular nucleus and IC infarct.  Her MRI has shown  three infarcts, mainly in the posterior circulation.   ASSESSMENT:  1. 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  2. Type 2 diabetes.  3.  Escherichia coli urinary tract infection.  4. Essential hypertension.  5. Anticoagulation with Lovenox and aspirin.  6. Anemia.  7. Hypercholesterolemia.  8. Constipation.   PLAN:  Will follow neurology's recommendation.  Decadron might be useful,  though it will probably cause worsening of her hyperglycemia, and note, I  reviewed her computer record including vital signs, nursing notes and  medications, reviewed her paper record, discussed the findings with her  husband and daughter at length over about 20 minutes in the room, and  reviewed Dr. Ronal Fear notes on the paper record and his full dictated  noted in the electronic record.  This all took one-half hour.  Mila Homer. Sudie Bailey, M.D.   SDK/MEDQ  D:  06/20/2002  T:  06/21/2002  Job:  [id number]

## 2010-07-31 NOTE — Discharge Summary (Signed)
NAME:  [name], [name]                            ACCOUNT NO.:  [id number]   MEDICAL RECORD NO.:  [id number]                   PATIENT TYPE:  INP   LOCATION:  A209                                 FACILITY:  [facility]   PHYSICIAN:  Mila Homer. Sudie Bailey, M.D.           DATE OF BIRTH:  [birth year]   DATE OF ADMISSION:  06/04/2002  DATE OF DISCHARGE:                                 DISCHARGE SUMMARY   HISTORY OF PRESENT ILLNESS:  This 77 year old woman was admitted to the  hospital with symptoms suggestive of a CVA.  She had benign repeat  hospitalization.  She has small right internal capsule hypodensity  consistent with acute infarction.  She had some mucoperiosteal thickening in  the single left ethmoid area cell consistent with myoclonic sinusitis.  Carotid ultrasound showed bilateral carotid plaque formation which did not  call a significant luminal stenosis with flow with oxygen.  CT of the chest  showed the area with diameter throughout being about 2.5 cm.  There was  numeral calcification of the thoracic aorta.  No symptoms of mild  enlargement of the left ventricle, instant bibasilar subsegmental  atelectasis.   LABORATORY DATA:  Sodium 126, potassium 3.6, glucose 677, chloride is also  low at 83.  She had BUN of 22, creatinine 1.7, however.  CBC showed H&H of  14.3 and 42.6.  PT/PTT/INR were normal.  Bilirubin was slightly elevated at  1.5.  Albumin slightly low at 3.2.  Urinalysis was yellow, hazy with  specific gravity of 104, trace ketones, trace bloods, 500 glucose.  Small  leukocytes but negative nitrites.  Drug screen was essentially negative.  Just before that UA, microscopic urinalysis showed 11-20 WBCs and few  bacteria.  She had a small amount of acetone in the blood.  Blood cultures  negative.  Urine cultures greater than 100,000 E. coli.  Great florae  antibody was 0.37.  Cardiac panel showed at least one occasion of troponin  level 0.96.  Other enzymes were negative.   Lipid profile was normal.  Her  repeat MET-7 showed a sodium of 136, potassium 3.1, glucose 246.  A1C was  15.2%.  Homocysteine level was 18.34.  Vitamin B-12 913.  A metabolic acid  level was 0.12.  Urine CNS note that E. coli greater than 100,000.  Sensitive already tested.  Repeat CBC after rehydration showed H&H of 12.2  and 36.8.   HOSPITAL COURSE:  Treatment in the hospital included ASA 325 mg daily,  Plavix 75 mg daily, NPH insulin 20 units q.12h.  IV normal saline 100 cc an  hour was given with IV Levaquin 500 mg daily, Benazepril 20 mg daily, HCTZ  12.5 mg daily, Zocor 40 mg daily.  Accu-Check q.6h. with sliding scale  insulin coverage.  When hypokalemia was discovered [employer] was added to  each liter of IV fluid.  By the third day, she was doing better.  Her IV was  discontinued.  She was switched to heparin lock, put on Kay Ciel 20 mEq  daily p.o. and switched to Levaquin 500 mg p.o. daily.  It was noted that  she had a very small sacral decubitus.  Decubitus care was started.  It was  recommended she could ambulate in the room b.i.d.  Cardiology was called in.  On her fourth day, K-Dur was increased to 20 mg b.i.d. and Cardiology, Dr.  Dionicio Stall, started her on Atenolol 50 mg daily and arranged for the CT  scan of her aorta due to enlarged aorta on echo.  On the following day, she  was put on Protonix 40 mg daily, Reglan 500 a.c. and q.h.s., Phenergan 25 mg  q.4h. for nausea.  Humulin was changed from b.i.d. to Humulin N 26 units  q.a.m. and 20 units q.6 p.m. with Accu-Cheks a.c. and h.s.  Paxil 20 mg  daily was also added due to depression.  The patient is much improved by her  eighth day 06/11/02.  The patient is ready for discharge home.  Arrangements  were made for discharge on Humulin N 26 units q.a.m. and 20 units q.6 p.m.,  Reglan 5 mg a.c. and q.h.s., Protonix 40 mg daily, Paxil 20 mg daily,  Atenolol 50 mg daily, Kay Ciel 20 mEq b.i.d., Benazepril 20 mg daily, HCTZ   12.5 mg daily, Zocor 4 mg daily.  She is to stick to a low-carbohydrate and  no sweets added diet.  She is to check her sugars b.i.d. with follow up in  the office within the week.   DISCHARGE DIAGNOSES:  1. E. coli urinary tract infection.  2. Dehydration.  3. Electrolyte abnormalities including hyponatremia, hypochloridemia.  4. Insulin-dependent diabetes mellitus.  5. Peripheral vascular disease status post left below-the-knee amputation.  6. Hypercholesterolemia.  7. Depression.  8. Abdominal pain probable secondary to diabetic gastroparesis.                                               Mila Homer. Sudie Bailey, M.D.    SDK/MEDQ  D:  06/11/2002  T:  06/11/2002  Job:  [id number]   cc:   Vida Roller, M.D.  Fax: [phone]

## 2010-07-31 NOTE — Group Therapy Note (Signed)
NAMENOELLY, LASSEIGNE                              ACCOUNT NO.:  [id number]   MEDICAL RECORD NO.:  [id number]                   PATIENT TYPE:   LOCATION:                                       FACILITY:   PHYSICIAN:  Mila Homer. Sudie Bailey, M.D.           DATE OF BIRTH:   DATE OF PROCEDURE:  06/17/2002  DATE OF DISCHARGE:                                   PROGRESS NOTE   SUBJECTIVE:  The patient is almost unarousable today.  The family had been  in earlier.  She stayed unarousable and they were concerned.  We had taken  her off Paxil and Reglan recently and put her on Wellbutrin 150 b.i.d.  starting yesterday due to her fatigue.   When I talked with the patient this morning she tells me that she did not  sleep well last night.   She is on multiple meds including: For hypertension Atenolol 50 mg daily,  benazepril 20 mg daily, HCTZ 12.5 mg daily; for stroke and DVT prophylaxis  ASA 325 mg daily and Lovenox 75 mg daily; for stress ulcer prevention  Protonix 40 mg daily; the Wellbutrin for depression as mentioned above; for  her diabetes insulin 70/30, 26 units in the a.m. and 20 units at 6 p.m.; for  constipation Colace 100 mg b.i.d. and Dulcolax p.r.n.; for her E. coli UTI  Levaquin 5 mg daily; and folic acid 1 mg daily; multivitamins daily.   OBJECTIVE:  Today she is very lethargic, but I can wake her up and she is  able to talk to me and actually makes sense with normal sentence structure.  No slurring of the speech.  She is able to answer questions correctly.  Temperature 97.6, pulse 61, respiratory rate 16, blood pressure 114/61.  She  is well-developed, well-nourished, in no acute distress.  Left BKA noted  again.  Glucoses this morning were acceptable including one around 80 at the  time that the nurses checked her.  Glucoses in the last 24 hours were 60,  109, 136, and 123.  Today's physical exam shows her lungs clear, moving air  well.  The heart is a regular rhythm without  murmurs.  Rate of 60.  The  abdomen is soft without hepatosplenomegaly or masses.  No tenderness.  There  is no edema of the right foot.  Her neck was supple today.   ASSESSMENT:  1. Confusion, fatigue, question secondary to depression or Wellbutrin.  No     sign of hyperglycemia.  2. Escherichia coli urinary tract infection.  Now she is being treated for     over 10 days.  3. Type I diabetes mellitus well controlled on insulin.  4. Depression.  5. Essential hypertension.  6. Constipation.   PLAN:  We are discontinuing the Wellbutrin.  We will also discontinue the  Levaquin at this time.  If she persists in her fatigue we will  get  neurological second opinion.   NOTE:  I spent 25 minutes with this patient today on review of her  electronic medical record on the computer, review of her paper record,  discussion with nursing, talking with the patient while I examined the  patient, and formulating a treatment plan and dictating on her.                                               Mila Homer. Sudie Bailey, M.D.    SDK/MEDQ  D:  06/17/2002  T:  06/18/2002  Job:  [id number]

## 2010-07-31 NOTE — Group Therapy Note (Signed)
NAME:  [name], [name]                            ACCOUNT NO.:  [id number]   MEDICAL RECORD NO.:  [id number]                   PATIENT TYPE:  INP   LOCATION:  A209                                 FACILITY:  [facility]   PHYSICIAN:  Mila Homer. Sudie Bailey, M.D.           DATE OF BIRTH:  August 26, 1933   DATE OF PROCEDURE:  06/08/2002  DATE OF DISCHARGE:                                   PROGRESS NOTE   HISTORY OF PRESENT ILLNESS:  She is still weak and tired.  Tells me she has  been up, has walked with physical therapy all the way up the hall towards  the nurses station and back.  She denies crying spells at home.  I talked  with the patient and her husband in the room, he says that she would just  stop taking her insulin for days, and he just realized this recently.  Apparently has no problem getting hold of medication.  She complained of  some nausea and some lower abdominal discomfort today.   PHYSICAL EXAMINATION:  VITAL SIGNS:  Temperature is 99.4, pulse 66,  respiratory rate 20, blood pressure 163/80.  GENERAL:  Affect is slightly  depressed.  She appears to be oriented and alert.  Well-developed, well-  nourished, in no acute distress today.  HEART:  Regular rhythm, heart sounds  somewhat faint, rate around 70.  LUNGS:  Clear, moving air well.  ABDOMEN:  Soft without hepatosplenomegaly or mass.  EXTREMITIES:  There is no edema of  the right leg or ankle.  The left distal leg is surgically absent from her  below-the-knee amputation.   LABORATORY DATA:  Review of her blood work showed a potassium of 2.7  yesterday.  She also had a CT scan of her abdominal aorta arranged through  Dr. Dorethea Clan.  This showed the aorta somewhat enlarged, but no sign of  aneurysm.   ASSESSMENT:  1. Escherichia coli urinary tract infection.  2. Insulin-dependent diabetes mellitus.  3. Peripheral vascular disease, status post left below-the-knee amputation.  4. Hypokalemia.  5. Depression.   PLAN:  I discussed  the case with Dr. Dionicio Stall, cardiologist, at length  this morning in the hospital.  He felt based on the studies done and  evaluation by himself and Dr. Dietrich Pates, that she did not have any acute  cardiac problems.  I discussed her home situation at length with her husband  as noted above.   PLAN:  We will continue with her Levaquin 500 mg p.o. daily for the  Escherichia coli urinary tract infection.  Given her nausea, I am adding  back Reglan 5 mg a.c. and h.s., and also Protonix 40 mg daily.  She needs to  get up in the chair at least b.i.d. and ambulating.  Dr. Renard Matter I called  yesterday, increased her potassium from 20 mEq daily  to 20 mEq b.i.d.  We will recheck another Met-7  today and in the morning is  necessary.  She may be total volume potassium depleted, as well as just  hypokalemic.   I spent 1/2 hour on her coordination of care today.                                               Mila Homer. Sudie Bailey, M.D.    SDK/MEDQ  D:  06/08/2002  T:  06/09/2002  Job:  [id number]

## 2010-07-31 NOTE — Group Therapy Note (Signed)
NAME:  [name], [name]                            ACCOUNT NO.:  [id number]   MEDICAL RECORD NO.:  [id number]                   PATIENT TYPE:  INP   LOCATION:  A312                                 FACILITY:  [facility]   PHYSICIAN:  Mila Homer. Sudie Bailey, M.D.           DATE OF BIRTH:  1933/04/21   DATE OF PROCEDURE:  06/18/2002  DATE OF DISCHARGE:                                   PROGRESS NOTE   SUBJECTIVE:  She has no complaints today.  Her husband who is with her says  that she was very awake part of yesterday and then resumed her general  sleepiness.  She is awake somewhat today.   She needs atenolol 50 mg daily with benazepril 20 mg daily, HCTZ 12.5 mg  daily and KCl 20 mEq b.i.d. for her hypertension, Protonix 400 mg daily for  stress ulcer prevention, simvastatin 40 mg daily for hypercholesterolemia,  Colace 100 mg b.i.d. for constipation, folic acid 1 mg multivitamins daily  and enoxaparin 70 mg subcu daily for DVT prevention.  She is on insulin 26  units in the a.m. and 26 in the p.m.   OBJECTIVE:  She is semirecumbent in bed, in no acute distress, well-  developed, well-nourished.  She has a left BKA.  Temperature is 98.6, pulse  60, respiratory rate 16, blood pressure 35/69.  Glucoses were 85, 150, 166,  154 in the last 24 hours.   She speaks without any slurring of her speech.  Sentence structure intact,  but looks very sleepy, closed her eyes readily.  The lungs are clear  throughout.  The heart has a regular rhythm with a rate of 70.  The abdomen  is soft without hepatosplenomegaly or mass.  There is no tenderness.  There  is no edema of the right leg.  Of course, the left leg is surgically absent.  She is still having swelling of the entire right arm.  She has marked  bruising of the medial aspect of the right upper arm, some of the volar  surface of the right arm with some pitting edema of the arm and hand, and  really cannot move it on my command even though she can move  the right leg.   ASSESSMENT:  1. Confusion, question the etiology.  2. Escherichia coli urinary tract infection now off Levaquin.  3. Type 2 diabetes, well controlled on insulin.  4. Essential hypertension which is well controlled.  5. Constipation.   PLAN:  Discussed with her husband at length.  Will arrange MRI of the brain  as this has persistent.  Asked Dr. Gerilyn Pilgrim, neurologist, to see her to make  sure that she is moving her bowels.  Continue other meds.   NOTE:  Review of her computer record including vital signs, nursing notes  and medications, paper record, discussion with her husband and with the  patient, examined the patient, and formulation of  the plan took 25 minutes.                                               Mila Homer. Sudie Bailey, M.D.    SDK/MEDQ  D:  06/18/2002  T:  06/19/2002  Job:  [id number]

## 2010-07-31 NOTE — Op Note (Signed)
   NAME:  [name], [name]                            ACCOUNT NO.:  [id number]   MEDICAL RECORD NO.:  [id number]                   PATIENT TYPE:  INP   LOCATION:  A209                                 FACILITY:  [facility]   PHYSICIAN:  Lionel December, M.D.                 DATE OF BIRTH:  11-22-1933   DATE OF PROCEDURE:  11/04/2002  DATE OF DISCHARGE:                                 OPERATIVE REPORT   PROCEDURE:  Esophagogastroduodenoscopy.   INDICATION:  [name] is a 77 year old, African-American female, with upper  GI bleed.  She is undergoing diagnostic EGD.  Procedure risks were reviewed  with the patient.  Informed consent was obtained.   PREOPERATIVE MEDICATIONS:  1. Cetacaine spray for pharyngeal topical anesthesia.  2. Demerol 25 mg IV.  3. Versed 3 mg IV.   FINDINGS:  Procedure performed in endoscopy suite.  The patient's vital  signs and O2 saturations were monitored during the procedure and remained  stable.  Patient was placed in left lateral decubitus position, and Olympus  endoscope was passed via oropharynx without any difficulty into esophagus.   ESOPHAGUS:  Mucosa of the esophagus was normal throughout.  Squamocolumnar  junction was unremarkable but wavy.  There was 3 cm long sliding hiatal  hernia.   STOMACH:  It was empty and distended very well with insufflation.  Folds of  proximal stomach are normal.  Examination of mucosa revealed few red mucosal  patches along the greater curvature and felt to be due to NG trauma.  There  are two large erosions at the gastric body along the posterior wall.  Antral  mucosa was normal.  Pyloric channel was patent.  Angularis, fundus, and  cardia were examined by retroflexion of scope and were normal.   DUODENUM:  Examination of the bulb and second part of the duodenum was  normal.  Endoscope was withdrawn.   FINAL DIAGNOSES:  1. Small sliding hiatal hernia.  2. Erosive gastritis.   These are insignificant findings which may or  may not explain her coffee-  ground emesis, but I do not feel that would explain her fresh blood per  rectum.    RECOMMENDATIONS:  1. H. Pylori serology will be checked.  2. We will talk with patient regarding colonoscopy.                                               Lionel December, M.D.    NR/MEDQ  D:  11/04/2002  T:  11/04/2002  Job:  [id number]   cc:   Tesfaye D. Felecia Shelling, M.D.  [address]  [place]  [place] [address]  Fax: [phone]

## 2010-07-31 NOTE — Group Therapy Note (Signed)
   NAME:  [name], [name]                            ACCOUNT NO.:  [id number]   MEDICAL RECORD NO.:  [id number]                   PATIENT TYPE:  INP   LOCATION:  A340                                 FACILITY:  [facility]   PHYSICIAN:  Mila Homer. Sudie Bailey, M.D.           DATE OF BIRTH:  09-12-1933   DATE OF PROCEDURE:  05/18/2002  DATE OF DISCHARGE:                                   PROGRESS NOTE   SUBJECTIVE:  The patient admitted to the hospital about a day ago after 36  hours of nausea and vomiting.  She is now on IV fluids, feels much better.   OBJECTIVE:  VITAL SIGNS:  Temperature 97.9, pulse 84, respiratory rate 20,  blood pressure 197/81.  GENERAL:  She is oriented and alert, no acute distress, well-developed, well-  nourished.  HEART:  Regular rhythm, rate of 80.  LUNGS:  Clear throughout, moving air well.  ABDOMEN:  Soft without hepatosplenomegaly or mass.  There is no abdominal  tenderness.  EXTREMITIES:  She has a left BKA.  On the right, she has got boring  discoloration of the distal leg, but there is no edema in the ankle or foot.   LABORATORY DATA:  Her glucoses today would be 192, 272, 155, and 182 (prior  to her admission, glucose was 554 in the ER over a day ago).   PLAN:  Esophagogastroduodenoscopy today per Dr. Jena Gauss to evaluate for  diabetic gastroparesis.  Continue the Protonix 40 mg intravenously q.24h.  with lisinopril 10 mg p.o. daily and p.r.n. clonidine and Promethazine.  Continue with her insulin.  Hopefully will be able to be discharged either  later today or tomorrow.                                               Mila Homer. Sudie Bailey, M.D.    SDK/MEDQ  D:  05/18/2002  T:  05/18/2002  Job:  [id number]

## 2010-07-31 NOTE — Group Therapy Note (Signed)
NAME:  [name], [name]                            ACCOUNT NO.:  [id number]   MEDICAL RECORD NO.:  [id number]                   PATIENT TYPE:  INP   LOCATION:  A312                                 FACILITY:  [facility]   PHYSICIAN:  Mila Homer. Sudie Bailey, M.D.           DATE OF BIRTH:  Aug 04, 1933   DATE OF PROCEDURE:  06/15/2002  DATE OF DISCHARGE:                                   PROGRESS NOTE   SUBJECTIVE:  The patient is not saying much today.  Finally when she wakes  up she is able to answer some questions.  Her husband says that she was  talking and pretty active with the family yesterday, but today she has just  been sleeping all of the time.   She continues on Atenolol 50 mg daily, benazepril 20 mg daily and HCTZ 12.5  mg daily for hypertension.  She has been on Plavix and ASA for blood  thinning; but, as of yesterday, I switched her to Lovenox 1 mg/kg subcu  q.12h. There is a question of DVT of the right arm.   Concerning her E. coli UTI, she is on Levaquin 5 mg p.o. daily.   Concerning the question of diabetic gastroparesis, she is on Reglan 5 mg  q.i.d. and Protonix 40 mg daily.   Concerning her diabetes, she is on insulin 70/30, 26 units in the a.m. and  26 in the p.m.   Concerning her hyperglycemia, she is on simvastatin 40 mg p.o. daily.  She  is also on Paxil 20 mg daily for depression.   Concerning the swelling of the right arm, the husband notes that several  days ago as the nurse was taking the IV out she had bleeding that was  difficult to stop so they put a pressure wrap on the arm.   OBJECTIVE:  Her temperature is 100.5 today; pulse 72; respiratory rate 20;  blood pressure 175/80.  She is well-developed and well-nourished in no acute  distress tonight. She says that she is having no pain anywhere, no problems.  She has a left BKA.  She appears to be sleepy, but wakes up during the exam.  She is oriented in some ways.  She knows the town we are in [place], but  does not remember the name of the hospital.  She does not know the name of  the President of the [place] or the [address] of [place].  She  knows that Octavio Manns is in [place].  She knows that it is 2004 but does not  know what season of the year it is.   Careful examination of the right arm shows it slightly less swollen than  yesterday.  There is extensive bruising of the medial upper arm and also  bruising of the volar surface of the right forearm.  She is unable to raise  the arm.  There is extensive swelling and edema with  apparent edema of the  right arm and forearm, with swelling of the dorsum of the hand and even of  the fingers. There is also some bruising in the left arm near the  antecubital fossa.  Heart has a fairly regular rhythm with a rate of about  70.  The lungs are clear with moving air well.  The abdomen is soft without  hepatosplenomegaly or masses.  No abdominal pain.  There is no edema of the  distal right leg. She has a left BKA.  Dopplers of the right upper extremity  were negative for DVT.   Blood work today showed bedside glucose of 74, 249, 168, and 212.    ASSESSMENT:  1. Sleepiness and mild confusion which may be secondary to fairly new     institution of Paxil, using Reglan as well.  2. Ecoli UTI, treated with Levaquin 500 mg p.o. daily.  3. Swelling of the right arm and forearm secondary to bleeding of the     tissues.  4. Insulin-dependent diabetes, well controlled.  5. Essential hypertension.  6. Diabetic gastroparesis.   PLAN:  We will use a K-pad to the right forearm and arm.  We will  discontinue the Reglan and Paxil and start on Wellbutrin SR 150 mg p.o.  b.i.d. as long as there are no drug interactions.  We will continue to get  her to sit up in the chair b.i.d.   NOTE: I spent 30 minutes in reviewing the patient's computer record  including vital signs, meds, and nursing notes; reviewing her paper record;  talking to the patient  and her husband in her room; examining the patient;  reviewing the orders in the paper chart; writing new orders; and, in  general, coordination of care.                                               Mila Homer. Sudie Bailey, M.D.    SDK/MEDQ  D:  06/15/2002  T:  06/17/2002  Job:  [id number]

## 2010-07-31 NOTE — H&P (Signed)
[name], [name]                              ACCOUNT NO.:  [id number]   MEDICAL RECORD NO.:  [id number]                  PATIENT TYPE:   LOCATION:                                       FACILITY:   PHYSICIAN:  Tesfaye D. Felecia Shelling, M.D.              DATE OF BIRTH:   DATE OF ADMISSION:  11/03/2002  DATE OF DISCHARGE:                                HISTORY & PHYSICAL   CHIEF COMPLAINT:  Coffee-grounds vomiting and rectal bleeding.   HISTORY OF PRESENT ILLNESS:  This is a 77 year old black female with a  history of multiple medical illness.  She was brought to the emergency room  from [facility].  The patient was in her usual state of health  until last night when she started having recurrent vomiting.  The patient  developed bright red rectal bleed.  Her vomiting changed into coffee-  grounds.  The patient was then brought to the emergency room where she was  evaluated.  She was noted to have fresh, bright rectal bleed.  Her vomiting  was also positive for heme.  The patient also had very low potassium level.  She was started on IV fluid and potassium supplement.  The patient is then  admitted for further evaluation.   REVIEW OF SYSTEMS:  The patient has no history of GI bleed in the past.  She  had no fever, cough, chest pain or abdominal pain.  No dysuria, urgency, or  frequency of urination.   PAST MEDICAL HISTORY:  1. Diabetes mellitus type 2.  2. Peripheral vascular disease and status post bilateral below-knee     amputation.  3. Left stump wound.  4. Hypertension.  5. Chronic obstructive pulmonary disease.  6. Depression disorder.   CURRENT MEDICATIONS:  1. NPH insulin 26 units subcu in the a.m. and 10 units subcu in the p.m.  2. Protonix 40 mg p.o. daily.  3. Ascorbic acid 250 mg p.o. daily.  4. Tenormin 50 mg p.o. daily.  5. Aspirin 81 mg p.o. daily.  6. Lotensin 30 mg p.o. daily.  7. Colace 100 mb p.o. daily.  8. Folic acid 1 mg p.o. daily.  9.  Hydrochlorothiazide 25 mg p.o. daily.  10.      Nephrox 150 mg p.o. daily.  11.      MiraLax 17 gm p.o. daily.  12.      Multivitamin 1 tablet p.o. daily.  13.      KCl 20 mg p.o. daily.  14.      Zocor 40 mg p.o. daily.   PERSONAL AND SOCIAL HISTORY:  The patient is married. She used to live with  her husband before she was admitted to [facility].  The patient had  a history of tobacco use; she smoked 40 years ago.  No history of alcohol  nor substance abuse.   PHYSICAL EXAMINATION:  GENERAL: The patient  is alert, awake, and acutely  sick looking.  VITAL SIGNS:  Blood pressure 195/90, pulse 85, respiratory 24, temperature  98 degrees Fahrenheit.  HEENT:  The pupils are equal and reactive.  NECK:  The neck is supple.  CHEST:  Clear lung fields with good air entry.  CARDIOVASCULAR: First and second heart sounds heard, no murmurs, no gallops.  ABDOMEN:  Soft and lax.  Bowel sounds are positive.  There was no  organomegaly.  EXTREMITIES:  Status post bilateral below-knee amputation.  There is a  dressing on the right stump.   LABS:  WBC 17.8, hemoglobin 13.8, hematocrit 40.2, and platelets 346.  PT  14.1, INR 1.1, PTT 35.0. Sodium 127, potassium 2.4, chloride 102, CO2 24,  glucose 355, BUN 12, creatinine 0.6, and calcium 8.8.  Urinalysis pH 5.5,  specific gravity 1.020, wbc's 7-10, bacteria few.   ASSESSMENT:  1. This is a 77 year old black female with a history of multiple medical     illnesses who was brought from [facility] due to coffee-grounds     bleeding and rectal bleed.  The patient definitely has a gastrointestinal     bleed probably triggered by her NSAID and Plavix.  2. Severe hypokalemia, probably secondary to the nausea and vomiting.  3. Diabetes mellitus.  4. Hypertension.  5. Peripheral vascular disease and status post bilateral below-knee     amputation.  6. Leukocytosis, etiology not clear at this time.   PLAN:  Would continue the patient on  IV fluid. Will start her on IV  Protonix.  Will monitor her CBC.  Will do a GI consult and will continue the  patient on her regular medications.                                               Tesfaye D. Felecia Shelling, M.D.    TDF/MEDQ  D:  11/03/2002  T:  11/03/2002  Job:  [id number]

## 2010-07-31 NOTE — Op Note (Signed)
[facility]  Patient:    [name], [name]                         MRN: [id number] Proc. Date: 09/02/99 Adm. Date:  [id number] Attending:  Alyson Locket                           Operative Report  PREOPERATIVE DIAGNOSIS:  Vein graft stenosis at distal anastomosis of prior left below-knee popliteal to distal anterior tibial artery bypass with superficial femoral artery critical stenosis.  POSTOPERATIVE DIAGNOSIS:  Vein graft stenosis at distal anastomosis of prior left below-knee popliteal to distal anterior tibial artery bypass with superficial femoral artery critical stenosis.  PROCEDURE: 1. Left femoral to above-knee bypass with 6 mm thin-walled stretch graft. 2. Vein patch angioplasty, distal anastomosis of prior-placed left below-knee    popliteal to distal anterior tibial artery bypass.  SURGEON:  Larina Earthly, M.D.  ASSISTANT:  Loura Pardon, P.A.  ANESTHESIA:  Epidural.  COMPLICATIONS:  None.  DISPOSITION:  To recovery room stable.  DESCRIPTION OF PROCEDURE:  The patient was taken to the operating room and placed in the supine position, where the area of the left groin and left leg were prepped and draped in a sterile fashion and an incision made over the groin, carried down to isolate the common, superficial femoral, and profunda femoris arteries.  Next, the anterior tibial distal anastomosis was exposed just above the ankle, and the patient has had a prior vein graft at this level, and the anterior tibial artery was exposed proximally and distally, and the vein graft was exposed as well.  Next, using a medial approach to the above-knee popliteal artery, the artery was exposed to the level behind the knee.  A tunnel was created from the above-knee popliteal incision to the area of the groin.  A 6 mm thin-walled stretch graft was brought through the tunnel.  The patient was given 7000 units of intravenous heparin.  After adequate  circulation time, the common, superficial femoral, and profunda femoris arteries were occluded.  The common femoral artery was opened with an 11 blade, extended longitudinally with Potts scissors.  The graft was spatulated and sewn end-to-side to the artery with a running 6-0 Prolene suture.  Clamps were removed, and a good anastomosis was encountered.  The graft was then tunneled down to the level of the above-knee popliteal artery. The artery was occluded proximally and distally and was opened with the 11 blade and extended longitudinally with Potts scissors.  The graft was cut to appropriate length and dimension, was sewn end-to-side to the artery with a running 6-0 Prolene suture.  Clamps were removed, and good flow was noted. Next, the distal anterior tibial artery was occluded proximal and distal to the prior incision and using an 11 blade with the vein graft occluded, the hood of the vein graft was opened through the level of stenosis.  A piece of saphenous vein tributary was brought onto the field and was spatulated and sewn to the patch angioplasty over the prior anastomosis with a running 7-0 Prolene suture.  A 2.5 dilator passed easily through the proximal and distal segment of the anastomosis.  Prior to the completion of the anastomosis, the usual flushing maneuvers were taken.  Anastomosis completed, and the patient given 50 mg protamine to reverse the heparin.  The wounds were closed with 2-0 and 3-0 Vicryl in the  subcutaneous tissue, and the skin was closed with skin clips.  A sterile dressing was applied, and the patient was taken to the recovery room in stable condition. DD:  09/02/99 TD:  09/04/99 Job: [id number]

## 2010-07-31 NOTE — Discharge Summary (Signed)
NAME:  [name], [name]                            ACCOUNT NO.:  [id number]   MEDICAL RECORD NO.:  [id number]                   PATIENT TYPE:  INP   LOCATION:  5150                                 FACILITY:  [facility]   PHYSICIAN:  Larina Earthly, M.D.                 DATE OF BIRTH:  05-22-1933   DATE OF ADMISSION:  09/20/2002  DATE OF DISCHARGE:  10/03/2002                                 DISCHARGE SUMMARY   ADMISSION DIAGNOSES:  1. Gangrene of the right foot not amenable to revascularization.  2. Status post peripheral vascular occlusive disease with left below-knee     amputation in November of 2001.  3. Adult onset diabetes mellitus, type 1, with a history of gastroparesis.  4. Hypertension.  5. Chronic obstructive pulmonary disease.  6. History of depression.   DISCHARGE DIAGNOSES:  1. Gangrene of the right foot not amenable to revascularization.  2. Status post peripheral vascular occlusive disease with left below-knee     amputation in November of 2001.  3. Adult onset diabetes mellitus, type 1, with a history of gastroparesis.  4. Hypertension.  5. Chronic obstructive pulmonary disease.  6. History of depression.  7. Postoperative bleeding from her stump and postoperative epistaxis.   PROCEDURES:  Right below-knee amputation on September 21, 1002.   BRIEF HISTORY:  The patient is a 77 year old black female, a medical patient  of Dr. Wynn Banker, referred for evaluation of a necrotic right foot.  The  patient is well known to Dr. Arbie Cookey from prior surgeries of the left lower  extremity back in 2001, which ultimately ended in a left BKA.  She has been  followed by Dr. Arbie Cookey.  Arteriogram showed that she had no possibility for  revascularization of the right lower extremity.  At that time, he  recommended proceeding with right below-knee amputation.  The patient was  unwilling at that point.  She has been doing well until about three days  prior to admission when her foot started  draining pus.  She was not able to  bear weight on it since then and she had some discomfort, but not a  significant amount.  She was seen by Dr. Arbie Cookey.  He saw her in the emergency  department here at [facility] and she was admitted for amputation.   PAST MEDICAL HISTORY:  1. CVA in March of 2004 with residual right upper extremity weakness.  2. Peripheral vascular occlusive disease, status post left BKA in November     of 2001.  3. COPD.  4. Hypertension.  5. Hyperlipidemia.  6. Diabetes mellitus, type 1, with gastroparesis.  She checks her blood     sugars once daily.  7. History of depression.   PAST SURGICAL HISTORY:  1. Benign breast lumpectomy.  2. Hysterectomy.  3. Cholecystectomy.  4. Procedures to the left lower extremity.   ALLERGIES:  PENICILLIN.  MEDICATIONS ON ADMISSION:  1. NPH insulin 26 units in the a.m. and 10 units in the p.m.  2. Protonix 40 mg daily.  3. Zocor 40 mg daily.  4. Lotensin 20 mg daily.  5. Hydrochlorothiazide 12.5 mg daily.  6. Tenormin 50 mg daily.  7. Plavix 75 mg daily.  8. Enteric-coated aspirin 325 mg one daily.   SOCIAL HISTORY:  She is married and lives with her husband and four  children.  She quit using tobacco 40 years ago.  She does not drink.  She  lives in a single-level dwelling with her husband and son.   For further history and physical, please see the dictated note.   HOSPITAL COURSE:  The patient was admitted, taken to the operating room the  next day, and underwent right below-knee amputation as described.  She  tolerated it well and returned to the floor.  She was placed on a PCA pump.  She remained relatively stable.  She had ongoing bleeding over the next  three to four days from the stump.  It ws not significant.  Eventually her  hemoglobin did drop to a level of 6.7.  She was subsequently transfused.  She continued to bleed through September 26, 2002, at which time the site was  injected with 1% lidocaine using  epinephrine and two sutures were placed.  The patient tolerated this well and her bleeding resolved.  She has  continued to make slow progress.  She was seen in consultation by  rehabilitation.  Based on her activity level and baseline information, it  was ultimately recommended that she be placed in a skilled nursing facility.  The patient has been fairly stable otherwise.  She had one episode of  epistaxis.  Her blood pressure was noted to be high.  This was treated  initially by increasing her hydrochlorothiazide.  We just increased her  Lotensin also.  Epistaxis was controlled with direct pressure and no further  intervention was required.  She was also treated with labetalol to help with  her blood pressure control.  By October 02, 2002, she was doing well and it was  Dr. Candie Chroman opinion that she could go to a nursing facility when a bed was  available.  The case manager has been working on this and a bed should be  available by tomorrow.  Will recheck her labs the a.m. prior to discharge.  If she is doing well, anticipate discharge in the a.m. to a skilled nursing  facility on October 03, 2002.   CURRENT MEDICATIONS:  1. Ascorbic acid 250 mg daily.  2. Tenormin 50 mg daily.  3. Aspirin 81 mg daily.  4. Lotensin 30 mg p.o. daily.  5. Colace one p.o. b.i.d.  6. Ensure one can b.i.d. between meals.  7. Folic acid 1 mg daily.  8. Hydrochlorothiazide 25 mg daily.  9. Niferex 150 mg daily with supper.  10.      MiraLax 17 g p.o. q.h.s.  11.      Multivitamins one daily.  12.      Protonix 40 mg daily.  13.      Potassium chloride 20 mEq daily.  14.      Zocor 40 mg p.o. daily at h.s.  15.      Tylox one to two p.o. q.4h. p.r.n. for pain.   FOLLOWUP:  The patient will return to see Dr. Arbie Cookey in two to three weeks.  Our office will call and arrange that followup.  Follow up with Dr. Sudie Bailey  per his direction.  LABORATORY DATA:  The most current set of labs are as follows:   Hemoglobin  9.1, hematocrit 27, white count 11.1.  The hematocrit got as low at 6.7.  BMET shows a sodium of 135, potassium 3.5, chloride 98, CO2 28, BUN 13,  creatinine 0.7, and glucose 142.   CONDITION ON DISCHARGE:  Improving.     Eber Hong, P.A.                 Larina Earthly, M.D.    WDJ/MEDQ  D:  10/02/2002  T:  10/02/2002  Job:  [id number]   cc:   Mila Homer. Sudie Bailey, M.D.  [address] [place], [place] [address]  Fax: [phone]   Erick Colace, M.D.  [address] [place]  [place] [address]  Fax: [phone]    cc:   Mila Homer. Sudie Bailey, M.D.  [address] [place], [place] [address]  Fax: [phone]   Erick Colace, M.D.  [address] [place]  [place] [address]  Fax: [phone]

## 2010-07-31 NOTE — Group Therapy Note (Signed)
[name], [name]                              ACCOUNT NO.:  [id number]   MEDICAL RECORD NO.:  [id number]                   PATIENT TYPE:   LOCATION:                                       FACILITY:   PHYSICIAN:  Mila Homer. Sudie Bailey, M.D.           DATE OF BIRTH:   DATE OF PROCEDURE:  06/25/2002  DATE OF DISCHARGE:                                   PROGRESS NOTE   SUBJECTIVE:  Generally she is better. She appears to be more awake.  She has  been able to raise her right arm.   She was expected to be discharged home, but is still weak and arrangements  have now been made for her to go down to [facility] which will  hopefully be done tomorrow.   She meanwhile continues on numerous medications including atenolol 50 mg  daily, benazepril 20 mg daily, HCTZ 12.5 mg daily, and KCl 20 daily for  hypertension, simvastatin 40 mg daily for hypercholesterolemia, ASA 325 mg  daily, Lovenox 70 mg subcu daily for stroke prevention, Protonix 40 mg daily  for prevention of stress ulcers, MB daily and folic acid daily. She is also  on Humulin N 26 units in the morning and 20 units at 6 p.m. She is now on  theophylline 300 mg b.i.d.   OBJECTIVE:  She is semirecumbent in bed. She is fairly wakeful.  She is  oriented and alert in no acute distress.  Well-developed, well-nourished  somewhat obese.  She is a left AKA.  Temperature 98.6, pulse 67, respiratory  rate 20, blood pressure 143/61 and glucose in the last day have been 132,  224, 71, and 93.  O2 saturation on room air is 97%.  Today's H&H is  8.6/26.3, platelet count 558,000, white cell count 8200.  Her CMT showed a  glucose of 67, BUN 20, creatinine 0.9, albumin is 2.3.   ASSESSMENT:  1. Status post posterior circulation cerebrovascular accident with resolving     fatigue.  2. Insulin-dependent diabetes mellitus.  3. Essential hypertension.  4. Peripheral vascular disease status post left above-knee amputation.  5. Status post  Escherichia coli.   PLAN:  Current meds: The patient should be able to get off Lovenox pretty  soon and onto Plavix when she is more ambulatory and her current rehab plan  for tomorrow.   I spent about 20 minutes with this patient tonight reviewing her electronic  and paper record, talked with the family including the husband and daughter  and the patient in the room, examined the patient, formulating a plan, and  dictating a note.                                               Mila Homer. Sudie Bailey, M.D.  SDK/MEDQ  D:  06/25/2002  T:  06/25/2002  Job:  [id number]

## 2010-07-31 NOTE — Group Therapy Note (Signed)
NAME:  [name], [name]                            ACCOUNT NO.:  [id number]   MEDICAL RECORD NO.:  [id number]                   PATIENT TYPE:  INP   LOCATION:  A209                                 FACILITY:  [facility]   PHYSICIAN:  Mila Homer. Sudie Bailey, M.D.           DATE OF BIRTH:  1933-07-09   DATE OF PROCEDURE:  DATE OF DISCHARGE:                                   PROGRESS NOTE   SUBJECTIVE:  The patient was not complaining of any pain today.  The patient  tells me she still has not urinated on her own.  She was due to be  discharged yesterday but was not able to urinate.  Finally she ended up with  a straight catheter we revealed 300 cc of urine.   The patient herself says she has not been walked by physical therapy.  She  had an IV in the right arm which was discontinued about three days ago and  switched to the left side.   OBJECTIVE:  GENERAL: She is semi-recumbent in the bed.  She appears to be  well-developed and well-nourished.  She is in no acute distress this  morning.  She appears to be alert.  EXTREMITIES: She has a left BKA.  She had __________  discoloration of the  distal right leg.  There is no edema.  There are signs of a healing  infection on the anterior surface of the right lower leg.  She has  significant edema extending from the right shoulder all the way down to the  right hand.  The right hand is puffy.  There is no heat.  No tenderness to  palpation.  The arm is of a wooden consistency.  She is able to raise the  arm slightly and move the wrist slightly.  HEART: Regular rate and rhythm at 70.  LUNGS: Appear clear throughout.  ABDOMEN: Soft with hepatosplenomegaly or mass.  There is truncal obesity.  There is no CVA or flank pain.   LABORATORY DATA:  Glucoses in the last 24 hours 72, 143, 170, and 125.   ASSESSMENT:  1. Escherichia coli urinary tract infection.  2. Type 2 diabetes well controlled on Humulin 70/30, 20 at 6 in the morning     and 20 at 6  p.m.  3. Inability to urinate.  Question etiology.  4. Edema of the right arm, probably secondary to infiltration of an IV.   PLAN:  Urinary catheterization.  I am calling and canceling her discharge  given these unresolved problems.  Physical therapy is to ambulate her with  assistance twice a day.                                               Mila Homer. Sudie Bailey, M.D.  SDK/MEDQ  D:  06/12/2002  T:  06/12/2002  Job:  [id number]

## 2010-07-31 NOTE — H&P (Signed)
NAME:  [name], [name]                            ACCOUNT NO.:  [id number]   MEDICAL RECORD NO.:  [id number]                   PATIENT TYPE:  INP   LOCATION:  5150                                 FACILITY:  [facility]   PHYSICIAN:  Larina Earthly, M.D.                 DATE OF BIRTH:  January 25, 1934   DATE OF ADMISSION:  09/20/2002  DATE OF DISCHARGE:                                HISTORY & PHYSICAL   PRIMARY CARE Reena Borromeo:  Mila Homer. Sudie Bailey, M.D.   REHABILITATION PHYSICIAN:  Erick Colace, M.D.   CHIEF COMPLAINT:  Dr. Wynn Banker sent me over because of my right foot.   HISTORY OF PRESENT ILLNESS:  This is a 77 year old African-American female  referred from Dr. Wynn Banker for evaluation of necrotic right foot.  [name]  is well known to Dr. Arbie Cookey from prior surgery of her left lower extremity,  the last in 2001 as well as during her recent hospital stay in March, April,  and May in 2004.  At the time of her hospitalization she was noted to have a  full thickness eschar over the entire dorsum of her right foot.  After her  discharge from the hospital she followed up with Dr. Arbie Cookey in the [facility]  office on May 27.  Dr. Arbie Cookey reviewed her arteriograms from 2001 and,  unfortunately, felt that there is no possibility for revascularization of  her right lower extremity.  At that time he recommended proceeding with a  right below-knee amputation.  [name] was not willing to proceed at that  time.  Arrangements were made for her to follow up in the office in  approximately and to call, prior to that, if she should have more  difficulties.  She tells me today she has been doing okay, from her point of  view, until about 3 days ago when her right foot started draining pus.  She has not been able to bear weight on it since then. She does tell me,  however, that she has very little pain in her right foot.   Earlier today at a routine follow up appointment with Dr. Wynn Banker, he  briefly examined  her foot and recommended her to come to the emergency  department here at [facility] and he called Dr. Arbie Cookey to let him  know the condition of her foot.   PAST MEDICAL HISTORY:  1. Cerebrovascular accident in March 2004 with residual right upper     extremity weakness.  2. Peripheral vascular occlusive disease, status post left BKA November     2001.  3. COPD.  4. Hypertension.  5. Lipidemia.  6. Diabetes mellitus type 1 with gastroparesis.  She checks her blood sugars     once daily.  The last several days they have ranged 114-130.  7. She has a history of depression.   PAST SURGICAL HISTORY:  Benign  breast lumpectomy, hysterectomy, and  cholecystectomy.   ALLERGIES:  PENICILLIN.   MEDICATIONS PRIOR TO ADMISSION:  1. NPH insulin 26 units in the a.m. 10 units in the p.m.  2. Protonix 40 mg daily.  3. Zocor 40 mg daily.  4. Lotensin 20 mg daily.  5. HCTZ 12.5 mg daily.  6. Tenormin 50 mg daily.  7. Plavix 75 mg daily, last taken July 7.  8. Enteric coated aspirin 325 mg daily, last taken July 7.   SOCIAL HISTORY:  [name] is married.  She and her husband have 4 children.  She quit using tobacco 40 years ago.  She does not drink alcohol.  She lives  in a single-level dwelling with her husband and 1 son.  She does not drive.  She does have assistance available from family members.   FAMILY HISTORY:  Her mother died at age 54 from an abdominal injury.  Her  father died at 22 years old.  She has 2 brothers with diabetes mellitus.   REVIEW OF SYSTEMS:  GENERAL: In general, [name] tells me she feels very  well.  She has had no recent changes in her vision or hearing. No difficulty  chewing or swallowing.  RESPIRATORY:  She has no shortness of breath, no  cough, no chest pains.  GI:  Her appetite is good.  She has no regurgitation  symptoms.  No recent changes in her bowel habits.  At home she tries to  follow a diabetic diet.  GU:  She reports no dysuria.  She is  incontinent of  urine.  MUSCULOSKELETAL:  Until 3 days ago she was ambulating independently  with a left below-knee prosthesis.  She has had no recent fevers, no history  of blood clots.  PSYCHIATRIC: She has had no recent symptoms of depression,  she is sleeping well.   PHYSICAL EXAMINATION:  VITAL SIGNS:  Blood pressure on the left 158/57,  heart rate 77, respiratory rate 18, O2 saturation 96% on room air.  Her  temperature is 99.3 today.  GENERAL:  This is a 77 year old African-American female in no acute  distress.  HEENT:  Eyes PERRLA.  Oral mucosa is pink and moist.  Her dentition is in  good repair.  NECK:  Her neck is without any carotid bruits, thyromegaly or  lymphadenopathy.  CHEST:  Her breathing is unlabored.  Her breath sounds are clear  bilaterally.  CARDIOVASCULAR:  Her heart is in a regular rate and rhythm.  Her abdomen is  rotund. She has had positive bowel sounds.  ABDOMEN:  Her abdomen is soft and nontender.  EXTREMITIES:  Lower extremities: She has a well-healed left below-knee  amputation.  Right foot:  Dorsum is completely exposed including the tendon.  The foot is now odorous. Her right fifth toe on distal lateral foot is  gangrenous.  The heel is with a thick eschar.  There is no frank pus from  these sounds or any gross signs of infection.  NEUROLOGIC:  She is alert and oriented.  Her gait is not assessed.  Her  muscle strength is 2/5 in her right upper extremity and 4/5 in her left  upper extremity.   LABORATORY STUDIES:  Pending. They will include a CBC, BMET, PT, and INR.   IMPRESSION:  This is a 77 year old African-American female with gangrenous  and necrotic right foot not amenable to revascularization.    PLAN:  Admission tonight to [facility] to the care of Dr. Tawanna Cooler  Early.  It is Dr. Bosie Helper feeling that she will probably come to right below- knee amputation during this hospitalization.     Toribio Harbour, N.P.                   Larina Earthly, M.D.    CTK/MEDQ  D:  09/20/2002  T:  09/21/2002  Job:  [id number]   cc:   Larina Earthly, M.D.  [address]  [place]  [place] [address]  Fax: [phone]    cc:   Larina Earthly, M.D.  [address]  [place]  [place] [address]  Fax: [phone]

## 2010-07-31 NOTE — Op Note (Signed)
[place]. [facility]  Patient:    [name], [name]                         MRN: [id number] Proc. Date: 11/20/99 Adm. Date:  [id number] Attending:  Alyson Locket                           Operative Report  PREOPERATIVE DIAGNOSIS:  Ischemic left foot with occluded left femoral-to-popliteal and left popliteal-to-anterior tibial bypass.  POSTOPERATIVE DIAGNOSIS:  Ischemic left foot with occluded left femoral-to-popliteal and left popliteal-to-anterior tibial bypass.  PROCEDURE:  Attempted thrombectomy of prior bypass followed by placement of new left femoral to distal anterior tibial bypass 6 mm thin-walled Gore-Tex stretch graft, intraoperative arteriogram.  SURGEON:  Gretta Began, M.D.  ANESTHESIA:  Epidural.  COMPLICATIONS:  None.  CONDITION:  To recovery room stable.  PROCEDURE IN DETAIL:  The patient was taken to the operating room and positioned there.  The left groin, left leg were prepped and draped in sterile fashion.  An incision was made over the common femoral artery and carried down to isolate the prior femoral to distal popliteal above knee popliteal bypass. The graft was opened transversely near the arterial anastomosis.  The graft was thrombectomized.  There was sclerotic feeling popliteal artery and no significant back-bleeding.  For this reason, a separate incision was made over the anterior tibial graft as it exited the fascia on the lateral aspect over the anterior tibial artery.  The prior vein graft from the below-knee popliteal to the distal anterior tibial artery was exposed, opened with 11 blade transversely.  The vein graft was thrombectomized with a Fogarty catheter.  Again, there was poor back bleeding.  An arteriogram was obtained through the vein graft.  This revealed again stenotic anastomosis.  She had a stenosis of the prior anastomosis to the anterior tibial artery.  Also there was what appeared to be occlusion of  the dorsalis pedis artery to the foot.  Further attempts to remove further trauma from the dorsalis pedis artery was unsuccessful.  A separate incision was made over the distal anastomosis just above the ankle.  Anterior tibial artery was exposed just distal to the prior anastomosis and revision of the vein graft. Incision was made over the anterior tibial artery and a Fogarty catheter was passed well into the foot and remaining thrombus was removed.  The artery was good caliber.  A long Gore-Tex tunnel was then used to create a tunnel from the groin to the distal anterior tibial artery and a 6 mm thin walled Gore-Tex graft was brought through the tunnel.  The graft was spatulated from end-to-side to the distal anterior tibial artery with the running 6-0 Prolene suture.  Clamps were removed from the artery and this was flushed with Heparinized saline and reoccluded.  Next, the graft was sewn end-to-end to the old graft just distal to the femoral anastomosis with a running 6-0 Prolene suture.  Clamps were removed and good pulse was noted into the foot.  Good Doppler signal flow was noted as well.  The patient was given 50 mg of Protamine to reverse the Heparin.  The wounds were irrigated with saline and the wound was close with 3-0 Vicryl in the subcutaneous and subcuticular tissue.  In the distal incision the groin wounds were closed with several layers of 2-0 Vicryl suture.  The skin was closed with skin clips.  Sterile dressing was applied and the patient was taken to the recovery room in stable condition. DD:  11/20/99 TD:  11/22/99 Job: [id number]

## 2010-07-31 NOTE — H&P (Signed)
[place]. [facility]  Patient:    [name], [name]                         MRN: [id number] Adm. Date:  [id number] Attending:  Alyson Locket Dictator:   Marlowe Kays, P.A.                         History and Physical  DATE OF BIRTH:  1934/01/29  HISTORY OF PRESENT ILLNESS:  The patient is a 77 year old black female with a known history of PVOD status post left femoral to above-knee popliteal bypass graft in June 2001 followed with a revision of the left femoral tibial bypass graft by Dr. Arbie Cookey on November 20, 1999.  Unfortunately, her graft was not available for a thrombectomy.  She had no usable vein in the left leg, and it was felt to be risky to use any veins from her right leg, seeing as she has significant ischemia in the right, as well.  Dr. Arbie Cookey evaluated the patient, explaining that it would be unlikely that she would be able to heal her incision and to have a viable foot with no better flow into her foot. She now returns with increasing gangrenous changes in her left foot.  Dr. Arbie Cookey recommended left below-knee amputation which is scheduled for January 15, 2000.  In the visit prior to surgery she denies any radicular hip pain, no leg pain, no foot pain, no rest pain and no night pain.  There is some peripheral edema.  As well, there are gangrenous changes in her left foot and left pretibial area.  She denies any shortness of breath or dyspnea on exertion.  No chest pain or palpitations.  PAST MEDICAL HISTORY: 1. PVOD. 2. IDDM. 3. Hypertension. 4. Cataracts. 5. History of skin melanoma. 6. Claustrophobia.  PAST SURGICAL HISTORY: 1. Status post revision of the left femorotibial bypass graft by Dr. Arbie Cookey    November 20, 1999. 2. Status post left femoral to above-knee popliteal bypass graft by Dr.    Arbie Cookey September 04, 1999. 3. Status post femoral to distal anterior tibial bypass graft June 04, 1993. 4. Status post lumpectomy of the left  breast 1984. 5. Status post hysterectomy 1982. 6. Status post cholecystectomy 1986.  MEDICATIONS: 1. Humulin N 25 units q.a.m., 8 units q.p.m. 2. Ecotrin 325 mg p.o. q.d. 3. Multivitamins q.d. 4. Accupril 20 mg p.o. b.i.d.  ALLERGIES:  No known drug allergies.  REVIEW OF SYSTEMS:  See HPI and past medical history for significant positives.  The rest of her review of systems is essentially negative.  FAMILY HISTORY:  Mother died of cancer, apparently gastric cancer.  Father died of heart disease and a history of congestive heart failure at age 32.  A sister died of gastric cancer at age 30.  She also had one sister who died of complications of melanoma.  She also has one brother who died of myocardial infarction.  SOCIAL HISTORY:  Married.  Four children.  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  She used to smoke two packs of tobacco a week for 15 years but she quit 25 years ago.  She denies any alcohol intake.  PHYSICAL EXAMINATION:  GENERAL:  This is a well-developed, well-nourished 77 year old black female in no acute distress.  Alert and oriented x 3.  Quite anxious.  VITAL SIGNS:  Blood pressure 142/102.  Pulse 88.  Respirations 18.  HEENT:  Normocephalic, atraumatic.  PERRLA.  EOMI.  No cataracts or glaucoma. There is macular degeneration bilaterally.  NECK:  Supple.  No JVD, bruits, thyromegaly, lymphadenopathy.  CHEST:  Symmetrical on inspirations.  Lungs clear to auscultation bilaterally.  CARDIOVASCULAR:  Regular rate and rhythm, 1/6 systolic murmur in the left to right sternal border, decrescendo, though in intensity, no rubs or gallops.  ABDOMEN:  Soft, nontender.  Bowel sounds x 4.  No hepatosplenomegaly, abdominal bruits, or iliac bruits.  GENITOURINARY:  Deferred.  RECTAL:  Deferred.  EXTREMITIES:  There are gangrenous changes in the left foot and left pretibial area.  There is surrounding erythema as well as edema.  Peripheral pulses:   Carotid 2+ bilaterally without bruits, femoral 2+ bilaterally without bruits, popliteal, dorsalis pedis, and posterior tibialis nonpalpable.  NEUROLOGIC:  Nonfocal.  DTRs and gait not tested.  Muscle strength 5/5.  ASSESSMENT AND PLAN:  A 77 year old black female with significant history of pulmonary vascular obstructive disease, now with gangrenous changes in the left lower extremity, who will undergo left below-knee amputation by Dr. Arbie Cookey at [facility] on January 15, 2000.DD:  01/15/00 TD:  01/15/00 Job: [id number]

## 2010-07-31 NOTE — Group Therapy Note (Signed)
   NAME:  [name], [name]                            ACCOUNT NO.:  [id number]   MEDICAL RECORD NO.:  [id number]                   PATIENT TYPE:  INP   LOCATION:  A209                                 FACILITY:  [facility]   PHYSICIAN:  Mila Homer. Sudie Bailey, M.D.           DATE OF BIRTH:  1933/05/13   DATE OF PROCEDURE:  06/06/2002  DATE OF DISCHARGE:                                   PROGRESS NOTE   SUBJECTIVE:  She still feels somewhat weak but slightly stronger.  She is  now on IV fluids.  She has no complaint of pain although family was  concerned that she had developed an ulcer in her buttock region.   OBJECTIVE:  She appeared to be oriented and alert today.  Speech was still  somewhat slow but no slurring to speech.  Sentence structure intact.  Today  temperature is 99.4 and pulse 109, respiratory rate 18, blood pressure  168/79.  She is well-developed and well-nourished, no acute distress.  Left  BKA noted.  She was alert.  Heart had a regular rhythm, rate of about 80 on  my exam.  Lungs were clear throughout.  The abdomen was soft without  hepatosplenomegaly or mass.  There is no CVA or flank pain.  There is no  edema of the right distal leg.   Lab work showed she was growing greater than 100,000 E. coli.  Her A1c was  15.2.  Homocysteine level 18.34 (normal 5 to 13.9).  Glucoses measured on  the floor 112, 118, and 146.  EKG showed poor R wave progression across the  precordium, Q's in aVL.   ASSESSMENT:  1. Escherichia coli urinary tract infection.  2. Poorly controlled type 1 diabetes.  3. Sacral decubitus.  4. Essential hypertension.  Will level out because of IV normal saline.   PLAN:  Add Rocephin 1 gram IV q.24h.  Will actually discontinue the IV at  point, switch to Hep-Lock to avoid fluid overload.  Now that she is taking  better by mouth add KCl 20 mEq p.o. daily and continue on her medications  including Lotensin 10 mg daily, aspirin 325 mg daily, Plavix 75 mg daily,  folic acid 1 mg daily, hydrochlorothiazide 12.5 daily, Zocor 4 mg daily,  multivitamins daily, and NPH insulin 20 units subcu b.i.d.  CBC and MET-7  will be pending in the morning.  Also will check a B12 level and a  methylmalonic acid level.  Will have cardiology see her.                                               Mila Homer. Sudie Bailey, M.D.    SDK/MEDQ  D:  06/06/2002  T:  06/06/2002  Job:  [id number]

## 2010-07-31 NOTE — Group Therapy Note (Signed)
   NAME:  [name], [name]                            ACCOUNT NO.:  [id number]   MEDICAL RECORD NO.:  [id number]                   PATIENT TYPE:  INP   LOCATION:  A209                                 FACILITY:  [facility]   PHYSICIAN:  Angus G. Renard Matter, M.D.              DATE OF BIRTH:  10/17/1933   DATE OF PROCEDURE:  DATE OF DISCHARGE:                                   PROGRESS NOTE   HISTORY OF PRESENT ILLNESS:  This patient of Dr. Michelle Nasuti was admitted  through the ED with slurred speech.  Apparently, it was felt that she had  new onset cerebrovascular accident.  She also has problems with her  uncontrolled diabetes, hyponatremia, urinary tract infection, hypertension.   PHYSICAL EXAMINATION:  VITAL SIGNS:  Blood pressure 158/87, respirations 20,  pulse 84, temperature 98.7.  GENERAL:  The patient has generalized weakness.  LUNGS:  Clear to auscultation and percussion.  HEART:  Regular rhythm.  ABDOMEN:  Without masses.   LABORATORY DATA:  EKG showed a small lacunar infarct, right internal  capsule.   PLAN:  To continue current regimen.  The patient has also been seen by  cardiology.                                                Angus G. Renard Matter, M.D.    AGM/MEDQ  D:  06/07/2002  T:  06/08/2002  Job:  [id number]

## 2010-07-31 NOTE — Discharge Summary (Signed)
[name], [name]                  ACCOUNT NO.:  [id number]   MEDICAL RECORD NO.:  [id number]          PATIENT TYPE:  INP   LOCATION:  A336                          FACILITY:  [facility]   PHYSICIAN:  Tesfaye D. Felecia Shelling, MD   DATE OF BIRTH:  [birth year]   DATE OF ADMISSION:  DATE OF DISCHARGE:  10/25/2005LH                                 DISCHARGE SUMMARY   DISCHARGE DIAGNOSES:  1.  Acute viral gastroenteritis.  2.  Hyperkalemia.   SECONDARY DIAGNOSES:  1.  Diabetes mellitus.  2.  Hypertension.  3.  Chronic obstructive pulmonary disease.  4.  Peripheral vascular disease.  5.  Status post bilateral above-the-knee amputation.   DISCHARGE MEDICATIONS:  1.  KCl 10 mEq p.o. daily.  2.  Multivitamin one tablet p.o. daily.  3.  Lipitor 20 mg p.o. daily.  4.  Vitamin C 500 mg p.o. daily.  5.  Actos 30 mg p.o. daily.  6.  Lisinopril HCT 20/25 one tablet p.o. b.i.d.  7.  NPH insulin 26 units subcu in the a.m. and 10 units subcu q.p.m.  8.  Protonix 40 mg p.o. daily.  9.  Colace 100 mg p.o. daily.  10. Niferex150 mg p.o. daily.   DISPOSITION:  The patient will be discharged home in stable condition.   HOSPITAL COURSE:  This is a 77 year old patient with a history of multiple  medical illnesses who was admitted due to nausea, vomiting and diarrhea of  two days duration.  On admission, the patient was found to have a potassium  of 2.7.  The patient was supplemented with IV potassium.  She was also  started on IV fluid and was treated symptomatically for her gastroenteritis.  Over the hospital stay, the patient improved.  She was able to tolerate a  regular diet, and the patient is being discharged to home to continue her  regular treatments.     Tesf  TDF/MEDQ  D:  01/07/2004  T:  01/07/2004  Job:  [id number]

## 2010-07-31 NOTE — Discharge Summary (Signed)
[facility]  Patient:    [name], [name]                         MRN: [id number] Adm. Date:  [id number] Disc. Date: [id number] Attending:  Evern Core Dictator:   Loura Pardon, P.A.                           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  SECONDARY DIAGNOSES: 1. History of peripheral vascular occlusive disease. 2. Status post left femoral to above-knee popliteal bypass graft in June    2001. 3. Status post left femoral to tibial bypass graft September 2001. 4. Type 2 diabetes mellitus, insulin-dependent. 5. Hypertension. 6. History of cataracts. 7. History of skin melanoma. 8. Claustrophobia.  PROCEDURES:  January 15, 2000, left below-knee amputation, Dr. Tawanna Cooler Early. The patient tolerated the procedure well and was transferred in stable and satisfactory condition to the recovery room.  DISPOSITION:  Her postoperative course before discharge to rehabilitation lasted four days.  She was suitable for transfer to rehabilitation on postoperative day #4.  Her pain was well controlled with oral analgesia on an alternating regimen of Ultram 50 mg 2 tablets every four hours and Percocet 5/325 2 tablets every four hours.  She did have some nausea on postoperative day #3.  This was corrected with IV Phenergan.  She was also constipated, and this also resolved after administration of suitable laxative.  The left below-knee amputation site was healing with some bruising but no breakdown. The patient was alert and oriented, able to participate in rehabilitation protocols, and she was transferred on the medications that she had been taking as an outpatient.  MEDICATIONS: 1. For pain, Ultram 50 mg 2 tablets every four hours. 2. Percocet 5/325 to alternate with Ultram 2 tablets p.o. q.4h. 3. Humulin N  25 units subcutaneously every morning, and 8 units subcutaneously    every evening. 4. Ecotrin 325 mg daily. 5. Multivitamin daily. 6. Accupril 20 mg orally twice daily.  ACTIVITY:  Per rehabilitation protocols.  DIET:  Low sodium, low cholesterol, ADA diet.  WOUND CARE:  She may bathe daily, keeping her incisions clean and dry.  She will have staples removed approximately three weeks after her surgery date.  FOLLOW-UP:  She will see Dr. Arbie Cookey two weeks after discharge from rehabilitation.  HISTORY OF PRESENT ILLNESS:  Ms. [name] is a 77 year old African-American female.  She has a known history of infrainguinal arterial occlusive disease, most pronounced on the left lower extremity.  She is status post left femoral to above-knee popliteal bypass graft performed June 2001.  This was then revised subsequently on November 20, 1999, with a left femoral to tibial bypass graft.  Both surgeries were done by Dr. Gretta Began.  The patient returned with gangrenous changes to the left foot.  The graft which had been placed on November 20, 1999, was not amenable to thrombectomy, and she had no remaining usable vein in the left lower extremity for revisions.  It was felt that using vein from the right lower extremity with a pronounced ischemia to the right lower extremity would be counter-productive.  Dr. Arbie Cookey explained that any amputation below the ankle would probably not heal due to poor inflow to the left foot, so he  recommended a left below-knee amputation.  This was scheduled for January 15, 2000.  HOSPITAL COURSE:  Ms. [name] presented to [facility] on January 15, 2000.  She underwent left below-knee amputation the same day.  Her postoperative course before rehabilitation lasted four days.  She has been afebrile in the postoperative period.  Her incision shows some evidence of ecchymosis but no tendency toward breakdown.  She had postoperative pain which was  controlled at first with morphine and then with oral analgesia.  She was given IV antihypertensives for brief elevation of blood pressure on postoperative days #1 and 2.  By postoperative day #4, she was eating well. Her serum blood glucose was controlled with a Humulin R on a sliding scale basis as well as Humulin N twice daily, as she was taking at home.  She is on her Accupril twice daily medication.  Her pain is controlled with an alternating regimen of two tablets of Ultram alternating every four hours with two tablets of Percocet.  Her mental status has been clear.  The patient is able to participate in rehabilitation protocols.  She will have follow-up with Dr. Arbie Cookey two weeks after discharge from rehabilitation services. DD:  02/08/00 TD:  02/08/00 Job: [id number]

## 2010-07-31 NOTE — Op Note (Signed)
   NAME:  [name], [name]                            ACCOUNT NO.:  [id number]   MEDICAL RECORD NO.:  [id number]                   PATIENT TYPE:  INP   LOCATION:  5150                                 FACILITY:  [facility]   PHYSICIAN:  Larina Earthly, M.D.                 DATE OF BIRTH:  February 04, 1934   DATE OF PROCEDURE:  09/21/2002  DATE OF DISCHARGE:                                 OPERATIVE REPORT   PREOPERATIVE DIAGNOSIS:  Gangrene right foot.   POSTOPERATIVE DIAGNOSIS:  Gangrene right foot.   PROCEDURE:  Right below knee amputation.   SURGEON:  Larina Earthly, M.D.   ASSISTANT:  Eber Hong, P.A.   ANESTHESIA:  General endotracheal anesthesia.   COMPLICATIONS:  None.   CONDITION:  Stable.   PROCEDURE IN DETAIL:  The patient was taken to the operating room where the  area of the right leg was prepped and draped in the usual sterile fashion.  An incision was made several fingerbreadths below the tibial prominence on  the right leg and using a posterior based muscle flap, the skin incision was  extended around the posterior aspect of the calf.  The patient had good  bleeding from the muscle bellies and did not appear to have any evidence of  infection at this level.  There was a moderate amount of edema present.  The  fascia was divided and the anterior tibial muscle bodies were divided.  The  anterior tibial neurovascular bundle was ligated with 0 Vicryl ties and  divided.  The periosteum was elevated off the tibia.  The fibula was exposed  and the periosteum was elevated proximally, as well.  Next, the  gastrocnemius muscle was divided in line with the skin incision and the  soleus muscle was divided further proximally.  The popliteal artery was  doubly ligated with 0 Vicryl ties and divided.  The fibula was divided with  a bone shear and the tibia was divided with a Gigli saw.  The bone edges  were smoothed with a rasp.  The wound was irrigated with saline.  Hemostasis  was  with electrocautery.  The posterior fascia was sewn to the anterior  fascia with interrupted 0 figure-of-eight Vicryl sutures.  Staples were used  for closure of the skin.  A sterile dressing was applied.  The patient was  taken to the recovery room in stable condition.                                               Larina Earthly, M.D.    TFE/MEDQ  D:  09/21/2002  T:  09/22/2002  Job:  [id number]

## 2010-07-31 NOTE — Consult Note (Signed)
NAME:  [name], [name]                            ACCOUNT NO.:  [id number]   MEDICAL RECORD NO.:  [id number]                   PATIENT TYPE:  INP   LOCATION:  A340                                 FACILITY:  [facility]   PHYSICIAN:  R. Roetta Sessions, M.D.              DATE OF BIRTH:  1933-04-29   DATE OF CONSULTATION:  05/17/2002  DATE OF DISCHARGE:                                   CONSULTATION   REASON FOR CONSULTATION:  Nausea, vomiting, hematemesis.   HISTORY OF PRESENT ILLNESS:  The patient is a pleasant 77 year old lady with  longstanding type 1 diabetes mellitus, admitted to the hospital today with a  36-hour history of nausea and vomiting.  She tells me she was in her usual  state of fairly good health with no GI symptoms until the night before last.  After she ate two apples, she started getting swimmy-headed and became  nauseated and started repeatedly vomiting.  She said she did bring up some  small amount of blood.  She has not had any melena or rectal bleeding.  Otherwise no change in bowel habits including constipation or diarrhea.  Nausea and vomiting persisted.  She has not had any abdominal pain.  She  presented today for further evaluation.  She was subsequently admitted to  the hospital.   Initial white count from early this morning 16,100, at 0630 this morning is  18,000.  H&H is down from 16.8 to 15.4.  Potassium also down from 3.0 to  2.8.  Blood glucose markedly elevated at 554 on 05/17/02.  Liver profile was  normal except for a total bilirubin of 2.1, direct component 0.3, indirect  1.8.  The patient tells me she has not had any prior of nausea or vomiting,  early satiety, reflux symptoms, odynophagia, or dysphagia.  There is no  history of peptic ulcer disease.  She does tell me that she underwent  colonoscopy and EGD by Dr. Lovell Sheehan a few years ago for reasons she does not  recall (and those records are not available).  She is status post  cholecystectomy for what  she describes as biliary dyskinesia.   She has not really tried to eat anything because of repeated nausea and  vomiting over the past 24 hour.  Lying still tends to ameliorate her  symptoms.  Moving around tends to worsen them.  She denies being dizzy.  She  has not had a fever, and no one else around her has been ill.   PAST MEDICAL HISTORY:  Significant for longstanding type 1 diabetes  mellitus, hypertension, left AKA in 2001, varicose vein surgery, status post  hysterectomy and cholecystectomy.   MEDICATIONS ON ADMISSION:  Accupril, aspirin, NPH insulin 20 units orally  b.i.d.   She has been started on IV Protonix, and her potassium is being  supplemented.   ALLERGIES:  PENICILLIN.   FAMILY HISTORY:  Negative  for chronic GI or liver disease.   SOCIAL HISTORY:  The patient is married.  She lives in [place].  Does  not use tobacco or alcohol.   REVIEW OF SYSTEMS:  No recent fever, chills, or change in weight.  No chest  pain or dyspnea on exertion.   PHYSICAL EXAMINATION:  GENERAL:  A pleasant, elderly lady who appears  uncomfortable.  VITAL SIGNS:  Temperature 98, blood pressure 172/65, heart rate 102,  respiratory rate 16.  SKIN:  Warm and dry.  HEENT:  No scleral icterus.  Conjunctivae are pink.  Oral cavity:  No  lesions.  NECK:  JVD is not prominent.  CHEST:  Lungs are clear to auscultation.  CARDIAC:  Regular rate and rhythm without murmur, gallop, or rub.  ABDOMEN:  Nondistended, positive bowel sounds, no succussion splash.  Abdomen is soft and nontender without appreciable mass or organomegaly.  EXTREMITIES:  Status post left AKA.  No edema.   ADDITIONAL LABORATORY DATA:  Sodium 137, potassium 3.0, chloride 93, CO2 32,  glucose 554, BUN 21, creatinine 1.3, calcium 9.2.  Total protein 7.8,  albumin 4.0, AST 23, ALT 14, ALP 110, total bilirubin 2.1, direct bilirubin  0.3.   IMPRESSION:  The patient is a pleasant 77 year old lady admitted to the  hospital  with a 36-hour history of nausea and vomiting.  This is in the  setting of a markedly elevated blood sugar.   She does have a leukocytosis.  She has an entirely benign abdominal  examination.  She has not had any diarrhea.   I suspect that she has recently acquired a viral or perhaps a food-borne  illness.   Although she does not have any chronic symptoms, she is at risk for  gastroparesis in the setting of a markedly elevated blood sugar.  This acute  illness may have unmasked gastroparesis in this setting.   Would like to review Dr. Lovell Sheehan' prior EGD and colonoscopy notes.   This lady reports she has had some hematemesis.  She has had a drop in her  hemoglobin, but it remains well in the normal range.  I am glad to see that  she is on acid-suppression therapy empirically.   RECOMMENDATIONS:  1. Continue symptomatic treatment of this nice lady.  2. I have offered this lady an EGD to further evaluate her nausea, vomiting,     and hematemesis.  Discussed the potential risks, benefits, and she is     agreeable.    PLAN:  Will perform an EGD on 05/18/02 and will make further recommendations  at that time.   I would like to thank Dr. Sudie Bailey for letting me see this nice lady today.                                                  Jonathon Bellows, M.D.    RMR/MEDQ  D:  05/17/2002  T:  05/17/2002  Job:  [id number]   cc:   Mila [facility]. Sudie Bailey, M.D.  [address] [place], [place] [address]  Fax: [phone]

## 2010-07-31 NOTE — Discharge Summary (Signed)
NAME:  [name], [name]                            ACCOUNT NO.:  [id number]   MEDICAL RECORD NO.:  [id number]                   PATIENT TYPE:  INP   LOCATION:  A340                                 FACILITY:  [facility]   PHYSICIAN:  Mila [facility]. Sudie Bailey, M.D.           DATE OF BIRTH:  04/25/1933   DATE OF ADMISSION:  05/17/2002  DATE OF DISCHARGE:                                 DISCHARGE SUMMARY   HISTORY OF PRESENT ILLNESS:  This 77 year old presented to the hospital with  nausea, vomiting, and a glucose of over 500.   HOSPITAL COURSE:  She had a benign 3-day hospitalization.  Vital signs  remained stable, except her blood pressure stayed somewhat up in the  hospital.   Her admission white cell count was 16,100; rechecked 18,000 with 95%  neutrophils, 3 lymphs, and 2 monos. Her admission potassium was 3.0,  chloride 93, glucose 554, BUN 21, creatinine 1.3, recheck potassium2.8 that  day.  Her recheck potassium her second day was 3.9. Glucose was down to 252  on this occasion.  BUN 17 and creatinine 0.8.  Rechecked white cell count  was still somewhat elevated at 17,800 with 86% neutrophils and 10 lymphs.  H&H was stable at 14.7 and 41.9.   Acute abdominal series showed normal stool and bowel gas pattern and a  prominent amount of stool present in the rectosigmoid.  There were surgical  clips in the region of the gallbladder fossa, and mild degenerative changes  in the lower lumbar spine and the right SI joint.   Her second day she had an EGD through Dr. Jena Gauss, gastroenterologist.  This  showed severe esophageal erosions involving the distal half of the esophagus  and erosions in the gastric body.   TREATMENT IN THE HOSPITAL:  Included normal saline, IV 150 mL an hour with  40 mEq KCl per liter.  She was on Accu-Checks q.6h.  Treated with regular  insulin and given p.r.n. Phenergan, Protonix 4 mg IV daily and sliding scale  regular insulin. She had Accupril 10 mg p.o. b.i.d.   She did  well on this regimen.  She is much better her second day, felt much  better.  Vitals stayed stable.  By the third day she was ready for discharge  home.   FINAL DISCHARGE DIAGNOSES:  1. Probably viral gastritis, although cannot rule out acute Helicobacter     pylori.  2. Insulin-dependent diabetes mellitus.  3. Hypokalemia.  4. Esophageal erosions.  5. Possible early diabetic gastroparesis.  6. Essential hypertension.    DISCHARGE INSTRUCTIONS:  The patient will be discharged home on Protonix 4  mg daily, continue her Accupril 20 mg daily and her insulin at home.  Will  monitor her BPs closely at home since her systolics stayed up in the  hospital.  Follow up, within the week for BP monitoring.  Mila [facility]. Sudie Bailey, M.D.    SDK/MEDQ  D:  05/19/2002  T:  05/20/2002  Job:  [id number]

## 2010-07-31 NOTE — Group Therapy Note (Signed)
NAME:  [name], [name]                            ACCOUNT NO.:  [id number]   MEDICAL RECORD NO.:  [id number]                   PATIENT TYPE:  INP   LOCATION:  A312                                 FACILITY:  [facility]   PHYSICIAN:  Mila [facility]. Sudie Bailey, M.D.           DATE OF BIRTH:  Jul 23, 1933   DATE OF PROCEDURE:  06/13/2002  DATE OF DISCHARGE:                                   PROGRESS NOTE   SUBJECTIVE:  She says she is feeling stronger today.  She tells me she was  actually up in a chair today.  On the other hand, she still has weakness of  her right arm.   She is on atenolol 50 mg daily, aspirin 325 mg daily, Plavix 75 daily,  Lotensin 20 mg daily, folic acid 1 mg daily, hydrochlorothiazide 12.5 mg  daily, Reglan 5 mg a.c. and h.s., Protonix 40 mg daily, KCl 20 mEq b.i.d.,  Zocor 40 mg daily, multivitamins daily, insulin 70/30 26 in the a.m. and 26  p.m.   She is not much of a historian and really does not respond to too many  questions tonight.   OBJECTIVE:  Temperature is 100 degrees, pulse 62, respiratory rate 20, blood  pressure 129/63.  Glucoses in the last 24 hours have been 127, 139, 67, and  202.  In talking to her she appears fairly alert and is able to answer  questions with good sentence structure and no slurring of speech, looks  tired.  She is supine in bed, no acute distress except for right arm pain.  She is well-developed, somewhat obese - particularly truncally.  Heart has a  regular rhythm, rate of about 70.  Lungs are clear throughout.  There is  edema of the entire right arm extending from the shoulder down to the hand.  She really cannot raise it more than an inch or two above the mattress  without having pain.  The abdomen is soft and obese without  hepatosplenomegaly or mass.  There is no edema of the right distal leg and  she has a left BKA.  There is brawny discoloration of the right distal leg.   ASSESSMENT:  1. Deep vein thrombosis of the right arm.  She  is off IV fluids in this arm     for several days now.  If this were just IV infiltration this should have     cleared up by now.  2. Type 1 diabetes, very well controlled in the hospital on the correct dose     of insulin.  3. Escherichia coli urinary tract infection.  4. Peripheral vascular disease status post left below-knee amputation.   PLAN:  I have discussed her case over the phone with Dr. Juan Quam,  radiologist.  Will arrange for ultrasound of the right upper extremity  tomorrow.  Meanwhile, continue other medications for diabetes, hypertension,  and her E. coli  UTI.   I spent 25 minutes hospital time reviewing her medication, nurse's notes,  and vital signs in the hospital computer; talking to her, getting a history,  examining her; and reviewing chart notes and discussing with Dr. Pia Mau over  the phone.                                               Mila Homer. Sudie Bailey, M.D.   SDK/MEDQ  D:  06/13/2002  T:  06/14/2002  Job:  [id number]

## 2010-07-31 NOTE — Group Therapy Note (Signed)
NAME:  [name], [name]                            ACCOUNT NO.:  [id number]   MEDICAL RECORD NO.:  [id number]                   PATIENT TYPE:  INP   LOCATION:  A312                                 FACILITY:  [facility]   PHYSICIAN:  Mila Homer. Sudie Bailey, M.D.           DATE OF BIRTH:  [birth year]   DATE OF PROCEDURE:  06/21/2002  DATE OF DISCHARGE:                                   PROGRESS NOTE   SUBJECTIVE:  She is feeling much better today.  She is being more alert.  She actually was talking and laughing with family, walked in the room with  physical therapy, ate her lunch.   She continues on numerous medications, including atenolol 50 mg daily,  benazepril 20 mg daily, hydrochlorothiazide 12.5 mg daily, and KCl 20 mEq  b.i.d. for hypertension, Simvastatin 40 mg daily for hypercholesterolemia,  aspirin 325 mg daily with Lovenox 70 mg subcu daily for stroke prevention,  Protonix 40 mg daily for prevention of stress ulcers, multivitamins daily,  and folic acid 1 mg daily.  She is also on Humulin N 26 units in the morning  and 20 units at 6 p.m.   She received one dose of Lasix 80 mg a day ago.  She also started on  theophylline 300 mg t.i.d. yesterday.   OBJECTIVE:  Temperature 97.8, pulse 62, respiratory rate 20, blood pressure  138/49.  Sugars in the last 24 hours have been 129, 145, 241, 187.  She,  this morning, is in no acute distress.  Well-developed, well-nourished,  oriented and alert.  She acts much less sleepy than she has been last week.  Her eyes actually open.  She looks like she has good energy level, sitting  up in a chair with legs elevated.  Her heart has a regular rhythm without  murmur, rate of 70.  Lungs clear throughout, moving air well.  Abdomen soft  without hepatosplenomegaly or mass.  Mucous membranes are moist.  Skin  turgor normal.  There is no edema of the right leg.  The left leg is  surgically absent.   ASSESSMENT:  1. Confusion, probably secondary to  posterior circulation strokes, much     improved; possibly due to theophylline.  2. Type 1 diabetes with sugars well controlled for her.  3. Essential hypertension.  4. Escherichia coli urinary tract infection, now stable off antibiotics.   PLAN:  Will decrease her KCl from 20 mEq b.i.d. to 20 mEq daily.  Discussed  her two occasions Dr. Carylon Perches who is covering and given her improvement  she may be able to go home in one to two days.  I would continue her current  insulin at home and would add theophylline 300 mg b.i.d.  At the time of  discharge would also discontinue the Lovenox, switch back to Plavix 75 mg  daily.   Note, I spent 25 minutes with this woman today  in reviewing her electronic  record including vital signs, medications, and nurses' notes; looking over  her paper record; examining her and talking to the patient and husband in  the room; discussing with Dr. Ouida Sills over the phone; and formulating a plan  and dictating the same.                                               Mila Homer. Sudie Bailey, M.D.   SDK/MEDQ  D:  06/21/2002  T:  06/21/2002  Job:  [id number]

## 2010-10-15 ENCOUNTER — Encounter: Payer: Self-pay | Admitting: Gastroenterology

## 2010-10-27 ENCOUNTER — Other Ambulatory Visit: Payer: Self-pay

## 2010-10-28 ENCOUNTER — Other Ambulatory Visit: Payer: Self-pay

## 2010-10-29 NOTE — Telephone Encounter (Signed)
Please schedule pt appt.  

## 2010-10-29 NOTE — Telephone Encounter (Signed)
Called patient and told her sitter that she needs an office visit before we could refill her Omeprazole. Her sitter said they would call back because her daughter is in the hospital and she would have to be the one who brings her.

## 2010-10-30 NOTE — Telephone Encounter (Signed)
Pt has OV on 9/20/ @ 2pm with SF

## 2010-12-03 ENCOUNTER — Ambulatory Visit: Payer: Self-pay | Admitting: Gastroenterology

## 2011-01-21 ENCOUNTER — Ambulatory Visit: Payer: Self-pay | Admitting: Gastroenterology

## 2011-03-15 ENCOUNTER — Encounter: Payer: Self-pay | Admitting: Gastroenterology

## 2011-03-18 ENCOUNTER — Telehealth: Payer: Self-pay

## 2011-03-18 ENCOUNTER — Encounter: Payer: Self-pay | Admitting: Gastroenterology

## 2011-03-18 ENCOUNTER — Ambulatory Visit (INDEPENDENT_AMBULATORY_CARE_PROVIDER_SITE_OTHER): Payer: Medicare Other | Admitting: Gastroenterology

## 2011-03-18 DIAGNOSIS — K3184 Gastroparesis: Secondary | ICD-10-CM

## 2011-03-18 DIAGNOSIS — K219 Gastro-esophageal reflux disease without esophagitis: Secondary | ICD-10-CM | POA: Insufficient documentation

## 2011-03-18 MED ORDER — METOCLOPRAMIDE HCL 5 MG PO TABS: 5.0000 mg | ORAL_TABLET | Freq: Four times a day (QID) | ORAL | Status: DC

## 2011-03-18 MED ORDER — OMEPRAZOLE 20 MG PO CPDR: 20.0000 mg | DELAYED_RELEASE_CAPSULE | Freq: Every day | ORAL | Status: DC

## 2011-03-18 NOTE — Progress Notes (Signed)
Reminder in epic to follow up in one year °

## 2011-03-18 NOTE — Progress Notes (Signed)
Cc to PCP 

## 2011-03-18 NOTE — Progress Notes (Signed)
  Subjective:    Patient ID: [name], female    DOB: 16-Nov-1933, 78 y.o.   MRN: [id number]  PCP: Louisa Second  HPI LARGE BMs SMELLY 2-3 TIMES A DAY: SOLID. nO DIARRHEA. SMELLY BELCHING: DAILY, several times a day. Appetite: excellent. Nausea: no. No vomiting or problems swallowing. Yesterday had pinto beans for lunch. Today hamburger patty and fries. Eats fruit every day: from fruit cups, & a lot if fresh fruit during Christmas. Had right cataract but no surgery planned. No additional vision changes, breast drainage, tardive dyskinesia, or tremors. Last hospital stay: OCT 2011. USING mlx PRN.  Past Medical History  Diagnosis Date  . HTN (hypertension)   . Stroke     Past Surgical History  Procedure Date  . Below knee leg amputation     left  . 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 Lump removed     from left breast  . S/p hysterectomy   . Cholecystectomy   . Esophagogastroduodenoscopy 09/23/08    no barrett's ring ,mass or stricture    Allergies  Allergen Reactions  . Penicillins     REACTION: unknown reaction    Current Outpatient Prescriptions  Medication Sig Dispense Refill  . aspirin 81 MG tablet Take 81 mg by mouth daily.        . folic acid (FOLVITE) 1 MG tablet Take 1 mg by mouth daily.        Marland Kitchen lisinopril (PRINIVIL,ZESTRIL) 20 MG tablet Take 20 mg by mouth daily.      . metoCLOPramide (REGLAN) 5 MG tablet  1 PO QAC AND HS    . Multiple Vitamin (MULTIVITAMIN) capsule Take 1 capsule by mouth daily.      Marland Kitchen omeprazole (PRILOSEC) 20 MG capsule Take 1 capsule (20 mg total) by mouth daily. 1 PO EVERY MORNING    . pioglitazone (ACTOS) 30 MG tablet Take 30 mg by mouth daily.        . potassium chloride SA (K-DUR,KLOR-CON) 20 MEQ tablet Take 20 mEq by mouth 2 (two) times daily.        . vitamin C (ASCORBIC ACID) 250 MG tablet Take 250 mg by mouth daily.            Review of Systems     Objective:   Physical Exam  Vitals  reviewed. Constitutional: No distress.  HENT:  Head: Normocephalic and atraumatic.  Cardiovascular: Normal rate, regular rhythm and normal heart sounds.   Pulmonary/Chest: Effort normal and breath sounds normal.  Abdominal: Soft. Bowel sounds are normal. She exhibits no distension. There is no tenderness.       LIMITED EXAM PT IN [facility]  Neurological: She is alert.       NO FOCAL DEFICITS           Assessment & Plan:

## 2011-03-18 NOTE — Telephone Encounter (Signed)
T/C from [place] at [facility]. She said pt had just had a prescription on 02/22/2011 for Reglan 10mg  and same directions one before meals and at bedtime from Dr. Felecia Shelling. Which does she need, 10 mg tablets or 5 mg tablets. Please advise!

## 2011-03-18 NOTE — Assessment & Plan Note (Signed)
Sx controlled. No Reglan side effects.  OPV in 1 year. Refill meds X1 year.

## 2011-03-18 NOTE — Patient Instructions (Signed)
Continue Reglan and omeprazole. Use Colace and Miralax as needed. Follow up in 1 year. Call for an appointment if have any other questions. Go to ED if you are unable to keep down food and meds for 24 hours.

## 2011-03-18 NOTE — Assessment & Plan Note (Signed)
Sx controlled.  Refill OMP x1 year. OPV in 1 year.

## 2011-03-19 NOTE — Telephone Encounter (Signed)
Informed Olegario Messier at [facility].

## 2011-03-19 NOTE — Telephone Encounter (Signed)
PT NEEDS 10 MG QAC AND HS

## 2011-03-25 ENCOUNTER — Other Ambulatory Visit: Payer: Self-pay

## 2011-03-25 ENCOUNTER — Telehealth: Payer: Self-pay

## 2011-03-25 DIAGNOSIS — R197 Diarrhea, unspecified: Secondary | ICD-10-CM

## 2011-03-25 NOTE — Telephone Encounter (Signed)
PT NEEDS TO BE SEEN BY GI Or DR. FAGAN. She likely has a viral illness. We can obtain stool studies to evaluate for a bacterial infection: routine stool Cx, CDIFF PCR. She may use Kaopectate prn for diarrhea. Drink plenty of fluids to keep urine light yellow.

## 2011-03-25 NOTE — Telephone Encounter (Signed)
Pt's daughter, Elita Quick,  called and said that she has had watery diarrheax 2 days, approximately 8-9x day. She has not changed any eating habits or anything. Please advise! ( Uses [facility] pharmacy)  Pam can be reached at [phone].

## 2011-03-25 NOTE — Telephone Encounter (Signed)
Informed pt's daughter, Elita Quick. She said to fax the orders to [facility] for the stool studies and she will pick up containers over there and do sometime Sat if she can.

## 2012-02-23 ENCOUNTER — Encounter: Payer: Self-pay | Admitting: *Deleted

## 2012-04-18 ENCOUNTER — Other Ambulatory Visit: Payer: Self-pay

## 2012-04-18 DIAGNOSIS — K219 Gastro-esophageal reflux disease without esophagitis: Secondary | ICD-10-CM

## 2012-04-18 MED ORDER — OMEPRAZOLE 20 MG PO CPDR: 20.0000 mg | DELAYED_RELEASE_CAPSULE | Freq: Every day | ORAL | Status: DC

## 2012-04-18 NOTE — Telephone Encounter (Signed)
Needs OV 1 yr FU  I gave her 1 month RF until she can get in for OV Thanks

## 2012-04-18 NOTE — Telephone Encounter (Signed)
Tried to call pt, phone is not in service. Will make an appointment and send her a card in the mail.

## 2012-04-18 NOTE — Telephone Encounter (Signed)
[name] can we get her an appointment. Thanks

## 2012-05-17 ENCOUNTER — Encounter: Payer: Self-pay | Admitting: Gastroenterology

## 2012-05-18 ENCOUNTER — Telehealth: Payer: Self-pay | Admitting: Gastroenterology

## 2012-05-18 ENCOUNTER — Ambulatory Visit (INDEPENDENT_AMBULATORY_CARE_PROVIDER_SITE_OTHER): Payer: Medicare Other | Admitting: Gastroenterology

## 2012-05-18 DIAGNOSIS — E889 Metabolic disorder, unspecified: Secondary | ICD-10-CM

## 2012-05-18 NOTE — Telephone Encounter (Signed)
REVIEWED.  

## 2012-05-18 NOTE — Telephone Encounter (Signed)
Pt was a no show

## 2012-05-18 NOTE — Progress Notes (Deleted)
  Subjective:    Patient ID: [name], female    DOB: 1933-10-24, 79 y.o.   MRN: [id number]  PCP  HPI    Review of Systems     Objective:   Physical Exam        Assessment & Plan:

## 2012-05-19 NOTE — Progress Notes (Deleted)
error 

## 2012-05-19 NOTE — Progress Notes (Signed)
Error

## 2012-05-26 ENCOUNTER — Other Ambulatory Visit: Payer: Self-pay

## 2012-05-26 DIAGNOSIS — K219 Gastro-esophageal reflux disease without esophagitis: Secondary | ICD-10-CM

## 2012-05-26 MED ORDER — OMEPRAZOLE 20 MG PO CPDR: 20.0000 mg | DELAYED_RELEASE_CAPSULE | Freq: Every day | ORAL | Status: DC

## 2012-05-26 NOTE — Telephone Encounter (Signed)
Please arrange 1 yr FU GERD/diarrhea

## 2012-05-27 ENCOUNTER — Emergency Department (HOSPITAL_COMMUNITY)
Admission: EM | Admit: 2012-05-27 | Discharge: 2012-05-28 | Disposition: A | Discharge status: Home / Self Care | Payer: Medicare Other | Attending: Emergency Medicine | Admitting: Emergency Medicine

## 2012-05-27 ENCOUNTER — Encounter (HOSPITAL_COMMUNITY): Payer: Self-pay | Admitting: *Deleted

## 2012-05-27 ENCOUNTER — Emergency Department (HOSPITAL_COMMUNITY): Payer: Medicare Other

## 2012-05-27 DIAGNOSIS — R81 Glycosuria: Secondary | ICD-10-CM | POA: Insufficient documentation

## 2012-05-27 DIAGNOSIS — Z8673 Personal history of transient ischemic attack (TIA), and cerebral infarction without residual deficits: Secondary | ICD-10-CM | POA: Insufficient documentation

## 2012-05-27 DIAGNOSIS — S88119A Complete traumatic amputation at level between knee and ankle, unspecified lower leg, initial encounter: Secondary | ICD-10-CM | POA: Insufficient documentation

## 2012-05-27 DIAGNOSIS — R739 Hyperglycemia, unspecified: Secondary | ICD-10-CM

## 2012-05-27 DIAGNOSIS — Z794 Long term (current) use of insulin: Secondary | ICD-10-CM | POA: Insufficient documentation

## 2012-05-27 DIAGNOSIS — E1169 Type 2 diabetes mellitus with other specified complication: Secondary | ICD-10-CM | POA: Insufficient documentation

## 2012-05-27 DIAGNOSIS — Z79899 Other long term (current) drug therapy: Secondary | ICD-10-CM | POA: Insufficient documentation

## 2012-05-27 DIAGNOSIS — I10 Essential (primary) hypertension: Secondary | ICD-10-CM | POA: Insufficient documentation

## 2012-05-27 DIAGNOSIS — Z7982 Long term (current) use of aspirin: Secondary | ICD-10-CM | POA: Insufficient documentation

## 2012-05-27 LAB — CBC WITH DIFFERENTIAL/PLATELET
Basophils Absolute: 0 10*3/uL (ref 0.0–0.1)
Basophils Relative: 0 % (ref 0–1)
Eosinophils Absolute: 0 10*3/uL (ref 0.0–0.7)
Eosinophils Relative: 0 % (ref 0–5)
Lymphocytes Relative: 12 % (ref 12–46)
MCH: 28.6 pg (ref 26.0–34.0)
MCV: 83.8 fL (ref 78.0–100.0)
Platelets: 269 10*3/uL (ref 150–400)
RDW: 12.8 % (ref 11.5–15.5)
WBC: 11.9 10*3/uL — ABNORMAL HIGH (ref 4.0–10.5)

## 2012-05-27 LAB — LIPASE, BLOOD: Lipase: 21 U/L (ref 11–59)

## 2012-05-27 LAB — URINE MICROSCOPIC-ADD ON

## 2012-05-27 LAB — BLOOD GAS, VENOUS
Bicarbonate: 30.4 mEq/L — ABNORMAL HIGH (ref 20.0–24.0)
TCO2: 27 mmol/L (ref 0–100)
pCO2, Ven: 45.8 mmHg (ref 45.0–50.0)
pH, Ven: 7.437 — ABNORMAL HIGH (ref 7.250–7.300)

## 2012-05-27 LAB — URINALYSIS, ROUTINE W REFLEX MICROSCOPIC
Bilirubin Urine: NEGATIVE
Nitrite: NEGATIVE
Specific Gravity, Urine: 1.01 (ref 1.005–1.030)
pH: 7 (ref 5.0–8.0)

## 2012-05-27 LAB — COMPREHENSIVE METABOLIC PANEL
ALT: <5 U/L (ref 0–35)
AST: 13 U/L (ref 0–37)
Calcium: 9.6 mg/dL (ref 8.4–10.5)
Sodium: 137 mEq/L (ref 135–145)
Total Protein: 6.8 g/dL (ref 6.0–8.3)

## 2012-05-27 MED ORDER — INSULIN REGULAR HUMAN 100 UNIT/ML IJ SOLN
10.0000 [IU] | Freq: Once | INTRAMUSCULAR | Status: AC
  Administered 2012-05-27: 10 [IU] via SUBCUTANEOUS
  Filled 2012-05-27: qty 0.1

## 2012-05-27 MED ORDER — SODIUM CHLORIDE 0.9 % IV BOLUS (SEPSIS)
1000.0000 mL | Freq: Once | INTRAVENOUS | Status: AC
  Administered 2012-05-27: 1000 mL via INTRAVENOUS

## 2012-05-27 NOTE — ED Notes (Signed)
CBG 419 by EMS. EMS call was for "not feeling well"

## 2012-05-27 NOTE — ED Provider Notes (Signed)
History     CSN: [id number]  Arrival date & time 05/27/12  1725   First MD Initiated Contact with Patient 05/27/12 1841      Chief Complaint  Patient presents with  . Hyperglycemia     HPI The patient presents with her daughter who provides much of the history of present illness.  The patient has history of insulin-dependent diabetes.  The patient's daughter provides history of recent atypical behavior.  The patient has had multiple episodes of similar behavior in the past, though none recently.  Over the past days the patient has exhibited strange behavior, and particularly forgetful.  No reports of new vomiting, diarrhea, cough, fever.  The patient herself denies complaints.  She states that she is compliant with medications, including insulin. For this period of atypical behavior there was no clear precipitant.  And there have been no clear alleviating or exacerbating factors.   Past Medical History  Diagnosis Date  . HTN (hypertension)   . Stroke     Past Surgical History  Procedure Laterality Date  . Below knee leg amputation      left  . 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 Lump removed      from left breast  . S/p hysterectomy    . Cholecystectomy    . Esophagogastroduodenoscopy  09/23/08    SLF:no barrett's ring ,mass or stricture    No family history on file.  History  Substance Use Topics  . Smoking status: Never Smoker   . Smokeless tobacco: Not on file  . Alcohol Use: No    OB History   Grav Para Term Preterm Abortions TAB SAB Ect Mult Living                  Review of Systems  Constitutional:       Per HPI, otherwise negative  HENT:       Per HPI, otherwise negative  Eyes: Negative for visual disturbance.  Respiratory:       Per HPI, otherwise negative  Cardiovascular:       Per HPI, otherwise negative  Gastrointestinal: Negative for vomiting.  Endocrine:       Negative aside from HPI  Genitourinary:   Neg aside from HPI   Musculoskeletal:       Per HPI, otherwise negative  Skin: Negative.   Neurological: Negative for syncope.    Allergies  Penicillins  Home Medications   Current Outpatient Rx  Name  Route  Sig  Dispense  Refill  . aspirin EC 81 MG tablet   Oral   Take 81 mg by mouth daily.         Marland Kitchen lisinopril-hydrochlorothiazide (PRINZIDE,ZESTORETIC) 20-25 MG per tablet   Oral   Take 1 tablet by mouth 2 (two) times daily.         Marland Kitchen lovastatin (MEVACOR) 20 MG tablet   Oral   Take 20 mg by mouth at bedtime.         . metoCLOPramide (REGLAN) 5 MG tablet   Oral   Take 5 mg by mouth 4 (four) times daily.         Marland Kitchen omeprazole (PRILOSEC) 20 MG capsule   Oral   Take 20 mg by mouth daily.         . potassium chloride SA (K-DUR,KLOR-CON) 20 MEQ tablet   Oral   Take 20 mEq by mouth 2 (two) times daily.           Marland Kitchen  sitaGLIPtin (JANUVIA) 50 MG tablet   Oral   Take 50 mg by mouth daily.           BP 135/91  Temp(Src) 99.3 F (37.4 C) (Oral)  Resp 16  SpO2 99%  Physical Exam  Nursing note and vitals reviewed. Constitutional: She is oriented to person, place, and time. She appears well-developed and well-nourished. No distress.  HENT:  Head: Normocephalic and atraumatic.  Eyes: Conjunctivae and EOM are normal. Right eye exhibits no discharge. Left eye exhibits no discharge.  Neck: No tracheal deviation present.  Cardiovascular: Normal rate and regular rhythm.   Pulmonary/Chest: Effort normal and breath sounds normal. No stridor. No respiratory distress.  Abdominal: She exhibits no distension.  Musculoskeletal: She exhibits no edema.  Bilateral lower extremity below-the-knee amputations  Neurological: She is alert and oriented to person, place, and time. No cranial nerve deficit. She exhibits normal muscle tone. Coordination normal.  Skin: Skin is warm and dry.  Psychiatric: She has a normal mood and affect.    ED Course  Procedures (including  critical care time)  Labs Reviewed  GLUCOSE, CAPILLARY - Abnormal; Notable for the following:    Glucose-Capillary 396 (*)    All other components within normal limits  CBC WITH DIFFERENTIAL - Abnormal; Notable for the following:    WBC 11.9 (*)    Neutrophils Relative 81 (*)    Neutro Abs 9.7 (*)    All other components within normal limits  COMPREHENSIVE METABOLIC PANEL - Abnormal; Notable for the following:    Potassium 3.3 (*)    Chloride 94 (*)    Glucose, Bld 400 (*)    GFR calc non Af Amer 86 (*)    All other components within normal limits  URINALYSIS, ROUTINE W REFLEX MICROSCOPIC - Abnormal; Notable for the following:    APPearance CLOUDY (*)    Glucose, UA >1000 (*)    Hgb urine dipstick LARGE (*)    All other components within normal limits  BLOOD GAS, VENOUS - Abnormal; Notable for the following:    pH, Ven 7.437 (*)    pO2, Ven 51.1 (*)    Bicarbonate 30.4 (*)    Acid-Base Excess 6.2 (*)    All other components within normal limits  URINE MICROSCOPIC-ADD ON - Abnormal; Notable for the following:    Squamous Epithelial / LPF FEW (*)    Bacteria, UA MANY (*)    All other components within normal limits  URINE CULTURE  LIPASE, BLOOD   Ct Head Wo Contrast  05/27/2012  *RADIOLOGY REPORT*  Clinical Data: Diabetes.  Hypertension.  Stroke.  Hyperglycemia.  CT HEAD WITHOUT CONTRAST  Technique:  Contiguous axial images were obtained from the base of the skull through the vertex without contrast.  Comparison: None.  Findings: The brain shows generalized atrophy.  There are chronic appearing small vessel infarctions within the hemispheric white matter.  No sign of acute infarction, mass lesion, hemorrhage, hydrocephalus or extra-axial collection.  Atherosclerotic calcification effects major vessels at the base of the brain. Sinuses, middle ears and mastoids are clear.  No calvarial abnormality.  IMPRESSION: No acute finding.  Atrophy and chronic small vessel disease.   Original  Report Authenticated By: Paulina Fusi, M.D.      No diagnosis found.   On repeat exam the patient appears possible.  She remained afebrile, hemodynamically stable.   9:33 PM The remainder of the patient's labs were discussed with the patient and her daughter.  The patient remained  hemodynamic stable.  Given the evidence of glucosuria, hematuria, hyperglycemia, she received additional fluids, insulin.  The patient has no anion gap, no evidence of acidosis. MDM  This patient with long history of diabetes presents with familial concerns of atypical behavior.  The patient herself denies focal complaints.  With her history there is concern for DKA versus other metabolic or infectious processes.  The patient's labs do not demonstrate likely infection, but there is evidence of hyperglycemia and associated changes, though no acidosis, no decreased bicarbonate.  With her decreased renal function, nephrotic syndrome, she received additional fluids, insulin.  She was discharged in stable condition, though with close followup, in 2 days with her primary care physician.        Gerhard Munch, MD 05/27/12 2134

## 2012-05-29 NOTE — Telephone Encounter (Signed)
Reminder in epic °

## 2012-05-30 LAB — URINE CULTURE: Colony Count: 75000

## 2012-05-31 NOTE — ED Notes (Signed)
+   urine Chart sent to EDP office for review. 

## 2013-04-21 ENCOUNTER — Emergency Department (HOSPITAL_COMMUNITY): Payer: Medicare Other

## 2013-04-21 ENCOUNTER — Encounter (HOSPITAL_COMMUNITY): Payer: Self-pay | Admitting: Emergency Medicine

## 2013-04-21 ENCOUNTER — Inpatient Hospital Stay (HOSPITAL_COMMUNITY)
Admission: EM | Admit: 2013-04-21 | Discharge: 2013-04-24 | DRG: 638 | Disposition: A | Discharge status: Skilled Nursing Facility | Payer: Medicare Other | Attending: Family Medicine | Admitting: Family Medicine

## 2013-04-21 DIAGNOSIS — I2510 Atherosclerotic heart disease of native coronary artery without angina pectoris: Secondary | ICD-10-CM | POA: Diagnosis present

## 2013-04-21 DIAGNOSIS — E1165 Type 2 diabetes mellitus with hyperglycemia: Secondary | ICD-10-CM

## 2013-04-21 DIAGNOSIS — I10 Essential (primary) hypertension: Secondary | ICD-10-CM | POA: Diagnosis present

## 2013-04-21 DIAGNOSIS — E860 Dehydration: Secondary | ICD-10-CM | POA: Diagnosis present

## 2013-04-21 DIAGNOSIS — R1310 Dysphagia, unspecified: Secondary | ICD-10-CM | POA: Diagnosis present

## 2013-04-21 DIAGNOSIS — E871 Hypo-osmolality and hyponatremia: Secondary | ICD-10-CM | POA: Diagnosis present

## 2013-04-21 DIAGNOSIS — R7309 Other abnormal glucose: Secondary | ICD-10-CM

## 2013-04-21 DIAGNOSIS — E1101 Type 2 diabetes mellitus with hyperosmolarity with coma: Secondary | ICD-10-CM

## 2013-04-21 DIAGNOSIS — N179 Acute kidney failure, unspecified: Secondary | ICD-10-CM | POA: Diagnosis present

## 2013-04-21 DIAGNOSIS — IMO0002 Reserved for concepts with insufficient information to code with codable children: Secondary | ICD-10-CM

## 2013-04-21 DIAGNOSIS — I639 Cerebral infarction, unspecified: Secondary | ICD-10-CM | POA: Diagnosis present

## 2013-04-21 DIAGNOSIS — J449 Chronic obstructive pulmonary disease, unspecified: Secondary | ICD-10-CM | POA: Diagnosis present

## 2013-04-21 DIAGNOSIS — J4489 Other specified chronic obstructive pulmonary disease: Secondary | ICD-10-CM | POA: Diagnosis present

## 2013-04-21 DIAGNOSIS — D72829 Elevated white blood cell count, unspecified: Secondary | ICD-10-CM | POA: Diagnosis present

## 2013-04-21 DIAGNOSIS — Z7982 Long term (current) use of aspirin: Secondary | ICD-10-CM

## 2013-04-21 DIAGNOSIS — E785 Hyperlipidemia, unspecified: Secondary | ICD-10-CM | POA: Diagnosis present

## 2013-04-21 DIAGNOSIS — E1100 Type 2 diabetes mellitus with hyperosmolarity without nonketotic hyperglycemic-hyperosmolar coma (NKHHC): Principal | ICD-10-CM | POA: Diagnosis present

## 2013-04-21 DIAGNOSIS — E1159 Type 2 diabetes mellitus with other circulatory complications: Secondary | ICD-10-CM | POA: Diagnosis present

## 2013-04-21 DIAGNOSIS — K219 Gastro-esophageal reflux disease without esophagitis: Secondary | ICD-10-CM | POA: Diagnosis present

## 2013-04-21 DIAGNOSIS — S88119A Complete traumatic amputation at level between knee and ankle, unspecified lower leg, initial encounter: Secondary | ICD-10-CM

## 2013-04-21 DIAGNOSIS — E1151 Type 2 diabetes mellitus with diabetic peripheral angiopathy without gangrene: Secondary | ICD-10-CM

## 2013-04-21 DIAGNOSIS — I739 Peripheral vascular disease, unspecified: Secondary | ICD-10-CM | POA: Diagnosis present

## 2013-04-21 DIAGNOSIS — E870 Hyperosmolality and hypernatremia: Secondary | ICD-10-CM | POA: Diagnosis present

## 2013-04-21 DIAGNOSIS — R739 Hyperglycemia, unspecified: Secondary | ICD-10-CM | POA: Diagnosis present

## 2013-04-21 DIAGNOSIS — I798 Other disorders of arteries, arterioles and capillaries in diseases classified elsewhere: Secondary | ICD-10-CM | POA: Diagnosis present

## 2013-04-21 HISTORY — DX: Type 2 diabetes mellitus without complications: E11.9

## 2013-04-21 LAB — LIPASE, BLOOD: LIPASE: 19 U/L (ref 11–59)

## 2013-04-21 LAB — URINALYSIS, ROUTINE W REFLEX MICROSCOPIC
Bilirubin Urine: NEGATIVE
LEUKOCYTES UA: NEGATIVE
Nitrite: NEGATIVE
PH: 5 (ref 5.0–8.0)
PROTEIN: NEGATIVE mg/dL
Specific Gravity, Urine: 1.02 (ref 1.005–1.030)
Urobilinogen, UA: 0.2 mg/dL (ref 0.0–1.0)

## 2013-04-21 LAB — GLUCOSE, CAPILLARY
GLUCOSE-CAPILLARY: 598 mg/dL — AB (ref 70–99)
GLUCOSE-CAPILLARY: 443 mg/dL — AB (ref 70–99)
GLUCOSE-CAPILLARY: 293 mg/dL — AB (ref 70–99)
Glucose-Capillary: 587 mg/dL (ref 70–99)
Glucose-Capillary: 348 mg/dL — ABNORMAL HIGH (ref 70–99)

## 2013-04-21 LAB — COMPREHENSIVE METABOLIC PANEL
ALT: 7 U/L (ref 0–35)
AST: 13 U/L (ref 0–37)
Albumin: 3.6 g/dL (ref 3.5–5.2)
Alkaline Phosphatase: 99 U/L (ref 39–117)
BILIRUBIN TOTAL: 1.2 mg/dL (ref 0.3–1.2)
BUN: 53 mg/dL — AB (ref 6–23)
CHLORIDE: 99 meq/L (ref 96–112)
CO2: 24 meq/L (ref 19–32)
CREATININE: 1.47 mg/dL — AB (ref 0.50–1.10)
Calcium: 9.6 mg/dL (ref 8.4–10.5)
GFR calc Af Amer: 39 mL/min — ABNORMAL LOW (ref >90)
GFR, EST NON AFRICAN AMERICAN: 33 mL/min — AB (ref >90)
Glucose, Bld: 703 mg/dL (ref 70–99)
Potassium: 5 mEq/L (ref 3.7–5.3)
Sodium: 143 mEq/L (ref 137–147)
Total Protein: 7.7 g/dL (ref 6.0–8.3)

## 2013-04-21 LAB — CBC WITH DIFFERENTIAL/PLATELET
Basophils Absolute: 0 10*3/uL (ref 0.0–0.1)
Basophils Relative: 0 % (ref 0–1)
Eosinophils Absolute: 0 10*3/uL (ref 0.0–0.7)
Eosinophils Relative: 0 % (ref 0–5)
HCT: 41.7 % (ref 36.0–46.0)
Hemoglobin: 13.8 g/dL (ref 12.0–15.0)
LYMPHS ABS: 0.8 10*3/uL (ref 0.7–4.0)
LYMPHS PCT: 5 % — AB (ref 12–46)
MCH: 29.1 pg (ref 26.0–34.0)
MCHC: 33.1 g/dL (ref 30.0–36.0)
MCV: 87.8 fL (ref 78.0–100.0)
Monocytes Absolute: 0.5 10*3/uL (ref 0.1–1.0)
Monocytes Relative: 3 % (ref 3–12)
NEUTROS ABS: 15.4 10*3/uL — AB (ref 1.7–7.7)
NEUTROS PCT: 92 % — AB (ref 43–77)
PLATELETS: 293 10*3/uL (ref 150–400)
RBC: 4.75 MIL/uL (ref 3.87–5.11)
RDW: 13.3 % (ref 11.5–15.5)
WBC: 16.7 10*3/uL — AB (ref 4.0–10.5)

## 2013-04-21 LAB — TROPONIN I: Troponin I: <0.3 ng/mL (ref <0.30)

## 2013-04-21 LAB — PRO B NATRIURETIC PEPTIDE: Pro B Natriuretic peptide (BNP): 584.8 pg/mL — ABNORMAL HIGH (ref 0–450)

## 2013-04-21 LAB — URINE MICROSCOPIC-ADD ON

## 2013-04-21 MED ORDER — SODIUM CHLORIDE 0.9 % IV SOLN
INTRAVENOUS | Status: DC
  Administered 2013-04-21: 23:00:00 via INTRAVENOUS

## 2013-04-21 MED ORDER — SODIUM CHLORIDE 0.9 % IV SOLN
INTRAVENOUS | Status: DC
  Administered 2013-04-21: 23:00:00 via INTRAVENOUS
  Filled 2013-04-21: qty 1

## 2013-04-21 MED ORDER — ENOXAPARIN SODIUM 30 MG/0.3ML ~~LOC~~ SOLN
30.0000 mg | SUBCUTANEOUS | Status: DC
  Administered 2013-04-22: 30 mg via SUBCUTANEOUS
  Filled 2013-04-21: qty 0.3

## 2013-04-21 MED ORDER — SODIUM CHLORIDE 0.9 % IV BOLUS (SEPSIS)
500.0000 mL | Freq: Once | INTRAVENOUS | Status: AC
  Administered 2013-04-21: 20:00:00 via INTRAVENOUS

## 2013-04-21 MED ORDER — PNEUMOCOCCAL VAC POLYVALENT 25 MCG/0.5ML IJ INJ
0.5000 mL | INJECTION | INTRAMUSCULAR | Status: AC
  Administered 2013-04-22: 0.5 mL via INTRAMUSCULAR
  Filled 2013-04-21: qty 0.5

## 2013-04-21 MED ORDER — SODIUM CHLORIDE 0.9 % IV SOLN: 1000.0000 mL | INTRAVENOUS | Status: DC

## 2013-04-21 MED ORDER — DEXTROSE-NACL 5-0.45 % IV SOLN
INTRAVENOUS | Status: DC
  Administered 2013-04-22: via INTRAVENOUS

## 2013-04-21 MED ORDER — INSULIN REGULAR BOLUS VIA INFUSION
0.0000 [IU] | Freq: Three times a day (TID) | INTRAVENOUS | Status: DC
  Filled 2013-04-21: qty 10

## 2013-04-21 MED ORDER — INSULIN REGULAR HUMAN 100 UNIT/ML IJ SOLN
INTRAMUSCULAR | Status: DC
  Administered 2013-04-21: 5.4 [IU]/h via INTRAVENOUS
  Filled 2013-04-21: qty 1

## 2013-04-21 MED ORDER — SODIUM CHLORIDE 0.9 % IV SOLN
1000.0000 mL | Freq: Once | INTRAVENOUS | Status: AC
  Administered 2013-04-21: 1000 mL via INTRAVENOUS

## 2013-04-21 MED ORDER — DEXTROSE-NACL 5-0.45 % IV SOLN: INTRAVENOUS | Status: DC

## 2013-04-21 MED ORDER — SODIUM CHLORIDE 0.9 % IV SOLN: INTRAVENOUS | Status: DC

## 2013-04-21 MED ORDER — DEXTROSE 50 % IV SOLN: 25.0000 mL | INTRAVENOUS | Status: DC | PRN

## 2013-04-21 NOTE — ED Notes (Signed)
Pt comes from home via EMS with c/o dysphagia x1 week. Family reports pt "has been having trouble eating for a week". Family states pt is "at her baseline mentally". Pt denies pain. Pt has hx of stroke. EMS reports pt's blood sugar was greater than 500 in route.

## 2013-04-21 NOTE — ED Provider Notes (Signed)
CSN: [id number]     Arrival date & time 04/21/13  1737 History  This chart was scribed for Shelda Jakes, MD by Dorothey Baseman, ED Scribe. This patient was seen in room APA02/APA02 and the patient's care was started at 5:54 PM.    Chief Complaint  Patient presents with  . Dysphagia   No language interpreter was used.   Level 5 caveat- Altered mental status   HPI Comments: [name] is a 80 y.o. Female with a history of stroke brought in by EMS who presents to the Emergency Department complaining of dysphagia onset about a week ago. Her family told EMS that the patient has been otherwise acting appropriately for her baseline. Patient was noted to have a CBG > 500 en route via EMS, 587 upon arrival to the ED. Patient also has a history of DM and HTN.   Past Medical History  Diagnosis Date  . HTN (hypertension)   . Stroke   . Diabetes mellitus without complication    Past Surgical History  Procedure Laterality Date  . Below knee leg amputation      left  . 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 Lump removed      from left breast  . S/p hysterectomy    . Cholecystectomy    . Esophagogastroduodenoscopy  09/23/08    SLF:no barrett's ring ,mass or stricture   History reviewed. No pertinent family history. History  Substance Use Topics  . Smoking status: Never Smoker   . Smokeless tobacco: Not on file  . Alcohol Use: No   OB History   Grav Para Term Preterm Abortions TAB SAB Ect Mult Living                 Review of Systems  Unable to perform ROS   Allergies  Penicillins  Home Medications   Current Outpatient Rx  Name  Route  Sig  Dispense  Refill  . aspirin EC 81 MG tablet   Oral   Take 81 mg by mouth daily.         Marland Kitchen lisinopril-hydrochlorothiazide (PRINZIDE,ZESTORETIC) 20-25 MG per tablet   Oral   Take 1 tablet by mouth 2 (two) times daily.         Marland Kitchen lovastatin (MEVACOR) 20 MG tablet   Oral   Take 20 mg by mouth at  bedtime.         . metoCLOPramide (REGLAN) 10 MG tablet   Oral   Take 10 mg by mouth 4 (four) times daily.         . metoCLOPramide (REGLAN) 5 MG tablet   Oral   Take 5 mg by mouth 4 (four) times daily.         Marland Kitchen omeprazole (PRILOSEC) 20 MG capsule   Oral   Take 20 mg by mouth daily.         . potassium chloride SA (K-DUR,KLOR-CON) 20 MEQ tablet   Oral   Take 20 mEq by mouth 2 (two) times daily.           . sitaGLIPtin (JANUVIA) 100 MG tablet   Oral   Take 100 mg by mouth daily.          Triage Vitals: BP 119/52  Pulse 85  Temp(Src) 98.2 F (36.8 C) (Oral)  Resp 19  SpO2 96%  Physical Exam  Nursing note and vitals reviewed. Constitutional: She appears well-developed and well-nourished. No  distress.  HENT:  Head: Normocephalic and atraumatic.  Very dry mucus membranes.   Eyes: Conjunctivae and EOM are normal.  Mild swelling to the right eyelid.   Neck: Normal range of motion. Neck supple.  Cardiovascular: Normal rate, regular rhythm and normal heart sounds.   Pulmonary/Chest: Effort normal and breath sounds normal. No respiratory distress.  Abdominal: Soft. Bowel sounds are normal. She exhibits no distension. There is no tenderness.  Musculoskeletal: Normal range of motion.  Bilateral BKAs.   Neurological:  Patient is able to follow verbal commands.   Skin: Skin is warm and dry.  Psychiatric: She has a normal mood and affect. Her behavior is normal.    ED Course  Procedures (including critical care time)  DIAGNOSTIC STUDIES: Oxygen Saturation is 96% on room air, normal by my interpretation.    COORDINATION OF CARE: 5:57 PM- Will order blood labs, UA, a chest x-ray, and a head CT. Will order IV fluids and insulin to manage symptoms.   Medications  dextrose 5 %-0.45 % sodium chloride infusion (not administered)  insulin regular (NOVOLIN R,HUMULIN R) 1 Units/mL in sodium chloride 0.9 % 100 mL infusion (5.4 Units/hr Intravenous New Bag/Given 04/21/13  1949)  0.9 %  sodium chloride infusion (0 mLs Intravenous Stopped 04/21/13 2027)    Followed by  0.9 %  sodium chloride infusion (not administered)  sodium chloride 0.9 % bolus 500 mL ( Intravenous New Bag/Given 04/21/13 2027)   Results for orders placed during the hospital encounter of 04/21/13  GLUCOSE, CAPILLARY      Result Value Range   Glucose-Capillary 587 (*) 70 - 99 mg/dL   Comment 1 Notify RN     Comment 2 Documented in Chart    COMPREHENSIVE METABOLIC PANEL      Result Value Range   Sodium 143  137 - 147 mEq/L   Potassium 5.0  3.7 - 5.3 mEq/L   Chloride 99  96 - 112 mEq/L   CO2 24  19 - 32 mEq/L   Glucose, Bld 703 (*) 70 - 99 mg/dL   BUN 53 (*) 6 - 23 mg/dL   Creatinine, Ser 1.61 (*) 0.50 - 1.10 mg/dL   Calcium 9.6  8.4 - 09.6 mg/dL   Total Protein 7.7  6.0 - 8.3 g/dL   Albumin 3.6  3.5 - 5.2 g/dL   AST 13  0 - 37 U/L   ALT 7  0 - 35 U/L   Alkaline Phosphatase 99  39 - 117 U/L   Total Bilirubin 1.2  0.3 - 1.2 mg/dL   GFR calc non Af Amer 33 (*) >90 mL/min   GFR calc Af Amer 39 (*) >90 mL/min  CBC WITH DIFFERENTIAL      Result Value Range   WBC 16.7 (*) 4.0 - 10.5 K/uL   RBC 4.75  3.87 - 5.11 MIL/uL   Hemoglobin 13.8  12.0 - 15.0 g/dL   HCT 04.5  40.9 - 81.1 %   MCV 87.8  78.0 - 100.0 fL   MCH 29.1  26.0 - 34.0 pg   MCHC 33.1  30.0 - 36.0 g/dL   RDW 91.4  78.2 - 95.6 %   Platelets 293  150 - 400 K/uL   Neutrophils Relative % 92 (*) 43 - 77 %   Neutro Abs 15.4 (*) 1.7 - 7.7 K/uL   Lymphocytes Relative 5 (*) 12 - 46 %   Lymphs Abs 0.8  0.7 - 4.0 K/uL   Monocytes Relative 3  3 - 12 %   Monocytes Absolute 0.5  0.1 - 1.0 K/uL   Eosinophils Relative 0  0 - 5 %   Eosinophils Absolute 0.0  0.0 - 0.7 K/uL   Basophils Relative 0  0 - 1 %   Basophils Absolute 0.0  0.0 - 0.1 K/uL  LIPASE, BLOOD      Result Value Range   Lipase 19  11 - 59 U/L  PRO B NATRIURETIC PEPTIDE      Result Value Range   Pro B Natriuretic peptide (BNP) 584.8 (*) 0 - 450 pg/mL  GLUCOSE,  CAPILLARY      Result Value Range   Glucose-Capillary 598 (*) 70 - 99 mg/dL  TROPONIN I      Result Value Range   Troponin I <0.30  <0.30 ng/mL   Dg Chest 1 View  04/21/2013   CLINICAL DATA:  Dysphagia  EXAM: CHEST - 1 VIEW  COMPARISON:  December 20, 2009  FINDINGS: Lungs are clear. Heart is upper normal in size with normal pulmonary vascularity. No adenopathy. No bone lesions. There is atherosclerotic change in the aorta.  IMPRESSION: No edema or consolidation.   Electronically Signed   By: Bretta Bang M.D.   On: 04/21/2013 19:19   Ct Head Wo Contrast  04/21/2013   CLINICAL DATA:  Dysphagia.  Altered mental status.  EXAM: CT HEAD WITHOUT CONTRAST  TECHNIQUE: Contiguous axial images were obtained from the base of the skull through the vertex without intravenous contrast.  COMPARISON:  05/27/2012.  FINDINGS: Stable enlarged ventricles and subarachnoid spaces. Minimal patchy white matter low density is again demonstrated in both cerebral hemispheres. No intracranial hemorrhage, mass lesion or CT evidence of acute infarction. Unremarkable bones and included paranasal sinuses.  IMPRESSION: No acute abnormality. Stable atrophy and minimal chronic small vessel white matter ischemic changes.   Electronically Signed   By: Gordan Payment M.D.   On: 04/21/2013 19:26        EKG Interpretation    Date/Time:  Saturday April 21 2013 17:47:48 EST Ventricular Rate:  86 PR Interval:  130 QRS Duration: 124 QT Interval:  430 QTC Calculation: 514 R Axis:   -75 Text Interpretation:  Normal sinus rhythm Right bundle branch block Left anterior fascicular block , Bifasicular  block  Abnormal ECG When compared with ECG of 20-Dec-2009 10:44, Left anterior fascicular block is now Present Confirmed by Dutchess Crosland  MD, Amos Micheals ([id number]) on 04/21/2013 5:53:03 PM           CRITICAL CARE Performed by: Shelda Jakes. Total critical care time: 30 Critical care time was exclusive of separately billable procedures  and treating other patients. Critical care was necessary to treat or prevent imminent or life-threatening deterioration. Critical care was time spent personally by me on the following activities: development of treatment plan with patient and/or surrogate as well as nursing, discussions with consultants, evaluation of patient's response to treatment, examination of patient, obtaining history from patient or surrogate, ordering and performing treatments and interventions, ordering and review of laboratory studies, ordering and review of radiographic studies, pulse oximetry and re-evaluation of patient's condition.  MDM   1. Hyperglycemia   2. Dehydration   3. Stroke     Patient with significant dehydration marked hyperglycemia. No evidence of ketoacidosis. Patient's urinalysis is still pending. Requested a Foley catheter. Patient will require admission. Patient started on.glucose stabilizer for the high blood sugars. Patient also receiving IV hydration. Chest x-ray was negative for pneumonia or pulmonary edema. Patient  did have a leukocytosis. Patient's mental status is baseline as per family members. Head CT was negative for any acute changes.   I personally performed the services described in this documentation, which was scribed in my presence. The recorded information has been reviewed and is accurate.      Shelda JakesScott W. Chin Wachter, MD 04/21/13 2035

## 2013-04-21 NOTE — ED Notes (Signed)
CRITICAL VALUE ALERT  Critical value received:  Glucose 703  Date of notification:  04/21/2013  Time of notification:  1913  Critical value read back: yes  Nurse who received alert:  Bennetta LaosPenny Kelii Chittum,rn  MD notified (1st page):    Time of first page:    MD notified (2nd page):  Time of second page:   Responding MD:  zackowski  Time MD responded:  [phone]

## 2013-04-21 NOTE — H&P (Signed)
PCP:   Catalina PizzaHALL, ZACH, MD   Chief Complaint:  Not swallowing or eating x 1 week  HPI: 80 yo female h/o bilateral amputee, dm, prev cva, possible early dementia comes in with family members with one week of not eating or drinking much.  Pt started having difficulty in swallowing about one week ago.  Daughter says that when she goes to swallow, she chokes and seems to have a problem with swallowing.  No drooling.  No fevers.  No n/v/d.  She does not vomit after eating.  She does not complain of any pain.  She had a swallow evaluation several years ago which was normal.  She will swallow her pills.  No sob.  Coughing associated with swallowing only.    Review of Systems:  Positive and negative as per HPI otherwise all other systems are negative  Past Medical History: Past Medical History  Diagnosis Date  . HTN (hypertension)   . Stroke   . Diabetes mellitus without complication    Past Surgical History  Procedure Laterality Date  . Below knee leg amputation      left  . 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 Lump removed      from left breast  . S/p hysterectomy    . Cholecystectomy    . Esophagogastroduodenoscopy  09/23/08    SLF:no barrett's ring ,mass or stricture    Medications: Prior to Admission medications   Medication Sig Start Date End Date Taking? Authorizing Provider  aspirin EC 81 MG tablet Take 81 mg by mouth daily.   Yes Historical Provider, MD  lisinopril-hydrochlorothiazide (PRINZIDE,ZESTORETIC) 20-25 MG per tablet Take 1 tablet by mouth 2 (two) times daily.   Yes Historical Provider, MD  lovastatin (MEVACOR) 20 MG tablet Take 20 mg by mouth at bedtime.   Yes Historical Provider, MD  metoCLOPramide (REGLAN) 10 MG tablet Take 10 mg by mouth 4 (four) times daily.   Yes Historical Provider, MD  metoCLOPramide (REGLAN) 5 MG tablet Take 5 mg by mouth 4 (four) times daily. 03/18/11  Yes West BaliSandi L Fields, MD  omeprazole (PRILOSEC) 20 MG capsule  Take 20 mg by mouth daily. 05/26/12  Yes Joselyn ArrowKandice L Jones, NP  potassium chloride SA (K-DUR,KLOR-CON) 20 MEQ tablet Take 20 mEq by mouth 2 (two) times daily.     Yes Historical Provider, MD  sitaGLIPtin (JANUVIA) 100 MG tablet Take 100 mg by mouth daily.   Yes Historical Provider, MD    Allergies:   Allergies  Allergen Reactions  . Penicillins     REACTION: unknown reaction    Social History:  reports that she has never smoked. She does not have any smokeless tobacco history on file. She reports that she does not drink alcohol or use illicit drugs.  Family History: History reviewed. No pertinent family history.  Physical Exam: Filed Vitals:   04/21/13 1750 04/21/13 1900 04/21/13 2000  BP: 119/52 143/58 146/62  Pulse: 85    Temp: 98.2 F (36.8 C)    TempSrc: Oral    Resp: 19 13 20   SpO2: 96%     General appearance: alert, cooperative and no distress Head: Normocephalic, without obvious abnormality, atraumatic Eyes: negative Nose: Nares normal. Septum midline. Mucosa normal. No drainage or sinus tenderness. Neck: no JVD and supple, symmetrical, trachea midline Lungs: clear to auscultation bilaterally Heart: regular rate and rhythm, S1, S2 normal, no murmur, click, rub or gallop Abdomen: soft, non-tender; bowel sounds  normal; no masses,  no organomegaly Extremities:bilateral amputee with bilateral prosthetics Pulses: 2+ and symmetric Skin: Skin color, texture, turgor normal. No rashes or lesions Neurologic: Grossly normal    Labs on Admission:   Recent Labs  04/21/13 1819  NA 143  K 5.0  CL 99  CO2 24  GLUCOSE 703*  BUN 53*  CREATININE 1.47*  CALCIUM 9.6    Recent Labs  04/21/13 1819  AST 13  ALT 7  ALKPHOS 99  BILITOT 1.2  PROT 7.7  ALBUMIN 3.6    Recent Labs  04/21/13 1819  LIPASE 19    Recent Labs  04/21/13 1819  WBC 16.7*  NEUTROABS 15.4*  HGB 13.8  HCT 41.7  MCV 87.8  PLT 293    Recent Labs  04/21/13 1819  TROPONINI <0.30    Radiological Exams on Admission: Dg Chest 1 View  04/21/2013   CLINICAL DATA:  Dysphagia  EXAM: CHEST - 1 VIEW  COMPARISON:  December 20, 2009  FINDINGS: Lungs are clear. Heart is upper normal in size with normal pulmonary vascularity. No adenopathy. No bone lesions. There is atherosclerotic change in the aorta.  IMPRESSION: No edema or consolidation.   Electronically Signed   By: Bretta Bang M.D.   On: 04/21/2013 19:19   Ct Head Wo Contrast  04/21/2013   CLINICAL DATA:  Dysphagia.  Altered mental status.  EXAM: CT HEAD WITHOUT CONTRAST  TECHNIQUE: Contiguous axial images were obtained from the base of the skull through the vertex without intravenous contrast.  COMPARISON:  05/27/2012.  FINDINGS: Stable enlarged ventricles and subarachnoid spaces. Minimal patchy white matter low density is again demonstrated in both cerebral hemispheres. No intracranial hemorrhage, mass lesion or CT evidence of acute infarction. Unremarkable bones and included paranasal sinuses.  IMPRESSION: No acute abnormality. Stable atrophy and minimal chronic small vessel white matter ischemic changes.   Electronically Signed   By: Gordan Payment M.D.   On: 04/21/2013 19:26    Assessment/Plan  80 yo female with dysphagia for a week, dehydrated and HONK  Principal Problem:   Hyperosmolar non-ketotic state in patient with type 2 diabetes mellitus-  Place on insulin gtt.  Not dka.  ivf overnight.  Stepdown overnight.  Active Problems:   HYPERLIPIDEMIA   HYPERTENSION   PERIPHERAL VASCULAR DISEASE   COPD   GERD (gastroesophageal reflux disease)   Acute renal failure-  Due to dehydration.  Repeat labs in am.  ua is pending to r/o infectious component   Dehydration   h/o Stroke   CAD (coronary artery disease)   Hyperglycemia   Dysphagia-  Sounds may be a mechanical component, will proceed with mbss, may need egd.  Keep  Npo for now, sounds like she is aspirating and cxr is negative for any pna at this  time.    Briar Witherspoon A 04/21/2013, 9:09 PM

## 2013-04-22 DIAGNOSIS — IMO0002 Reserved for concepts with insufficient information to code with codable children: Secondary | ICD-10-CM

## 2013-04-22 DIAGNOSIS — E1151 Type 2 diabetes mellitus with diabetic peripheral angiopathy without gangrene: Secondary | ICD-10-CM

## 2013-04-22 DIAGNOSIS — R1310 Dysphagia, unspecified: Secondary | ICD-10-CM

## 2013-04-22 DIAGNOSIS — E1165 Type 2 diabetes mellitus with hyperglycemia: Secondary | ICD-10-CM

## 2013-04-22 DIAGNOSIS — E1159 Type 2 diabetes mellitus with other circulatory complications: Secondary | ICD-10-CM

## 2013-04-22 LAB — CBC
HEMATOCRIT: 38.9 % (ref 36.0–46.0)
HEMOGLOBIN: 12.6 g/dL (ref 12.0–15.0)
MCH: 28.2 pg (ref 26.0–34.0)
MCHC: 32.4 g/dL (ref 30.0–36.0)
MCV: 87 fL (ref 78.0–100.0)
Platelets: 270 10*3/uL (ref 150–400)
RBC: 4.47 MIL/uL (ref 3.87–5.11)
RDW: 13.3 % (ref 11.5–15.5)
WBC: 12.3 10*3/uL — AB (ref 4.0–10.5)

## 2013-04-22 LAB — GLUCOSE, CAPILLARY
GLUCOSE-CAPILLARY: 142 mg/dL — AB (ref 70–99)
GLUCOSE-CAPILLARY: 206 mg/dL — AB (ref 70–99)
GLUCOSE-CAPILLARY: 220 mg/dL — AB (ref 70–99)
GLUCOSE-CAPILLARY: 227 mg/dL — AB (ref 70–99)
Glucose-Capillary: 128 mg/dL — ABNORMAL HIGH (ref 70–99)
Glucose-Capillary: 132 mg/dL — ABNORMAL HIGH (ref 70–99)
Glucose-Capillary: 133 mg/dL — ABNORMAL HIGH (ref 70–99)
Glucose-Capillary: 209 mg/dL — ABNORMAL HIGH (ref 70–99)

## 2013-04-22 LAB — BASIC METABOLIC PANEL
BUN: 41 mg/dL — ABNORMAL HIGH (ref 6–23)
CHLORIDE: 114 meq/L — AB (ref 96–112)
CO2: 26 mEq/L (ref 19–32)
Calcium: 9.2 mg/dL (ref 8.4–10.5)
Creatinine, Ser: 0.93 mg/dL (ref 0.50–1.10)
GFR calc non Af Amer: 58 mL/min — ABNORMAL LOW (ref >90)
GFR, EST AFRICAN AMERICAN: 67 mL/min — AB (ref >90)
Glucose, Bld: 180 mg/dL — ABNORMAL HIGH (ref 70–99)
POTASSIUM: 3.5 meq/L — AB (ref 3.7–5.3)
Sodium: 154 mEq/L — ABNORMAL HIGH (ref 137–147)

## 2013-04-22 LAB — HEMOGLOBIN A1C
HEMOGLOBIN A1C: 12.3 % — AB (ref <5.7)
Mean Plasma Glucose: 306 mg/dL — ABNORMAL HIGH (ref <117)

## 2013-04-22 LAB — MRSA PCR SCREENING: MRSA BY PCR: NEGATIVE

## 2013-04-22 MED ORDER — ENOXAPARIN SODIUM 40 MG/0.4ML ~~LOC~~ SOLN
40.0000 mg | SUBCUTANEOUS | Status: DC
  Administered 2013-04-23 – 2013-04-24 (×2): 40 mg via SUBCUTANEOUS
  Filled 2013-04-22 (×2): qty 0.4

## 2013-04-22 MED ORDER — INSULIN ASPART 100 UNIT/ML ~~LOC~~ SOLN
0.0000 [IU] | Freq: Four times a day (QID) | SUBCUTANEOUS | Status: DC
  Administered 2013-04-22: 3 [IU] via SUBCUTANEOUS
  Administered 2013-04-23 (×3): 1 [IU] via SUBCUTANEOUS

## 2013-04-22 MED ORDER — INSULIN ASPART 100 UNIT/ML ~~LOC~~ SOLN
0.0000 [IU] | Freq: Once | SUBCUTANEOUS | Status: AC
  Administered 2013-04-22: 2 [IU] via SUBCUTANEOUS

## 2013-04-22 MED ORDER — INSULIN ASPART 100 UNIT/ML ~~LOC~~ SOLN
0.0000 [IU] | Freq: Three times a day (TID) | SUBCUTANEOUS | Status: DC
  Administered 2013-04-22: 3 [IU] via SUBCUTANEOUS

## 2013-04-22 MED ORDER — POTASSIUM CHLORIDE IN NACL 20-0.45 MEQ/L-% IV SOLN
INTRAVENOUS | Status: DC
Start: 2013-04-22 — End: 2013-04-24
  Administered 2013-04-22 – 2013-04-24 (×3): via INTRAVENOUS
  Filled 2013-04-22 (×6): qty 1000

## 2013-04-22 NOTE — Progress Notes (Signed)
Patient transfering to room 317. Vital signs stable. Report given to Sammuel BailiffSarah Heath RN.

## 2013-04-22 NOTE — Progress Notes (Signed)
PROGRESS NOTE  Sara GuarneriHelen W [name] ZOX:[id number] DOB: [birth year] DOA: 04/21/2013 PCP: Catalina PizzaHALL, ZACH, MD  Summary: 80 year old woman with history of type 2 diabetes mellitus, bilateral lower extremity amputations presented with one-week history of dysphagia and poor oral intake. Admitted for hyperglycemia without ketosis and further evaluation of dysphagia.  Assessment/Plan: 1. Hyperosmolar, hyperglycemia without ketosis.  Hyperglycemia resolved with insulin infusion. 2. Dysphagia. Etiology and significance unclear. 3. Acute renal failure. Nearly resolved with IV fluids. Secondary to hyperglycemia, poor oral intake and fluid losses. Likely complicated by lisinopril, hydrochlorothiazide currently on hold. 4. DM type 2 with PVD, bilateral LE amputee. Hold Januvia. Suspect uncontrolled. 5. HTN. Stable. 6. History of stroke. Continue aspirin. 7. Dementia?    Improved, etiology of hyperglycemia unclear. No evidence of infection, ACS. Only on oral meds as outpatient. Continue SSI, assess 24 hour insulin requirement. F/u Hgb A1c.  Remain NPO. ST evaluation in AM.  Change to 1/2 NS  BMP, Mg and CBC in AM  Replace potassium.  Transfer to floor  Code Status: full code DVT prophylaxis: Lovenox Family Communication: none present Disposition Plan: pending, likely return home  Brendia Sacksaniel Genelle Economou, MD  [facility]  Pager [phone] If 7PM-7AM, please contact night-coverage at [email or web address], password [email or web address] 04/22/2013, 7:58 AM  LOS: 1 day   Consultants:  ST  Procedures:    Antibiotics:    HPI/Subjective: No issues overnight per RN. Off insulin infusion now and started on SSI. Patient denies complaints. No abdominal pain, nausea or vomiting.  Objective: Filed Vitals:   04/22/13 0300 04/22/13 0400 04/22/13 0500 04/22/13 0600  BP: 133/67 127/63 142/69 153/80  Pulse:      Temp:  98 F (36.7 C)    TempSrc:  Axillary    Resp: 16 17 16 17   Height:      Weight:   49 kg (108 lb 0.4 oz)     SpO2:        Intake/Output Summary (Last 24 hours) at 04/22/13 0758 Last data filed at 04/22/13 0600  Gross per 24 hour  Intake 558.97 ml  Output    400 ml  Net 158.97 ml     Filed Weights   04/21/13 2300 04/22/13 0500  Weight: 51.4 kg (113 lb 5.1 oz) 49 kg (108 lb 0.4 oz)    Exam:   Afebrile, vital signs stable.  General: appears calm and comfortable.  Psych: appears confused but alert and follows simple commands  CV: RRR, no m/r/g. No LE edema  Telemetry: SR  Resp: CTA bilaterally, no w/r/r. Normal resp effort  Abdomen soft, ntnd  Skin no rash or induration seen  Right eye with lateral deviation, chronicity unclear. Will talk with family. CT head negative.  Data Reviewed:  Blood sugars now 100.  Acute renal failure has resolved, creatinine 0.93.  Now with hyperchloridemia and hypernatremia.  Potassium 3.5.  Leukocytosis improved, 16.7 >> 12.3  Urinalysis negative.  CT head and chest x-ray unremarkable.  Scheduled Meds: . enoxaparin (LOVENOX) injection  30 mg Subcutaneous Q24H  . insulin aspart  0-9 Units Subcutaneous TID WC  . pneumococcal 23 valent vaccine  0.5 mL Intramuscular Tomorrow-1000   Continuous Infusions: . sodium chloride Stopped (04/22/13 0026)  . dextrose 5 % and 0.45% NaCl 75 mL/hr at 04/22/13 0600    Principal Problem:   Hyperosmolar non-ketotic state in patient with type 2 diabetes mellitus Active Problems:   HYPERLIPIDEMIA   HYPERTENSION   PERIPHERAL VASCULAR DISEASE   COPD   GERD (gastroesophageal reflux disease)  Acute renal failure   Dehydration   h/o Stroke   CAD (coronary artery disease)   Hyperglycemia   Dysphagia   DM (diabetes mellitus), type 2, uncontrolled, periph vascular complic   Time spent 20 minutes

## 2013-04-22 NOTE — Progress Notes (Signed)
Utilization review completed.  

## 2013-04-22 NOTE — Progress Notes (Signed)
Pt came to floor from ICU. Pt stable and in NAD. Will continue to monitor.

## 2013-04-23 LAB — GLUCOSE, CAPILLARY
GLUCOSE-CAPILLARY: 149 mg/dL — AB (ref 70–99)
GLUCOSE-CAPILLARY: 201 mg/dL — AB (ref 70–99)
Glucose-Capillary: 152 mg/dL — ABNORMAL HIGH (ref 70–99)
Glucose-Capillary: 146 mg/dL — ABNORMAL HIGH (ref 70–99)
Glucose-Capillary: 143 mg/dL — ABNORMAL HIGH (ref 70–99)
Glucose-Capillary: 69 mg/dL — ABNORMAL LOW (ref 70–99)

## 2013-04-23 LAB — BASIC METABOLIC PANEL
BUN: 22 mg/dL (ref 6–23)
CALCIUM: 8.8 mg/dL (ref 8.4–10.5)
CO2: 27 mEq/L (ref 19–32)
CREATININE: 0.63 mg/dL (ref 0.50–1.10)
Chloride: 105 mEq/L (ref 96–112)
GFR calc non Af Amer: 84 mL/min — ABNORMAL LOW (ref >90)
Glucose, Bld: 161 mg/dL — ABNORMAL HIGH (ref 70–99)
Potassium: 3.8 mEq/L (ref 3.7–5.3)
Sodium: 145 mEq/L (ref 137–147)

## 2013-04-23 LAB — MAGNESIUM: Magnesium: 1.7 mg/dL (ref 1.5–2.5)

## 2013-04-23 LAB — CBC
HCT: 40.8 % (ref 36.0–46.0)
Hemoglobin: 13.5 g/dL (ref 12.0–15.0)
MCH: 29.1 pg (ref 26.0–34.0)
MCHC: 33.1 g/dL (ref 30.0–36.0)
MCV: 87.9 fL (ref 78.0–100.0)
PLATELETS: 270 10*3/uL (ref 150–400)
RBC: 4.64 MIL/uL (ref 3.87–5.11)
RDW: 13.4 % (ref 11.5–15.5)
WBC: 8.9 10*3/uL (ref 4.0–10.5)

## 2013-04-23 MED ORDER — INSULIN ASPART 100 UNIT/ML ~~LOC~~ SOLN
0.0000 [IU] | Freq: Three times a day (TID) | SUBCUTANEOUS | Status: DC
  Administered 2013-04-24: 3 [IU] via SUBCUTANEOUS
  Administered 2013-04-24: 5 [IU] via SUBCUTANEOUS
  Administered 2013-04-24: 3 [IU] via SUBCUTANEOUS

## 2013-04-23 NOTE — Progress Notes (Signed)
PROGRESS NOTE  [name]:[id number] DOB: May 31, 1933 DOA: 04/21/2013 PCP: Catalina Pizza, MD  Summary: 80 year old woman with history of type 2 diabetes mellitus, bilateral lower extremity amputations presented with one-week history of dysphagia and poor oral intake. Admitted for hyperglycemia without ketosis and further evaluation of dysphagia.  Assessment/Plan: 1. Hyperosmolar, hyperglycemia without ketosis.  Hyperglycemia resolved with insulin infusion. 2. Dysphagia. Etiology and significance unclear. 3. Acute renal failure. Resolved with IV fluids. Secondary to hyperglycemia, poor oral intake and fluid losses. Likely complicated by lisinopril, hydrochlorothiazide currently on hold. 4. DM type 2 with PVD, bilateral LE amputee. Hold Januvia. Plan as below. 5. HTN. Stable. 6. History of stroke. Continue aspirin. 7. Dementia?    Improving. Discussed with daughter/healthcare power of attorney by telephone. Son and daughter live with the patient, patient requires total care except usually able to feed self. We discussed testing and treatment.  Continue sliding scale insulin, fairly minimal requirement. Daughter reports the patient is on insulin home, she will get this information and provided.  Speech therapy evaluation.  Anticipate discharge next 48 hours.  Code Status: full code DVT prophylaxis: Lovenox Family Communication:  Disposition Plan: pending, likely return home  Brendia Sacks, MD  [facility]  Pager [phone] If 7PM-7AM, please contact night-coverage at [email or web address], password [facility] 04/23/2013, 11:41 AM  LOS: 2 days   Consultants:  ST  Procedures:    Antibiotics:    HPI/Subjective: No issues charted overnight. Patient denies complaints.  Objective: Filed Vitals:   04/22/13 1300 04/22/13 1434 04/22/13 2027 04/23/13 0457  BP: 163/72 150/86 157/95 157/63  Pulse:   82 66  Temp:  98.4 F (36.9 C) 98.3 F (36.8 C) 98.1 F (36.7 C)  TempSrc:    Oral Oral  Resp: 15 16 16 16   Height:      Weight:      SpO2:  98% 100% 98%    Intake/Output Summary (Last 24 hours) at 04/23/13 1141 Last data filed at 04/23/13 0800  Gross per 24 hour  Intake 1497.5 ml  Output   1000 ml  Net  497.5 ml     Filed Weights   04/21/13 2300 04/22/13 0500  Weight: 51.4 kg (113 lb 5.1 oz) 49 kg (108 lb 0.4 oz)    Exam:   Afebrile, vital signs stable. No hypoxia.  General: Appears calm and comfortable.  Cardiovascular regular rate and rhythm. No murmurs 2/6 systolic. No rub or gallop. No lower [place] edema. No stump edema. Bilateral BKA.  Respiratory clear to auscultation bilaterally. No wheezes, rales or rhonchi. Normal respiratory effort.  Abdomen is soft nontender nondistended.  Psychiatric somewhat odd affect. Follow simple commands. Right eye is deviated outwards, per discussion with daughter this is a chronic finding.  Data Reviewed:  Blood sugars stable. Hypernatremia has resolved. BUN and creatinine are normal. Magnesium normal.  CBC normal.  Hemoglobin A1c 12.3   Scheduled Meds: . enoxaparin (LOVENOX) injection  40 mg Subcutaneous Q24H  . insulin aspart  0-9 Units Subcutaneous Q6H   Continuous Infusions: . 0.45 % NaCl with KCl 20 mEq / L 75 mL/hr at 04/23/13 0001    Principal Problem:   Hyperosmolar non-ketotic state in patient with type 2 diabetes mellitus Active Problems:   HYPERLIPIDEMIA   HYPERTENSION   PERIPHERAL VASCULAR DISEASE   COPD   GERD (gastroesophageal reflux disease)   Acute renal failure   Dehydration   h/o Stroke   CAD (coronary artery disease)   Hyperglycemia   Dysphagia   DM (  diabetes mellitus), type 2, uncontrolled, periph vascular complic   Time spent 20 minutes

## 2013-04-23 NOTE — Progress Notes (Signed)
Spoke with patient's daughter [name]([name]) over the phone about diabetes and outpatient regimen for diabetes control.  [name] that she and her brother manage her mothers diabetes as an outpatient. She [name] that her mother sees Dr. Dwana MelenaZack [name] and he manages her diabetes and prescribes her medications for diabetes.  Inquired about DM medications and [name] that her mother takes Januvia, Lantus and Novolog (she does not know dosages of insulins and she is at work and unable to look on medication labels at this time).  [name] that [facility]  According to the daughter, the insulin (not sure which one or how much) should be given three times a day but due to the way her and her brother work the patient only receives insulin twice a day. [name] that her mother's blood sugar is checked twice a day (first thing in the morning at in the evening).  [name] that her mother's blood sugar is usually in the 90's mg/dl in the morning and up to 300-400's mg/dl by the evening.  Inquired about knowledge regarding A1C and [name] that she is "well aware of what an A1C is".  Discussed A1C results (12.3% on 04/22/13) and importance of checking CBGs and maintaining good CBG control to prevent long-term and short-term complications. Discussed impact of nutrition on diabetes control.  [name] she "knows exactly what type of foods she should and should not be giving her mother."  She [name] that they have a girl that comes in for 3 hours each day and her aunt that lives next door that fix her mother foods that she should not be eating, "they give her whatever she wants".  Inquired about having RD call her to discuss diet but she declined and [name] that "we have been through this several times and I already know what she should and should not eat so it would be pointless".  Informed [name] about the free outpatient diabetes education class  [facility] that perhaps she could ask the caregiver and the aunt to attend to learn the importance of following a carbohydrate modified diabetic diet.  However, she declined information and states "it is pointless".  [name] of information discussed and [name] that she has no further questions related to her mother's diabetes at this time.     [facility] last insulin refill was in July 2014 for one vial of Lantus and one vial of Novolog.  The prescribed dose of Lantus is 15 units QHS and the Novolog is 5 units BID (with lunch and supper). Since it has been over 7 months since last insulin refill. Called Dr. Scharlene [name]'s office to inquire about DM medications prescribed.  According to Dr. Scharlene [name]'s office, patient is prescribed Januvia 100 mg daily, Lantus 15 units QHS and Novolog 5 units BID (with lunch and supper) for diabetes management.  According to the office, [facility].  Will let Amy, RN, CM know insulins have not been refilled in over 7 months and will text page Dr. Irene [name] to be sure he is aware.  Thanks, Sara PennerMarie Kortnie Stovall, RN, MSN, CCRN Diabetes Coordinator Inpatient Diabetes [facility] [phone] (Team Pager) [phone] (AP office) [phone] [facility] office)

## 2013-04-23 NOTE — Progress Notes (Signed)
INITIAL NUTRITION ASSESSMENT  DOCUMENTATION CODES Per approved criteria  -Not Applicable   INTERVENTION: Follow for diet advancement and swallow eval recommendations   NUTRITION DIAGNOSIS: Inadequate oral intake related to dysphagia as evidenced by NPO, awaiting swallow eval.   Goal: Pt will meet >90% of estimated nutritional needs  Monitor:  Diet advancement/tolerance, PO intake, labs, skin assessments, I/O's, changes in status  Reason for Assessment: MST=3, MD consult for DM edu  80 y.o. female  Admitting Dx: Hyperosmolar non-ketotic state in patient with type 2 diabetes mellitus  ASSESSMENT: Pt admitted with hyperglycemia. Hgb A1c pending.  Pt has had dysphagia x 1 week. Last swallow evaluation was several years ago and yielded normal results. Awaiting MBSS. Currently NPO due to risk of aspiration.  Noted wt loss of  47# (25%) x 2 years ago. No weight hx data available for more recent weight loss.  Pt inappropriate for interview and exam due to dementia. Noted no depletion on eyes and temples. Noted mild depletion on clavicle.  No family members present so unable to complete DM edu, as pt is inappropriate for education.   Height: Ht Readings from Last 1 Encounters:  04/21/13 5\' 2"  (1.575 m)    Weight: Wt Readings from Last 1 Encounters:  04/22/13 108 lb 0.4 oz (49 kg)    Ideal Body Weight: 110#  % Ideal Body Weight: 98%  Wt Readings from Last 10 Encounters:  04/22/13 108 lb 0.4 oz (49 kg)  03/18/11 145 lb (65.772 kg)    Usual Body Weight: 145#  % Usual Body Weight: 74%  BMI:  Body mass index is 19.75 kg/(m^2). Meets criteria for normal weight based on current BMI.   Estimated Nutritional Needs: Kcal: 1610-96041078-1225 daily Protein: 49-61 grams daily Fluid: 1.1-1.2 L daily  Skin: Intact  Diet Order: NPO  EDUCATION NEEDS: -Education not appropriate at this time   Intake/Output Summary (Last 24 hours) at 04/23/13 1227 Last data filed at 04/23/13 0800  Gross per 24 hour  Intake 1497.5 ml  Output   1000 ml  Net  497.5 ml    Last BM: 04/20/13   Labs:   Recent Labs Lab 04/21/13 1819 04/22/13 0448 04/23/13 0542  NA 143 154* 145  K 5.0 3.5* 3.8  CL 99 114* 105  CO2 24 26 27   BUN 53* 41* 22  CREATININE 1.47* 0.93 0.63  CALCIUM 9.6 9.2 8.8  MG  --   --  1.7  GLUCOSE 703* 180* 161*    CBG (last 3)   Recent Labs  04/23/13 0005 04/23/13 0558 04/23/13 1128  GLUCAP 149* 146* 143*    Scheduled Meds: . enoxaparin (LOVENOX) injection  40 mg Subcutaneous Q24H  . insulin aspart  0-9 Units Subcutaneous Q6H    Continuous Infusions: . 0.45 % NaCl with KCl 20 mEq / L 75 mL/hr at 04/23/13 0001    Past Medical History  Diagnosis Date  . HTN (hypertension)   . Stroke   . Diabetes mellitus without complication     Past Surgical History  Procedure Laterality Date  . Below knee leg amputation      left  . 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 Lump removed      from left breast  . S/p hysterectomy    . Cholecystectomy    . Esophagogastroduodenoscopy  09/23/08    SLF:no barrett's ring ,mass or stricture    Onnie Hatchel A. Mayford KnifeWilliams, RD, LDN Pager: [phone]

## 2013-04-23 NOTE — Care Management Note (Unsigned)
    Page 1 of 1   04/23/2013     3:00:21 PM   CARE MANAGEMENT NOTE 04/23/2013  Patient:  [name],[name]   Account Number:  [id number]  Date Initiated:  04/23/2013  Documentation initiated by:  Rosemary HolmsOBSON,Saafir Abdullah  Subjective/Objective Assessment:   Pt from home where she lives with her son and daughter. Per diabetic coordinator, daughter stated that a caregiver come for 3 hours each day and a family member checks on her as well.     Action/Plan:   Will discuss with MD. Should PT evaluate her for potential placement. Daughter and Son both work.   Anticipated DC Date:     Anticipated DC Plan:        DC Planning Services  CM consult      Choice offered to / List presented to:             Status of service:  In process, will continue to follow Medicare Important Message given?   (If response is "NO", the following Medicare IM given date fields will be blank) Date Medicare IM given:   Date Additional Medicare IM given:    Discharge Disposition:    Per UR Regulation:    If discussed at Long Length of Stay Meetings, dates discussed:    Comments:  04/23/13 Rosemary HolmsAmy Senaya Dicenso RN BSN CM

## 2013-04-23 NOTE — Evaluation (Signed)
Clinical/Bedside Swallow Evaluation  Patient Details  Name: [name] MRN: [id number] Date of Birth: 02/26/1934  Today's Date: 04/23/2013 Time: [id number] SLP Time Calculation (min): 27 min  Past Medical History:  Past Medical History  Diagnosis Date  . HTN (hypertension)   . Stroke   . Diabetes mellitus without complication    Past Surgical History:  Past Surgical History  Procedure Laterality Date  . Below knee leg amputation      left  . 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 Lump removed      from left breast  . S/p hysterectomy    . Cholecystectomy    . Esophagogastroduodenoscopy  09/23/08    SLF:no barrett's ring ,mass or stricture   HPI:  80 yo female h/o bilateral amputee, dm, prev cva, possible early dementia comes in with family members with one week of not eating or drinking much. Pt started having difficulty in swallowing about one week ago. Daughter says that when she goes to swallow, she chokes and seems to have a problem with swallowing. No drooling. No fevers. No n/v/d. She does not vomit after eating. She does not complain of any pain. She had a swallow evaluation several years ago which was normal. She will swallow her pills. No sob. Coughing associated with swallowing only per chart review. Chest x-ray shows: 04/21/2013 CLINICAL DATA: Dysphagia EXAM: CHEST - 1 VIEW COMPARISON: December 20, 2009 FINDINGS: Lungs are clear. Heart is upper normal in size with normal pulmonary vascularity. No adenopathy. No bone lesions. There is atherosclerotic change in the aorta. IMPRESSION: No edema or consolidation. Electronically Signed By: Bretta BangWilliam Woodruff M.D   Assessment / Plan / Recommendation Clinical Impression  [name], was admitted with decreased po intake and reported difficulty swallowing. Pt pleasant, alert, slightly confused. She follows directions with delayed responses, but was unable to produce voluntary cough and swallow (just  stared at me and waved head). Pt agreeable to po trials despite reporting no appetite. Tongue is coated and large in size. Pt consumed 240 ml water, 4 oz applesauce, and graham crackers without incident. Pt was able to self feed when presented in hand. Dentition is sparse in and poor repair, however she mangaged to clear regular textures without cues. Pt shows no overt signs or symptom consistent with aspiration nor difficulty swallowing/masticating. No family present to interview. Chest x-ray is clear, no need for objective study unless MD desires (admitting MD requested). SLP will follow up with RN and MD. Thank you.     Aspiration Risk  None    Diet Recommendation Regular;Thin liquid (chopped meats)   Liquid Administration via: Cup;Straw Medication Administration: Whole meds with liquid Supervision: Patient able to self feed;Intermittent supervision to cue for compensatory strategies Compensations: Slow rate Postural Changes and/or Swallow Maneuvers: Seated upright 90 degrees;Upright 30-60 min after meal    Other  Recommendations Oral Care Recommendations: Oral care BID;Staff/trained caregiver to provide oral care Other Recommendations: Clarify dietary restrictions   Follow Up Recommendations  None    Frequency and Duration min 1 x/week  1 week      SLP Swallow Goals  See care plan   Swallow Study Prior Functional Status       General Date of Onset: 04/21/13 HPI: 80 yo female h/o bilateral amputee, dm, prev cva, possible early dementia comes in with family members with one week of not eating or drinking much. Pt started having difficulty in swallowing  about one week ago. Daughter says that when she goes to swallow, she chokes and seems to have a problem with swallowing. No drooling. No fevers. No n/v/d. She does not vomit after eating. She does not complain of any pain. She had a swallow evaluation several years ago which was normal. She will swallow her pills. No sob. Coughing  associated with swallowing only per chart review. Chest x-ray shows: 04/21/2013 CLINICAL DATA: Dysphagia EXAM: CHEST - 1 VIEW COMPARISON: December 20, 2009 FINDINGS: Lungs are clear. Heart is upper normal in size with normal pulmonary vascularity. No adenopathy. No bone lesions. There is atherosclerotic change in the aorta. IMPRESSION: No edema or consolidation. Electronically Signed By: Bretta Bang M.D Type of Study: Bedside swallow evaluation Previous Swallow Assessment: None on record Diet Prior to this Study: NPO Temperature Spikes Noted: No Respiratory Status: Room air History of Recent Intubation: No Behavior/Cognition: Alert;Cooperative;Requires cueing Oral Cavity - Dentition: Poor condition Self-Feeding Abilities: Able to feed self Patient Positioning: Upright in bed Baseline Vocal Quality: Clear;Low vocal intensity Volitional Cough: Cognitively unable to elicit Volitional Swallow: Unable to elicit    Oral/Motor/Sensory Function Labial ROM: Within Functional Limits Labial Symmetry: Within Functional Limits Labial Strength: Within Functional Limits Labial Sensation: Within Functional Limits Lingual ROM: Within Functional Limits Lingual Symmetry: Abnormal symmetry right (eye droop) Lingual Strength: Reduced Lingual Sensation: Within Functional Limits Facial ROM: Within Functional Limits Facial Symmetry: Right drooping eyelid Velum: Within Functional Limits Mandible: Within Functional Limits   Ice Chips Ice chips: Within functional limits Presentation: Spoon   Thin Liquid Thin Liquid: Within functional limits Presentation: Cup;Self Fed;Spoon;Straw    Nectar Thick Nectar Thick Liquid: Not tested   Honey Thick Honey Thick Liquid: Not tested   Puree Puree: Within functional limits Presentation: Spoon   Solid      Solid: Within functional limits Presentation: Self Fed;Spoon      Thank you,  Havery Moros, CCC-SLP [phone]  Velva Molinari 04/23/2013,3:11  PM

## 2013-04-24 DIAGNOSIS — N179 Acute kidney failure, unspecified: Secondary | ICD-10-CM

## 2013-04-24 DIAGNOSIS — R1310 Dysphagia, unspecified: Secondary | ICD-10-CM

## 2013-04-24 DIAGNOSIS — R7309 Other abnormal glucose: Secondary | ICD-10-CM

## 2013-04-24 DIAGNOSIS — E1159 Type 2 diabetes mellitus with other circulatory complications: Secondary | ICD-10-CM

## 2013-04-24 LAB — GLUCOSE, CAPILLARY
GLUCOSE-CAPILLARY: 244 mg/dL — AB (ref 70–99)
GLUCOSE-CAPILLARY: 283 mg/dL — AB (ref 70–99)
Glucose-Capillary: 219 mg/dL — ABNORMAL HIGH (ref 70–99)

## 2013-04-24 MED ORDER — INSULIN GLARGINE 100 UNIT/ML ~~LOC~~ SOLN: 10.0000 [IU] | Freq: Every day | SUBCUTANEOUS | Status: DC

## 2013-04-24 MED ORDER — INSULIN GLARGINE 100 UNIT/ML ~~LOC~~ SOLN
10.0000 [IU] | Freq: Every day | SUBCUTANEOUS | Status: DC
  Administered 2013-04-24: 10 [IU] via SUBCUTANEOUS
  Filled 2013-04-24 (×2): qty 0.1

## 2013-04-24 NOTE — Progress Notes (Signed)
Patient with orders to be discharge to [facility]. Discharge packet sent with patient. Report called to [facility], Hope, nurse. Patient stable. Patient transported via daughter.

## 2013-04-24 NOTE — Progress Notes (Signed)
PROGRESS NOTE  DANAJAH BIRDSELL RUE:[id number] DOB: 1934-01-24 DOA: 04/21/2013 PCP: Catalina Pizza, MD  Summary: 80 year old woman with history of type 2 diabetes mellitus, bilateral lower extremity amputations presented with one-week history of dysphagia and poor oral intake. Admitted for hyperglycemia without ketosis and further evaluation of dysphagia.  Assessment/Plan: 1. Hyperosmolar, hyperglycemia without ketosis.  Hyperglycemia resolved with insulin infusion. 2. Dysphagia. Appears resolved, tolerating diet without difficulty. If recurs, consider outpatient GI evaluation. 3. Acute renal failure. Resolved with IV fluids. Secondary to hyperglycemia, poor oral intake and fluid losses. Likely complicated by lisinopril, hydrochlorothiazide currently on hold. 4. DM type 2 with PVD, bilateral LE amputee. 5. HTN. Stable. 6. History of stroke. Continue aspirin. 7. Dementia suspected.   Resume Lantus. Continue sliding scale insulin.   Physical therapy evaluation.  Puzzling picture here. Daughter was interviewed at bedside with Foye Clock, RN present. Daughter insists patient continues on Lantus and NovoLog but has some difficulty describing dosing. She reports Lantus and NovoLog are filled at [facility], however I discussed today with pharmacist at that facility and no prescription has been filled since July 2014. I shared this with the daughter who remains adamant that that is where the prescriptions are filled and the patient continues on these medications.  Will discuss with Dr. Margo Aye.  To return home today pending physical therapy consultation.  Code Status: full code DVT prophylaxis: Lovenox Family Communication:  Disposition Plan: pending, likely return home  Brendia Sacks, MD  [facility]  Pager [phone] If 7PM-7AM, please contact night-coverage at [email or web address], password [facility] 04/24/2013, 1:38 PM  LOS: 3 days    Consultants:  ST  Procedures:    Antibiotics:    HPI/Subjective: No new issues. Daughter at bedside. Reports patient ate all of her breakfast and lunch without difficulty. Patient denies complaints.  Objective: Filed Vitals:   04/23/13 1425 04/23/13 2119 04/23/13 2150 04/24/13 0558  BP: 142/82 194/106 149/61 146/76  Pulse: 61 62 62 65  Temp: 98.3 F (36.8 C) 98.2 F (36.8 C)  98.9 F (37.2 C)  TempSrc: Oral Oral  Oral  Resp: 16 19  19   Height:      Weight:      SpO2: 98% 99%  97%    Intake/Output Summary (Last 24 hours) at 04/24/13 1338 Last data filed at 04/24/13 0827  Gross per 24 hour  Intake 2116.25 ml  Output    350 ml  Net 1766.25 ml     Filed Weights   04/21/13 2300 04/22/13 0500  Weight: 51.4 kg (113 lb 5.1 oz) 49 kg (108 lb 0.4 oz)    Exam:   Afebrile, vital signs stable. No hypoxia.  General: Appears calm and comfortable. Speech fluent and clear.  Psychiatric. Appears confused but alert and follows commands.  Cardiovascular regular rate and rhythm. No murmur, rub or gallop. No stump edema bilaterally.  Respiratory clear to auscultation bilaterally. No wheezes, rales or rhonchi. Normal respiratory effort.  Abdomen soft, nontender, nondistended.  Skin no rash or induration noted.  Data Reviewed:  Blood sugars stable. One episode of hypoglycemia yesterday.  Basic metabolic panel unremarkable. Magnesium normal.  CBC normal.  Hemoglobin A1c 12.3.   Scheduled Meds: . enoxaparin (LOVENOX) injection  40 mg Subcutaneous Q24H  . insulin aspart  0-9 Units Subcutaneous TID WC   Continuous Infusions: . 0.45 % NaCl with KCl 20 mEq / L 75 mL/hr at 04/24/13 6213    Principal Problem:   Hyperosmolar non-ketotic state in patient with type 2 diabetes mellitus  Active Problems:   HYPERLIPIDEMIA   HYPERTENSION   PERIPHERAL VASCULAR DISEASE   COPD   GERD (gastroesophageal reflux disease)   Acute renal failure   Dehydration   h/o  Stroke   CAD (coronary artery disease)   Hyperglycemia   Dysphagia   DM (diabetes mellitus), type 2, uncontrolled, periph vascular complic

## 2013-04-24 NOTE — Evaluation (Signed)
Physical Therapy Evaluation Patient Details Name: [name] MRN: [id number] DOB: 1933-12-16 Today's Date: 04/24/2013 Time: 4098-1191 PT Time Calculation (min): 35 min  PT Assessment / Plan / Recommendation History of Present Illness  Pt is admitted with hyperglycemia and difficulty swallowing.  She is a bilateral LE BKA who has had reduced appetite over recent time.  Daughter states that she has gotten progressively weaker and presumed dementia seems to be worsening.  She had been able to transfer from bed to w/c (with LE prosthetics on) independently but has recently been unable to do this and has had several falls.  She lives with son and daughter who are away during the day.  There is a CG who comes daily from 11-2 PM.and assists with bathing, dressing, etc.  Pt is able to propel her w/c independently in the home.  Daughter is interested in pt developing better transfer abiltiy to reduce her fall risk.  Clinical Impression   Pt was seen for evaluation.  She was alert and cooperative, able to follow simple directions.  She demonstrates generalized weakness in all 4 extremeties.  She was able to don LE prostheses with min assist and needed mod assist to transfer bed to chair with a pivot transfer.  She would benefit from short term SNF for general strengthening and transfer training in order to improve her independence and mobility at home as well as reduce her fall risk.    PT Assessment  Patient needs continued PT services    Follow Up Recommendations  SNF    Does the patient have the potential to tolerate intense rehabilitation      Barriers to Discharge Decreased caregiver support children at work during the day    Equipment Recommendations  None recommended by PT    Recommendations for Other Services     Frequency Min 3X/week    Precautions / Restrictions Precautions Precautions: Fall Restrictions Weight Bearing Restrictions: No   Pertinent Vitals/Pain        Mobility  Bed Mobility Overal bed mobility: Needs Assistance Bed Mobility: Supine to Sit Supine to sit: Min assist Transfers Overall transfer level: Needs assistance Equipment used: None Transfers: Stand Pivot Transfers Stand pivot transfers: Mod assist General transfer comment: pt was able to don stump sleeves independently, needed minimal assist to lock prostheses in place...these were worn to do transfer    Exercises     PT Diagnosis: Generalized weakness  PT Problem List: Decreased strength;Decreased activity tolerance;Decreased mobility;Decreased cognition PT Treatment Interventions: Functional mobility training;Therapeutic exercise;Patient/family education     PT Goals(Current goals can be found in the care plan section) Acute Rehab PT Goals Patient Stated Goal: daughter would like pt to have improved general strength in order to improve transfer safety/ability PT Goal Formulation: With patient/family Time For Goal Achievement: 05/01/13 Potential to Achieve Goals: Good  Visit Information  Last PT Received On: 04/24/13 History of Present Illness: Pt is admitted with hyperglycemia and difficulty swallowing.  She is a bilateral LE BKA who has had reduced appetite over recent time.  Daughter states that she has gotten progressively weaker and presumed dementia seems to be worsening.  She had been able to transfer from bed to w/c (with LE prosthetics on) independently but has recently been unable to do this and has had several falls.  She lives with son and daughter who are away during the day.  There is a CG who comes daily from 11-2 PM.and assists with bathing, dressing, etc.  Pt is able  to propel her w/c independently in the home.  Daughter is interested in pt developing better transfer abiltiy to reduce her fall risk.       Prior Functioning  Home Living Family/patient expects to be discharged to:: Skilled nursing facility Prior Function Level of Independence: Needs  assistance Gait / Transfers Assistance Needed: used to be able to ambulate with a walker and assist but is now unable to do so...she now needs assist with transfers ADL's / Homemaking Assistance Needed: asssist with all ADLs Communication Communication: No difficulties    Cognition  Cognition Arousal/Alertness: Awake/alert Behavior During Therapy: WFL for tasks assessed/performed Overall Cognitive Status: History of cognitive impairments - at baseline    Extremity/Trunk Assessment Lower Extremity Assessment Lower Extremity Assessment: Generalized weakness;RLE deficits/detail RLE Deficits / Details: pt lacks 30 degrees knee extension   Balance    End of Session PT - End of Session Equipment Utilized During Treatment: Gait belt Activity Tolerance: Patient tolerated treatment well Patient left: in bed;with family/visitor present;with bed alarm set;with call bell/phone within reach Nurse Communication: Mobility status  GP     Konrad PentaBrown, Kerria Sapien L 04/24/2013, 3:35 PM

## 2013-04-24 NOTE — Discharge Summary (Addendum)
Physician Discharge Summary  [name] WUJ:[id number] DOB: May 05, 1933 DOA: 04/21/2013  PCP: Catalina Pizza, MD  Admit date: 04/21/2013 Discharge date: 04/24/2013  Recommendations for Outpatient Follow-up:  1. DM ongoing care, titration of insulin. Suggest sliding scale insulin, consider meal coverage.   Follow-up Information   Follow up with Catalina Pizza, MD In 2 weeks.   Specialty:  Internal Medicine   Contact information:    [address]  [place] [address] [phone]      Discharge Diagnoses:  1. Hyperosmolar hyperglycemia without ketosis 2. Dysphagia 3. Acute renal failure 4. DM type 2 uncontrolled, with PVD, bilateral LE amputee 5. HTN 6. History of stroke  Discharge Condition: improved Disposition: SNF for rehab  Diet recommendation: Regular;Thin liquid (chopped meats)   Filed Weights   04/21/13 2300 04/22/13 0500  Weight: 51.4 kg (113 lb 5.1 oz) 49 kg (108 lb 0.4 oz)    History of present illness:  80 year old woman with history of type 2 diabetes mellitus, bilateral lower extremity amputations presented with one-week history of dysphagia and poor oral intake. Admitted for hyperglycemia without ketosis and further evaluation of dysphagia.  Hospital Course:  [name] was treated with insulin infusion with rapid resolution of hyperglycemia. Etiology unclear but noncompliance is suspected. No signs or symptoms of ACS, acute CNS event or infection. Dysphagia was investigated per speech therapy. Patient has tolerated diet without choking or vomiting and therefore no further evaluation pursued, ate all breakfast and lunch. PT recommended SNF. She is now stable for discharge. See individual issues below.  1. Hyperosmolar, hyperglycemia without ketosis. Hyperglycemia resolved with insulin infusion. 2. Dysphagia. Appears resolved, tolerating diet without difficulty. If recurs, consider outpatient GI evaluation. 3. Acute renal failure. Resolved with IV fluids. Secondary  to hyperglycemia, poor oral intake and fluid losses. Likely complicated by lisinopril, hydrochlorothiazide. 4. DM type 2 with PVD, bilateral LE amputee. Poorly controlled, compliance questioned. 5. HTN. Stable. 6. History of stroke. Continue aspirin. 7. Dementia suspected. Resume Lantus. Suggest sliding scale insulin.  Puzzling picture here. Daughter was interviewed at bedside with Foye Clock, RN present. Daughter insists patient continues on Lantus and NovoLog but has some difficulty describing dosing. She reports Lantus and NovoLog are filled at [facility], however I discussed today with pharmacist at that facility and no prescription has been filled since July 2014. I shared this with the daughter who remains adamant that that is where the prescriptions are filled and the patient continues on these medications.  Suggest home health after discharge from SNF.  Consultants:  ST: Regular;Thin liquid (chopped meats)  Procedures: none  Discharge Instructions     Medication List    STOP taking these medications       insulin aspart 100 UNIT/ML injection  Commonly known as:  novoLOG     metoCLOPramide 10 MG tablet  Commonly known as:  REGLAN     metoCLOPramide 5 MG tablet  Commonly known as:  REGLAN     sitaGLIPtin 100 MG tablet  Commonly known as:  JANUVIA      TAKE these medications       aspirin EC 81 MG tablet  Take 81 mg by mouth daily.     insulin glargine 100 UNIT/ML injection  Commonly known as:  LANTUS  Inject 0.1 mLs (10 Units total) into the skin daily. Start 2/11 AM     lisinopril-hydrochlorothiazide 20-25 MG per tablet  Commonly known as:  PRINZIDE,ZESTORETIC  Take 1 tablet by mouth 2 (two) times daily.  lovastatin 20 MG tablet  Commonly known as:  MEVACOR  Take 20 mg by mouth at bedtime.     omeprazole 20 MG capsule  Commonly known as:  PRILOSEC  Take 20 mg by mouth daily.     potassium chloride SA 20 MEQ tablet  Commonly known as:   K-DUR,KLOR-CON  Take 20 mEq by mouth 2 (two) times daily.       Allergies  Allergen Reactions  . Penicillins     REACTION: unknown reaction    The results of significant diagnostics from this hospitalization (including imaging, microbiology, ancillary and laboratory) are listed below for reference.    Significant Diagnostic Studies: Dg Chest 1 View  04/21/2013   CLINICAL DATA:  Dysphagia  EXAM: CHEST - 1 VIEW  COMPARISON:  December 20, 2009  FINDINGS: Lungs are clear. Heart is upper normal in size with normal pulmonary vascularity. No adenopathy. No bone lesions. There is atherosclerotic change in the aorta.  IMPRESSION: No edema or consolidation.   Electronically Signed   By: Bretta BangWilliam  Woodruff M.D.   On: 04/21/2013 19:19   Ct Head Wo Contrast  04/21/2013   CLINICAL DATA:  Dysphagia.  Altered mental status.  EXAM: CT HEAD WITHOUT CONTRAST  TECHNIQUE: Contiguous axial images were obtained from the base of the skull through the vertex without intravenous contrast.  COMPARISON:  05/27/2012.  FINDINGS: Stable enlarged ventricles and subarachnoid spaces. Minimal patchy white matter low density is again demonstrated in both cerebral hemispheres. No intracranial hemorrhage, mass lesion or CT evidence of acute infarction. Unremarkable bones and included paranasal sinuses.  IMPRESSION: No acute abnormality. Stable atrophy and minimal chronic small vessel white matter ischemic changes.   Electronically Signed   By: Gordan PaymentSteve  Reid M.D.   On: 04/21/2013 19:26    Microbiology: Recent Results (from the past 240 hour(s))  MRSA PCR SCREENING     Status: None   Collection Time    04/21/13 11:56 PM      Result Value Range Status   MRSA by PCR NEGATIVE  NEGATIVE Final   Comment:            The GeneXpert MRSA Assay (FDA     approved for NASAL specimens     only), is one component of a     comprehensive MRSA colonization     surveillance program. It is not     intended to diagnose MRSA     infection nor to  guide or     monitor treatment for     MRSA infections.     Labs: Basic Metabolic Panel:  Recent Labs Lab 04/21/13 1819 04/22/13 0448 04/23/13 0542  NA 143 154* 145  K 5.0 3.5* 3.8  CL 99 114* 105  CO2 24 26 27   GLUCOSE 703* 180* 161*  BUN 53* 41* 22  CREATININE 1.47* 0.93 0.63  CALCIUM 9.6 9.2 8.8  MG  --   --  1.7   Liver Function Tests:  Recent Labs Lab 04/21/13 1819  AST 13  ALT 7  ALKPHOS 99  BILITOT 1.2  PROT 7.7  ALBUMIN 3.6    Recent Labs Lab 04/21/13 1819  LIPASE 19   CBC:  Recent Labs Lab 04/21/13 1819 04/22/13 0448 04/23/13 0542  WBC 16.7* 12.3* 8.9  NEUTROABS 15.4*  --   --   HGB 13.8 12.6 13.5  HCT 41.7 38.9 40.8  MCV 87.8 87.0 87.9  PLT 293 270 270   Cardiac Enzymes:  Recent Labs Lab 04/21/13  1819  TROPONINI <0.30     Recent Labs  04/21/13 1819  PROBNP 584.8*   CBG:  Recent Labs Lab 04/23/13 1128 04/23/13 1647 04/23/13 2059 04/24/13 0731 04/24/13 1137  GLUCAP 143* 69* 201* 244* 283*    Principal Problem:   Hyperosmolar non-ketotic state in patient with type 2 diabetes mellitus Active Problems:   HYPERLIPIDEMIA   HYPERTENSION   PERIPHERAL VASCULAR DISEASE   COPD   GERD (gastroesophageal reflux disease)   Acute renal failure   Dehydration   h/o Stroke   CAD (coronary artery disease)   Hyperglycemia   Dysphagia   DM (diabetes mellitus), type 2, uncontrolled, periph vascular complic   Time coordinating discharge: 35 minutes  Signed:  Brendia Sacks, MD [facility] 04/24/2013, 4:12 PM

## 2013-04-24 NOTE — Clinical Social Work Placement (Signed)
Clinical Social Work Department CLINICAL SOCIAL WORK PLACEMENT NOTE 04/24/2013  Patient:  Sara GuarneriLYNN,[name]  Account Number:  [id number] Admit date:  04/21/2013  Clinical Social Worker:  Derenda FennelKARA Kale Rondeau, LCSW  Date/time:  04/24/2013 03:30 PM  Clinical Social Work is seeking post-discharge placement for this patient at the following level of care:   SKILLED NURSING   (*CSW will update this form in Epic as items are completed)   04/24/2013  Patient/family provided with [facility] Department of Clinical Social Work's list of facilities offering this level of care within the geographic area requested by the patient (or if unable, by the patient's family).  04/24/2013  Patient/family informed of their freedom to choose among providers that offer the needed level of care, that participate in Medicare, Medicaid or managed care program needed by the patient, have an available bed and are willing to accept the patient.  04/24/2013  Patient/family informed of [facility]' ownership interest in [facility], as well as of the fact that they are under no obligation to receive care at this facility.  PASARR submitted to EDS on 04/24/2013 PASARR number received from EDS on 04/24/2013  FL2 transmitted to all facilities in geographic area requested by pt/family on  04/24/2013 FL2 transmitted to all facilities within larger geographic area on   Patient informed that his/her managed care company has contracts with or will negotiate with  certain facilities, including the following:     Patient/family informed of bed offers received:  04/24/2013 [facility] [place] Physician recommends and [facility] [place]  [facility] [place] on  04/24/2013 Patient to be transferred to facility by family  The following physician request were entered in Epic:   Additional Comments:  Derenda FennelKara Natisha Trzcinski, LCSW [phone]

## 2013-04-24 NOTE — Clinical Social Work Psychosocial (Signed)
Clinical Social Work Department BRIEF PSYCHOSOCIAL ASSESSMENT 04/24/2013  Patient:  [name], [name]     Account Number:  [id number]     Admit date:  04/21/2013  Clinical Social Worker:  [name]  Date/Time:  04/24/2013 03:11 PM  Referred by:  Physician  Date Referred:  04/24/2013 Referred for  Psychosocial assessment   Other Referral:   Interview type:  Family Other interview type:   [name]- daughter    PSYCHOSOCIAL DATA Living Status:  FAMILY Admitted from facility:   Level of care:   Primary support name:  [name] Primary support relationship to patient:  CHILD, ADULT Degree of support available:   adequate    CURRENT CONCERNS Current Concerns  Post-Acute Placement   Other Concerns:    SOCIAL WORK ASSESSMENT / PLAN CSW received referral from diabetic coordinator due to concern that pt was not receiving insulin appropriately. Met with pt and daughter, [name] at bedside. Pt appeared to listen, but did not participate in assessment. [name] reports she is HCPOA. She and her brother live with pt, however they both work during the day. They have hired a Retail banker aid for 3 hours each day during the week, but are unable to afford additional hours. Pt is to have sugars checked and insulin given 3 times a day if needed per notes. Due to their work schedules this is only able to be done twice daily. MD called pharmacy and last reported prescription filled was July 2014. However, daughter insists that this is where they get pt's insulin. CSW discussed with both [place] and [place]. Both counties report they have not received any concerns in past. Pt's last hospitalization was last spring. Pt actually lives in [place]. Box report it would be more appropriate for home health RN and social worker to assess first.  [name] is concerned about pt falling as she has in the past. Feels pt is weak and would benefit from SNF as it  would encourage pt to work harder to get home. Home health has not been helpful in the past. PT evaluated pt and recommendation is for SNF. Pt has been to SNF after amputations. Daughter agreeable to [facility] [place], or [facility]. CSW initiated bed search there. Aware of Medicare coverage/criteria. They desire ST SNF and are hopeful pt can return home after that.   Assessment/plan status:  Psychosocial Support/Ongoing Assessment of Needs Other assessment/ plan:   Information/referral to community resources:   SNF list    PATIENT'S/FAMILY'S RESPONSE TO PLAN OF CARE: Pt did not participate in assessment. Daughter feels that ST placement would be beneficial for pt. CSW will follow up with bed offers when available. Pt stable today for d/c per MD.       [name], [place]

## 2013-04-24 NOTE — Clinical Social Work Note (Signed)
[name] presented bed offer in [place] and daughter felt that they would prefer [name] if available. [name] has offered a bed for today. Daughter to complete paperwork this afternoon and will transfer pt to SNF after that. Will fax d/c summary when complete.   [name], [place] [phone]

## 2013-04-24 NOTE — Progress Notes (Signed)
Inpatient Diabetes Program Recommendations  AACE/ADA: New Consensus Statement on Inpatient Glycemic Control (2013)  Target Ranges:  Prepandial:   less than 140 mg/dL      Peak postprandial:   less than 180 mg/dL (1-2 hours)      Critically ill patients:  140 - 180 mg/dL  Results for [name], Gwynevere W (MRN [id number]) as of 04/24/2013 08:15  Ref. Range 04/23/2013 05:58 04/23/2013 11:28 04/23/2013 16:47 04/23/2013 20:59 04/24/2013 07:31  Glucose-Capillary Latest Range: 70-99 mg/dL 284146 (H) 132143 (H) 69 (L) 201 (H) 244 (H)   Inpatient Diabetes Program Recommendations Insulin - Basal: Now that patient is eating diet, please consider ordering low dose basal; recommend starting with Lantus 5 units daily. Insulin-Correction: If basal insulin is ordered and postprandial glucose remains >200 mg/dl, may want to increase Novolog correction to moderate scale.  Thanks, Orlando PennerMarie Zailen Albarran, RN, MSN, CCRN Diabetes Coordinator Inpatient Diabetes Program [phone] (Team Pager) [phone] (AP office) [phone] [facility] office)

## 2013-09-21 ENCOUNTER — Inpatient Hospital Stay (HOSPITAL_COMMUNITY)
Admission: EM | Admit: 2013-09-21 | Discharge: 2013-09-24 | DRG: 689 | Disposition: A | Discharge status: Skilled Nursing Facility | Payer: Medicare Other | Attending: Family Medicine | Admitting: Family Medicine

## 2013-09-21 ENCOUNTER — Encounter (HOSPITAL_COMMUNITY): Payer: Self-pay | Admitting: Emergency Medicine

## 2013-09-21 DIAGNOSIS — E1165 Type 2 diabetes mellitus with hyperglycemia: Secondary | ICD-10-CM | POA: Diagnosis present

## 2013-09-21 DIAGNOSIS — E1159 Type 2 diabetes mellitus with other circulatory complications: Secondary | ICD-10-CM

## 2013-09-21 DIAGNOSIS — E1151 Type 2 diabetes mellitus with diabetic peripheral angiopathy without gangrene: Secondary | ICD-10-CM

## 2013-09-21 DIAGNOSIS — R4182 Altered mental status, unspecified: Secondary | ICD-10-CM

## 2013-09-21 DIAGNOSIS — IMO0002 Reserved for concepts with insufficient information to code with codable children: Secondary | ICD-10-CM | POA: Diagnosis present

## 2013-09-21 DIAGNOSIS — N12 Tubulo-interstitial nephritis, not specified as acute or chronic: Principal | ICD-10-CM | POA: Diagnosis present

## 2013-09-21 DIAGNOSIS — M810 Age-related osteoporosis without current pathological fracture: Secondary | ICD-10-CM | POA: Diagnosis present

## 2013-09-21 DIAGNOSIS — I798 Other disorders of arteries, arterioles and capillaries in diseases classified elsewhere: Secondary | ICD-10-CM

## 2013-09-21 DIAGNOSIS — E876 Hypokalemia: Secondary | ICD-10-CM | POA: Diagnosis present

## 2013-09-21 DIAGNOSIS — G929 Unspecified toxic encephalopathy: Secondary | ICD-10-CM | POA: Diagnosis present

## 2013-09-21 DIAGNOSIS — F0390 Unspecified dementia without behavioral disturbance: Secondary | ICD-10-CM | POA: Diagnosis present

## 2013-09-21 DIAGNOSIS — G92 Toxic encephalopathy: Secondary | ICD-10-CM | POA: Diagnosis present

## 2013-09-21 DIAGNOSIS — K219 Gastro-esophageal reflux disease without esophagitis: Secondary | ICD-10-CM | POA: Diagnosis present

## 2013-09-21 DIAGNOSIS — T68XXXA Hypothermia, initial encounter: Secondary | ICD-10-CM

## 2013-09-21 DIAGNOSIS — N179 Acute kidney failure, unspecified: Secondary | ICD-10-CM | POA: Diagnosis present

## 2013-09-21 DIAGNOSIS — E1169 Type 2 diabetes mellitus with other specified complication: Secondary | ICD-10-CM

## 2013-09-21 DIAGNOSIS — T83511A Infection and inflammatory reaction due to indwelling urethral catheter, initial encounter: Secondary | ICD-10-CM

## 2013-09-21 DIAGNOSIS — G9341 Metabolic encephalopathy: Secondary | ICD-10-CM | POA: Diagnosis present

## 2013-09-21 DIAGNOSIS — E1149 Type 2 diabetes mellitus with other diabetic neurological complication: Secondary | ICD-10-CM | POA: Diagnosis present

## 2013-09-21 DIAGNOSIS — S88919A Complete traumatic amputation of unspecified lower leg, level unspecified, initial encounter: Secondary | ICD-10-CM

## 2013-09-21 DIAGNOSIS — Z8673 Personal history of transient ischemic attack (TIA), and cerebral infarction without residual deficits: Secondary | ICD-10-CM

## 2013-09-21 DIAGNOSIS — Z7982 Long term (current) use of aspirin: Secondary | ICD-10-CM

## 2013-09-21 DIAGNOSIS — Z794 Long term (current) use of insulin: Secondary | ICD-10-CM

## 2013-09-21 DIAGNOSIS — E785 Hyperlipidemia, unspecified: Secondary | ICD-10-CM | POA: Diagnosis present

## 2013-09-21 DIAGNOSIS — E162 Hypoglycemia, unspecified: Secondary | ICD-10-CM

## 2013-09-21 DIAGNOSIS — I10 Essential (primary) hypertension: Secondary | ICD-10-CM

## 2013-09-21 DIAGNOSIS — N390 Urinary tract infection, site not specified: Secondary | ICD-10-CM

## 2013-09-21 DIAGNOSIS — K3184 Gastroparesis: Secondary | ICD-10-CM | POA: Diagnosis present

## 2013-09-21 LAB — CBC WITH DIFFERENTIAL/PLATELET
BASOS ABS: 0 10*3/uL (ref 0.0–0.1)
BASOS PCT: 0 % (ref 0–1)
EOS ABS: 0.2 10*3/uL (ref 0.0–0.7)
EOS PCT: 3 % (ref 0–5)
HCT: 38.8 % (ref 36.0–46.0)
Hemoglobin: 13 g/dL (ref 12.0–15.0)
LYMPHS PCT: 34 % (ref 12–46)
Lymphs Abs: 1.8 10*3/uL (ref 0.7–4.0)
MCH: 28.8 pg (ref 26.0–34.0)
MCHC: 33.5 g/dL (ref 30.0–36.0)
MCV: 86 fL (ref 78.0–100.0)
MONO ABS: 0.5 10*3/uL (ref 0.1–1.0)
Monocytes Relative: 9 % (ref 3–12)
Neutro Abs: 2.8 10*3/uL (ref 1.7–7.7)
Neutrophils Relative %: 54 % (ref 43–77)
PLATELETS: 289 10*3/uL (ref 150–400)
RBC: 4.51 MIL/uL (ref 3.87–5.11)
RDW: 13.4 % (ref 11.5–15.5)
WBC: 5.3 10*3/uL (ref 4.0–10.5)

## 2013-09-21 LAB — CBG MONITORING, ED
GLUCOSE-CAPILLARY: 204 mg/dL — AB (ref 70–99)
Glucose-Capillary: 68 mg/dL — ABNORMAL LOW (ref 70–99)
Glucose-Capillary: 345 mg/dL — ABNORMAL HIGH (ref 70–99)
Glucose-Capillary: 449 mg/dL — ABNORMAL HIGH (ref 70–99)

## 2013-09-21 LAB — BASIC METABOLIC PANEL
Anion gap: 13 (ref 5–15)
BUN: 39 mg/dL — ABNORMAL HIGH (ref 6–23)
CO2: 27 mEq/L (ref 19–32)
Calcium: 9.6 mg/dL (ref 8.4–10.5)
Chloride: 99 mEq/L (ref 96–112)
Creatinine, Ser: 1.05 mg/dL (ref 0.50–1.10)
GFR, EST AFRICAN AMERICAN: 58 mL/min — AB (ref >90)
GFR, EST NON AFRICAN AMERICAN: 50 mL/min — AB (ref >90)
Glucose, Bld: 63 mg/dL — ABNORMAL LOW (ref 70–99)
Potassium: 3.1 mEq/L — ABNORMAL LOW (ref 3.7–5.3)
SODIUM: 139 meq/L (ref 137–147)

## 2013-09-21 LAB — URINALYSIS, ROUTINE W REFLEX MICROSCOPIC
Bilirubin Urine: NEGATIVE
GLUCOSE, UA: NEGATIVE mg/dL
Ketones, ur: 15 mg/dL — AB
Nitrite: NEGATIVE
PH: 6 (ref 5.0–8.0)
Protein, ur: 100 mg/dL — AB
Specific Gravity, Urine: 1.02 (ref 1.005–1.030)
UROBILINOGEN UA: 1 mg/dL (ref 0.0–1.0)

## 2013-09-21 LAB — URINE MICROSCOPIC-ADD ON

## 2013-09-21 LAB — GLUCOSE, CAPILLARY
GLUCOSE-CAPILLARY: 167 mg/dL — AB (ref 70–99)
Glucose-Capillary: 377 mg/dL — ABNORMAL HIGH (ref 70–99)

## 2013-09-21 MED ORDER — PANTOPRAZOLE SODIUM 40 MG PO TBEC
40.0000 mg | DELAYED_RELEASE_TABLET | Freq: Every day | ORAL | Status: DC
  Administered 2013-09-21 – 2013-09-24 (×4): 40 mg via ORAL
  Filled 2013-09-21 (×4): qty 1

## 2013-09-21 MED ORDER — SIMVASTATIN 20 MG PO TABS: 20.0000 mg | ORAL_TABLET | Freq: Every day | ORAL | Status: DC

## 2013-09-21 MED ORDER — LISINOPRIL 10 MG PO TABS
20.0000 mg | ORAL_TABLET | Freq: Two times a day (BID) | ORAL | Status: DC
  Administered 2013-09-21 – 2013-09-22 (×2): 20 mg via ORAL
  Filled 2013-09-21 (×2): qty 2

## 2013-09-21 MED ORDER — POTASSIUM CHLORIDE CRYS ER 20 MEQ PO TBCR
40.0000 meq | EXTENDED_RELEASE_TABLET | Freq: Once | ORAL | Status: AC
  Administered 2013-09-21: 40 meq via ORAL
  Filled 2013-09-21: qty 2

## 2013-09-21 MED ORDER — LEVOFLOXACIN IN D5W 500 MG/100ML IV SOLN
500.0000 mg | INTRAVENOUS | Status: DC
  Administered 2013-09-21: 500 mg via INTRAVENOUS
  Filled 2013-09-21 (×3): qty 100

## 2013-09-21 MED ORDER — HYDROCHLOROTHIAZIDE 25 MG PO TABS
25.0000 mg | ORAL_TABLET | Freq: Two times a day (BID) | ORAL | Status: DC
  Administered 2013-09-21 – 2013-09-22 (×2): 25 mg via ORAL
  Filled 2013-09-21 (×2): qty 1

## 2013-09-21 MED ORDER — VITAMIN D 1000 UNITS PO TABS
1000.0000 [IU] | ORAL_TABLET | Freq: Every day | ORAL | Status: DC
Start: 2013-09-22 — End: 2013-09-22
  Administered 2013-09-22: 1000 [IU] via ORAL
  Filled 2013-09-21 (×4): qty 1

## 2013-09-21 MED ORDER — DEXTROSE 50 % IV SOLN
INTRAVENOUS | Status: AC
  Administered 2013-09-21: 50 mL
  Filled 2013-09-21: qty 50

## 2013-09-21 MED ORDER — ONDANSETRON HCL 4 MG/2ML IJ SOLN: 4.0000 mg | Freq: Four times a day (QID) | INTRAMUSCULAR | Status: DC | PRN

## 2013-09-21 MED ORDER — POTASSIUM CHLORIDE 2 MEQ/ML IV SOLN
INTRAVENOUS | Status: DC
  Administered 2013-09-21: via INTRAVENOUS
  Filled 2013-09-21 (×7): qty 1000

## 2013-09-21 MED ORDER — PRO-STAT SUGAR FREE PO LIQD
30.0000 mL | Freq: Two times a day (BID) | ORAL | Status: DC
  Administered 2013-09-21 – 2013-09-24 (×6): 30 mL via ORAL
  Filled 2013-09-21 (×6): qty 30

## 2013-09-21 MED ORDER — DEXTROSE-NACL 5-0.9 % IV SOLN: INTRAVENOUS | Status: DC

## 2013-09-21 MED ORDER — HEPARIN SODIUM (PORCINE) 5000 UNIT/ML IJ SOLN
5000.0000 [IU] | Freq: Three times a day (TID) | INTRAMUSCULAR | Status: DC
  Administered 2013-09-21 – 2013-09-24 (×8): 5000 [IU] via SUBCUTANEOUS
  Filled 2013-09-21 (×8): qty 1

## 2013-09-21 MED ORDER — SODIUM CHLORIDE 0.9 % IV SOLN: INTRAVENOUS | Status: DC

## 2013-09-21 MED ORDER — POTASSIUM CHLORIDE CRYS ER 20 MEQ PO TBCR
EXTENDED_RELEASE_TABLET | ORAL | Status: AC
  Filled 2013-09-21: qty 1

## 2013-09-21 MED ORDER — INSULIN GLARGINE 100 UNIT/ML ~~LOC~~ SOLN
10.0000 [IU] | Freq: Every day | SUBCUTANEOUS | Status: DC
  Administered 2013-09-21 – 2013-09-22 (×2): 10 [IU] via SUBCUTANEOUS
  Filled 2013-09-21 (×3): qty 0.1

## 2013-09-21 MED ORDER — POTASSIUM CHLORIDE 2 MEQ/ML IV SOLN: INTRAVENOUS | Status: DC

## 2013-09-21 MED ORDER — INSULIN ASPART 100 UNIT/ML ~~LOC~~ SOLN: 0.0000 [IU] | Freq: Every day | SUBCUTANEOUS | Status: DC

## 2013-09-21 MED ORDER — INSULIN ASPART 100 UNIT/ML ~~LOC~~ SOLN
0.0000 [IU] | Freq: Three times a day (TID) | SUBCUTANEOUS | Status: DC
  Administered 2013-09-21: 15 [IU] via SUBCUTANEOUS
  Administered 2013-09-22: 5 [IU] via SUBCUTANEOUS
  Administered 2013-09-22: 3 [IU] via SUBCUTANEOUS
  Filled 2013-09-21: qty 1

## 2013-09-21 MED ORDER — DEXTROSE-NACL 5-0.9 % IV SOLN
INTRAVENOUS | Status: DC
  Administered 2013-09-21: 14:00:00 via INTRAVENOUS

## 2013-09-21 MED ORDER — ASPIRIN EC 81 MG PO TBEC
81.0000 mg | DELAYED_RELEASE_TABLET | Freq: Every day | ORAL | Status: DC
  Administered 2013-09-22 – 2013-09-24 (×3): 81 mg via ORAL
  Filled 2013-09-21 (×3): qty 1

## 2013-09-21 MED ORDER — ONDANSETRON HCL 4 MG PO TABS: 4.0000 mg | ORAL_TABLET | Freq: Four times a day (QID) | ORAL | Status: DC | PRN

## 2013-09-21 MED ORDER — LISINOPRIL-HYDROCHLOROTHIAZIDE 20-25 MG PO TABS: 1.0000 | ORAL_TABLET | Freq: Two times a day (BID) | ORAL | Status: DC

## 2013-09-21 NOTE — ED Provider Notes (Signed)
CSN: [id number]     Arrival date & time 09/21/13  1339 History  This chart was scribed for Ward Givens, MD by Leone Payor, ED Scribe. This patient was seen in room APA02/APA02 and the patient's care was started 1:54 PM.    Chief Complaint  Patient presents with  . Hypoglycemia    The history is provided by the patient and a relative. The history is limited by the condition of the patient. No language interpreter was used.   LEVEL 5 CAVEAT-Altered Mental Status HPI Comments: [name] is a 80 y.o. female with past medical history of HTN, CVA, DM brought in by ambulance, who presents to the Emergency Department complaining of an episode of unresponsiveness PTA. Per daughter, patient was at lunch when she slumped over and became unresponsive. Patient is a diabetic and her blood glucose suddenly dropped to 40. She was given a half of a tube of glucose and glucagon at [facility] prior to EMS arrival. She was given a 2nd dose of glucagon by EMS before arriving to the ED. Per daughter, patient normally eats well but recently has noticed left over food on food tray. Patient has been a resident of [facility] since February 2015. Per daughter, patient recently had a UTI. When asked, patient denies any pain currently.   PCP Dr. Selena Batten   Past Medical History  Diagnosis Date  . HTN (hypertension)   . Stroke   . Diabetes mellitus without complication    Past Surgical History  Procedure Laterality Date  . Below knee leg amputation      left  . 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 Lump removed      from left breast  . S/p hysterectomy    . Cholecystectomy    . Esophagogastroduodenoscopy  09/23/08    SLF:no barrett's ring ,mass or stricture   History reviewed. No pertinent family history. History  Substance Use Topics  . Smoking status: Never Smoker   . Smokeless tobacco: Not on file  . Alcohol Use: No   Lives in NH since Feb  OB History   Grav Para Term Preterm  Abortions TAB SAB Ect Mult Living                 Review of Systems  Unable to perform ROS: Mental status change      Allergies  Penicillins  Home Medications   Prior to Admission medications   Medication Sig Start Date End Date Taking? Authorizing Provider  Amino Acids-Protein Hydrolys (FEEDING SUPPLEMENT, PRO-STAT SUGAR FREE 64,) LIQD Take 30 mLs by mouth 2 (two) times daily.   Yes Historical Provider, MD  aspirin EC 81 MG tablet Take 81 mg by mouth daily.   Yes Historical Provider, MD  cholecalciferol (VITAMIN D) 1000 UNITS tablet Take 1,000 Units by mouth daily.   Yes Historical Provider, MD  insulin aspart (NOVOLOG) 100 UNIT/ML injection Inject 3 Units into the skin 3 (three) times daily before meals.   Yes Historical Provider, MD  insulin aspart (NOVOLOG) 100 UNIT/ML injection Inject 2-8 Units into the skin 4 (four) times daily - after meals and at bedtime. Per sliding. 200-250= 2 units >70 call MD; 251-300 = 4 units; 301-350 = 6 units; 351-400 = 8 units; <400 call MD.   Yes Historical Provider, MD  insulin glargine (LANTUS) 100 UNIT/ML injection Inject 0.1 mLs (10 Units total) into the skin daily. Start 2/11 AM 04/24/13  Yes  Standley Brookinganiel P Goodrich, MD  lisinopril-hydrochlorothiazide (PRINZIDE,ZESTORETIC) 20-25 MG per tablet Take 1 tablet by mouth 2 (two) times daily.   Yes Historical Provider, MD  lovastatin (MEVACOR) 40 MG tablet Take 40 mg by mouth at bedtime.   Yes Historical Provider, MD  omeprazole (PRILOSEC) 20 MG capsule Take 20 mg by mouth daily. 05/26/12  Yes Joselyn ArrowKandice L Jones, NP   BP 103/43  Pulse 65  Temp(Src) 94 F (34.4 C) (Rectal)  Resp 16  SpO2 99%  Vital signs normal except hypothermia  Physical Exam  Nursing note and vitals reviewed. Constitutional: She is oriented to person, place, and time. She appears well-developed and well-nourished.  Non-toxic appearance. She does not appear ill. No distress.  Pt verbalizes "I feel fine"  HENT:  Head: Normocephalic and  atraumatic.  Right Ear: External ear normal.  Left Ear: External ear normal.  Nose: Nose normal. No mucosal edema or rhinorrhea.  Mouth/Throat: Oropharynx is clear and moist and mucous membranes are normal. No dental abscesses or uvula swelling.  Eyes: Conjunctivae and EOM are normal. Pupils are equal, round, and reactive to light.  Neck: Normal range of motion and full passive range of motion without pain. Neck supple.  Cardiovascular: Normal rate, regular rhythm and normal heart sounds.  Exam reveals no gallop and no friction rub.   No murmur heard. Pulmonary/Chest: Effort normal and breath sounds normal. No respiratory distress. She has no wheezes. She has no rhonchi. She has no rales. She exhibits no tenderness and no crepitus.  Abdominal: Soft. Normal appearance and bowel sounds are normal. She exhibits no distension. There is no tenderness. There is no rebound and no guarding.  Genitourinary:  Pt arrived with a Foley catheter  Musculoskeletal: Normal range of motion. She exhibits no edema and no tenderness.  Bilateral BKA with well healed stumps  Neurological: She is alert and oriented to person, place, and time. She has normal strength. No cranial nerve deficit.  Moves all extremities well. Follows directions.   Skin: Skin is dry and intact. No rash noted. No erythema. No pallor.  Cold to touch.   Psychiatric: She has a normal mood and affect. Her speech is normal. She is slowed.  Appears sleepy    ED Course  Procedures (including critical care time)  Medications  dextrose 5 %-0.9 % sodium chloride infusion ( Intravenous Stopped 09/21/13 1500)  levofloxacin (LEVAQUIN) IVPB 500 mg (500 mg Intravenous New Bag/Given 09/21/13 1554)  potassium chloride SA (K-DUR,KLOR-CON) CR tablet 40 mEq (40 mEq Oral Given 09/21/13 1554)     DIAGNOSTIC STUDIES: Oxygen Saturation is 99% on RA, normal by my interpretation.    COORDINATION OF CARE: 2:03 PM Discussed treatment plan with family at  bedside and they agreed to plan.  She started on a D5 normal saline drip at 100 cc an hour. She had a baer hugger placed for her hypothermia. She was given a soft diet to eat. She was started on oral potassium for her hypokalemia. She was started on levaquin for a UTI.    15:57 Dr Karilyn CotaGosrani  Admit to med-surg, team 1     Labs Review Results for orders placed during the hospital encounter of 09/21/13  CBC WITH DIFFERENTIAL      Result Value Ref Range   WBC 5.3  4.0 - 10.5 K/uL   RBC 4.51  3.87 - 5.11 MIL/uL   Hemoglobin 13.0  12.0 - 15.0 g/dL   HCT 09.838.8  11.936.0 - 14.746.0 %   MCV 86.0  78.0 - 100.0 fL   MCH 28.8  26.0 - 34.0 pg   MCHC 33.5  30.0 - 36.0 g/dL   RDW 16.1  09.6 - 04.5 %   Platelets 289  150 - 400 K/uL   Neutrophils Relative % 54  43 - 77 %   Neutro Abs 2.8  1.7 - 7.7 K/uL   Lymphocytes Relative 34  12 - 46 %   Lymphs Abs 1.8  0.7 - 4.0 K/uL   Monocytes Relative 9  3 - 12 %   Monocytes Absolute 0.5  0.1 - 1.0 K/uL   Eosinophils Relative 3  0 - 5 %   Eosinophils Absolute 0.2  0.0 - 0.7 K/uL   Basophils Relative 0  0 - 1 %   Basophils Absolute 0.0  0.0 - 0.1 K/uL  BASIC METABOLIC PANEL      Result Value Ref Range   Sodium 139  137 - 147 mEq/L   Potassium 3.1 (*) 3.7 - 5.3 mEq/L   Chloride 99  96 - 112 mEq/L   CO2 27  19 - 32 mEq/L   Glucose, Bld 63 (*) 70 - 99 mg/dL   BUN 39 (*) 6 - 23 mg/dL   Creatinine, Ser 4.09  0.50 - 1.10 mg/dL   Calcium 9.6  8.4 - 81.1 mg/dL   GFR calc non Af Amer 50 (*) >90 mL/min   GFR calc Af Amer 58 (*) >90 mL/min   Anion gap 13  5 - 15  URINALYSIS, ROUTINE W REFLEX MICROSCOPIC      Result Value Ref Range   Color, Urine YELLOW  YELLOW   APPearance CLOUDY (*) CLEAR   Specific Gravity, Urine 1.020  1.005 - 1.030   pH 6.0  5.0 - 8.0   Glucose, UA NEGATIVE  NEGATIVE mg/dL   Hgb urine dipstick MODERATE (*) NEGATIVE   Bilirubin Urine NEGATIVE  NEGATIVE   Ketones, ur 15 (*) NEGATIVE mg/dL   Protein, ur 914 (*) NEGATIVE mg/dL   Urobilinogen,  UA 1.0  0.0 - 1.0 mg/dL   Nitrite NEGATIVE  NEGATIVE   Leukocytes, UA MODERATE (*) NEGATIVE  URINE MICROSCOPIC-ADD ON      Result Value Ref Range   Squamous Epithelial / LPF RARE  RARE   WBC, UA TOO NUMEROUS TO COUNT  <3 WBC/hpf   RBC / HPF 7-10  <3 RBC/hpf   Bacteria, UA MANY (*) RARE  CBG MONITORING, ED      Result Value Ref Range   Glucose-Capillary 68 (*) 70 - 99 mg/dL  CBG MONITORING, ED      Result Value Ref Range   Glucose-Capillary 204 (*) 70 - 99 mg/dL   Laboratory interpretation all normal except UTI, hypoglycemia, hypokalemia    Imaging Review No results found.   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 The recorded information  has been reviewed and considered.  Devoria Albe, MD, Armando Gang   Ward Givens, MD 09/21/13 2056

## 2013-09-21 NOTE — H&P (Signed)
[facility] History and Physical  [name] ZOX:[id number] DOB: 06-26-1933 DOA: 09/21/2013  Referring physician: ER. PCP: Catalina Pizza, MD   Chief Complaint: Unresponsiveness.  HPI: [name] is a 80 y.o. female  This is a 80 year old lady who lives in a skilled nursing facility, [facility] , who has diabetes, history of CVA and is a bilateral amputee, who presents with episode of unresponsiveness. The blood glucose was 40 with this episode and the patient was given glucagon at the nursing home. She had a second dose of glucagon by EMS before arriving to the emergency room. The patient has been a resident of the nursing home since February 2015. When she presented to the emergency room, she was noted to be hypothermic. She was given further intravenous glucose and her blood glucose is acceptable. She is now being admitted to his urinalysis is consistent with UTI as well as her presentation of hypoglycemia and hypothermia. This patient herself is somewhat drowsy and unable to give me a clear history.   Review of Systems:  Unable to give a history secondary to drowsiness as above.   Past Medical History  Diagnosis Date  . HTN (hypertension)   . Stroke   . Diabetes mellitus without complication    Past Surgical History  Procedure Laterality Date  . Below knee leg amputation      left  . 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 Lump removed      from left breast  . S/p hysterectomy    . Cholecystectomy    . Esophagogastroduodenoscopy  09/23/08    SLF:no barrett's ring ,mass or stricture   Social History:  reports that she has never smoked. She does not have any smokeless tobacco history on file. She reports that she does not drink alcohol or use illicit drugs.  Allergies  Allergen Reactions  . Penicillins     REACTION: unknown reaction    History reviewed. No pertinent family history.   Prior to Admission medications   Medication Sig Start  Date End Date Taking? Authorizing Provider  Amino Acids-Protein Hydrolys (FEEDING SUPPLEMENT, PRO-STAT SUGAR FREE 64,) LIQD Take 30 mLs by mouth 2 (two) times daily.   Yes Historical Provider, MD  aspirin EC 81 MG tablet Take 81 mg by mouth daily.   Yes Historical Provider, MD  cholecalciferol (VITAMIN D) 1000 UNITS tablet Take 1,000 Units by mouth daily.   Yes Historical Provider, MD  insulin aspart (NOVOLOG) 100 UNIT/ML injection Inject 3 Units into the skin 3 (three) times daily before meals.   Yes Historical Provider, MD  insulin aspart (NOVOLOG) 100 UNIT/ML injection Inject 2-8 Units into the skin 4 (four) times daily - after meals and at bedtime. Per sliding. 200-250= 2 units >70 call MD; 251-300 = 4 units; 301-350 = 6 units; 351-400 = 8 units; <400 call MD.   Yes Historical Provider, MD  insulin glargine (LANTUS) 100 UNIT/ML injection Inject 0.1 mLs (10 Units total) into the skin daily. Start 2/11 AM 04/24/13  Yes Standley Brooking, MD  lisinopril-hydrochlorothiazide (PRINZIDE,ZESTORETIC) 20-25 MG per tablet Take 1 tablet by mouth 2 (two) times daily.   Yes Historical Provider, MD  lovastatin (MEVACOR) 40 MG tablet Take 40 mg by mouth at bedtime.   Yes Historical Provider, MD  omeprazole (PRILOSEC) 20 MG capsule Take 20 mg by mouth daily. 05/26/12  Yes Joselyn Arrow, NP   Physical Exam: Filed Vitals:   09/21/13  1630  BP:   Pulse:   Temp: 94.9 F (34.9 C)  Resp:     BP 130/55  Pulse 66  Temp(Src) 94.9 F (34.9 C) (Rectal)  Resp 19  SpO2 92%  General:  Appears drowsy. Hypothermic. Eyes: PERRL, normal lids, irises & conjunctiva ENT: grossly normal hearing, lips & tongue Neck: no LAD, masses or thyromegaly Cardiovascular: RRR, no m/r/g. No LE edema. Telemetry: SR, no arrhythmias  Respiratory: CTA bilaterally, no w/r/r. Normal respiratory effort. Abdomen: soft, ntnd Skin: no rash or induration seen on limited exam Musculoskeletal: grossly normal tone BUE/BLE Psychiatric: Not  examined. Neurologic: grossly non-focal.          Labs on Admission:  Basic Metabolic Panel:  Recent Labs Lab 09/21/13 1407  NA 139  K 3.1*  CL 99  CO2 27  GLUCOSE 63*  BUN 39*  CREATININE 1.05  CALCIUM 9.6   Liver Function Tests: No results found for this basename: AST, ALT, ALKPHOS, BILITOT, PROT, ALBUMIN,  in the last 168 hours No results found for this basename: LIPASE, AMYLASE,  in the last 168 hours No results found for this basename: AMMONIA,  in the last 168 hours CBC:  Recent Labs Lab 09/21/13 1407  WBC 5.3  NEUTROABS 2.8  HGB 13.0  HCT 38.8  MCV 86.0  PLT 289   Cardiac Enzymes: No results found for this basename: CKTOTAL, CKMB, CKMBINDEX, TROPONINI,  in the last 168 hours  BNP (last 3 results)  Recent Labs  04/21/13 1819  PROBNP 584.8*   CBG:  Recent Labs Lab 09/21/13 1350 09/21/13 1446 09/21/13 1600  GLUCAP 68* 204* 345*    Radiological Exams on Admission: No results found.    Assessment/Plan   1. UTI. 2. Hypothermia possibly secondary to #1. 3. Hypoglycemia, possibly secondary to #1. 4. Type 2 diabetes mellitus. 5. Hypertension. 6. Status post bilateral leg amputations. 7. Previous history of stroke.  Plan: 1. Admit to medical floor. 2. Intravenous antibiotics. 3. Intravenous fluids with dextrose. 4. Monitor temperature closely and continue with bear hugger until temperature normalizes.  Other recommendations will depend on patient's hospital progress.   Code Status: Full code.  Family Communication: I discussed the plan briefly with the patient when she was awake enough.   Disposition Plan: Back to the skilled nursing facility when medically stable.  Time spent: 60 minutes.  Wilson SingerGOSRANI,Jodilyn Giese C [facility] Pager [phone].  **Disclaimer: This note may have been dictated with voice recognition software. Similar sounding words can inadvertently be transcribed and this note may contain transcription errors which  may not have been corrected upon publication of note.**

## 2013-09-21 NOTE — ED Notes (Signed)
Pt diabetic and had sudden drop to 40. Pt from [facility]. Was given half tube of oral glucose and was given glucagon at [facility] prior to ems arrival. EMS initial cbg 51. Was given 2nd glucagon in route and now is 65. Pt lethargic. Employee from [facility] states this is patients baseline but "a little sleepy".

## 2013-09-21 NOTE — ED Notes (Signed)
Helped [name] clean up Ms. Larita FifeLynn.

## 2013-09-21 NOTE — ED Notes (Signed)
Pt wrapped in several warm blankets. Socks to amputated areas. EDP aware or temp

## 2013-09-21 NOTE — ED Notes (Signed)
Pt eating soft diet at this time. Daughter at bedside.

## 2013-09-21 NOTE — ED Notes (Signed)
Daughter now at bedside and states pt has been telling her that she has had urges of urination x 1 week now. Per EMS, urine very odorous.

## 2013-09-21 NOTE — Progress Notes (Signed)
Pt temp 98 degrees rectally. Warming blanket removed at this time. Will continue to monitor.

## 2013-09-21 NOTE — ED Notes (Signed)
Daughter, Pam , [phone]

## 2013-09-21 NOTE — ED Notes (Addendum)
AC aware needing Lantus. And 3A needing warming blanket. Per dr Karilyn Cotagosrani, keep pt on blanket warmer until reaches 98.0 rectal.

## 2013-09-22 LAB — GLUCOSE, CAPILLARY
GLUCOSE-CAPILLARY: 191 mg/dL — AB (ref 70–99)
GLUCOSE-CAPILLARY: 28 mg/dL — AB (ref 70–99)
GLUCOSE-CAPILLARY: 77 mg/dL (ref 70–99)
Glucose-Capillary: 112 mg/dL — ABNORMAL HIGH (ref 70–99)
Glucose-Capillary: 183 mg/dL — ABNORMAL HIGH (ref 70–99)
Glucose-Capillary: 233 mg/dL — ABNORMAL HIGH (ref 70–99)
Glucose-Capillary: 47 mg/dL — ABNORMAL LOW (ref 70–99)
Glucose-Capillary: 51 mg/dL — ABNORMAL LOW (ref 70–99)
Glucose-Capillary: 311 mg/dL — ABNORMAL HIGH (ref 70–99)

## 2013-09-22 LAB — COMPREHENSIVE METABOLIC PANEL
ALT: <5 U/L (ref 0–35)
AST: 15 U/L (ref 0–37)
Albumin: 2.8 g/dL — ABNORMAL LOW (ref 3.5–5.2)
Alkaline Phosphatase: 83 U/L (ref 39–117)
Anion gap: 11 (ref 5–15)
BUN: 36 mg/dL — AB (ref 6–23)
CO2: 26 meq/L (ref 19–32)
Calcium: 9.1 mg/dL (ref 8.4–10.5)
Chloride: 100 mEq/L (ref 96–112)
Creatinine, Ser: 0.86 mg/dL (ref 0.50–1.10)
GFR calc Af Amer: 74 mL/min — ABNORMAL LOW (ref >90)
GFR calc non Af Amer: 64 mL/min — ABNORMAL LOW (ref >90)
GLUCOSE: 186 mg/dL — AB (ref 70–99)
POTASSIUM: 4 meq/L (ref 3.7–5.3)
Sodium: 137 mEq/L (ref 137–147)
TOTAL PROTEIN: 6.7 g/dL (ref 6.0–8.3)
Total Bilirubin: 0.8 mg/dL (ref 0.3–1.2)

## 2013-09-22 LAB — CBC
HEMATOCRIT: 36.2 % (ref 36.0–46.0)
Hemoglobin: 12.2 g/dL (ref 12.0–15.0)
MCH: 28.7 pg (ref 26.0–34.0)
MCHC: 33.7 g/dL (ref 30.0–36.0)
MCV: 85.2 fL (ref 78.0–100.0)
Platelets: 286 10*3/uL (ref 150–400)
RBC: 4.25 MIL/uL (ref 3.87–5.11)
RDW: 13.3 % (ref 11.5–15.5)
WBC: 8.1 10*3/uL (ref 4.0–10.5)

## 2013-09-22 LAB — MRSA PCR SCREENING

## 2013-09-22 MED ORDER — MUPIROCIN 2 % EX OINT
1.0000 "application " | TOPICAL_OINTMENT | Freq: Two times a day (BID) | CUTANEOUS | Status: DC
  Administered 2013-09-22 – 2013-09-24 (×5): 1 via NASAL
  Filled 2013-09-22 (×2): qty 22

## 2013-09-22 MED ORDER — POTASSIUM CHLORIDE CRYS ER 20 MEQ PO TBCR
40.0000 meq | EXTENDED_RELEASE_TABLET | Freq: Once | ORAL | Status: AC
  Administered 2013-09-22: 40 meq via ORAL
  Filled 2013-09-22: qty 2

## 2013-09-22 MED ORDER — CHLORHEXIDINE GLUCONATE CLOTH 2 % EX PADS
6.0000 | MEDICATED_PAD | Freq: Every day | CUTANEOUS | Status: DC
  Administered 2013-09-22 – 2013-09-24 (×3): 6 via TOPICAL

## 2013-09-22 MED ORDER — LISINOPRIL 10 MG PO TABS
40.0000 mg | ORAL_TABLET | Freq: Every day | ORAL | Status: DC
  Administered 2013-09-22 – 2013-09-24 (×3): 40 mg via ORAL
  Filled 2013-09-22 (×4): qty 4

## 2013-09-22 MED ORDER — KCL IN DEXTROSE-NACL 20-5-0.9 MEQ/L-%-% IV SOLN: INTRAVENOUS | Status: DC

## 2013-09-22 MED ORDER — LEVOFLOXACIN 500 MG PO TABS
500.0000 mg | ORAL_TABLET | Freq: Every day | ORAL | Status: DC
  Administered 2013-09-22 – 2013-09-24 (×3): 500 mg via ORAL
  Filled 2013-09-22 (×3): qty 1

## 2013-09-22 MED ORDER — MEMANTINE HCL 10 MG PO TABS
10.0000 mg | ORAL_TABLET | Freq: Every day | ORAL | Status: DC
  Administered 2013-09-22 – 2013-09-24 (×3): 10 mg via ORAL
  Filled 2013-09-22 (×3): qty 1

## 2013-09-22 MED ORDER — SODIUM CHLORIDE 0.9 % IV SOLN
INTRAVENOUS | Status: DC
  Administered 2013-09-22: 100 mL via INTRAVENOUS
  Administered 2013-09-23: 06:00:00 via INTRAVENOUS

## 2013-09-22 NOTE — Progress Notes (Signed)
CRITICAL VALUE ALERT  Critical value received: glucose   Date of notification:  09-22-13  Time of notification:  1617  Critical value read back:Yes.    Nurse who received alert:  Sophronia SimasMiranda Daksh Coates RN   MD notified (1st page):  Samtani   Time of first page:  1634  MD notified (2nd page):  Time of second page:  Responding MD:  Mahala MenghiniSamtani  Time MD responded:  [phone]

## 2013-09-22 NOTE — Progress Notes (Signed)
Note: This document was prepared with digital dictation and possible smart phrase technology. Any transcriptional errors that result from this process are unintentional.   [name] ZHY:[id number] DOB: 02/14/1934 DOA: 09/21/2013 PCP: Catalina PizzaHALL, ZACH, MD  Brief narrative:  80 y/o ? known Ty 2 DM[recent admission [facility] 04/24/13], DM complicated by Gastroparesis/Amputations L leg 01/2000 + 09/19/2012 R leg, CAD, s/p Fem-Pop bypass 09/02/10 after a occluded L fem-an Tib bypassgraft, CVA 06/04/2002 as well as 07/28/2010, s/p lumpectomy L breast admitted with unresponsiveness hypothermia and ? Pyelonephritis.  Was found to have cbg of 40, given glucagon x 2, half a tube of glucose and was admitted  Past medical history-As per Problem list Chart reviewed as below-   Consultants:  none  Procedures:   none  Antibiotics:  none   Subjective  More alert seems to be close to baseline. States that she is in [place] at her home. Doesn't know where she is right now Thinks that this is 1984 extent,  mentions the president is "some black man'    Objective    Interim History:  known   Telemetry: None    Objective: Filed Vitals:   09/21/13 1845 09/21/13 2200 09/21/13 2300 09/22/13 0647  BP:  179/67  158/78  Pulse:  88  77  Temp: 98 F (36.7 C) 97.9 F (36.6 C)  97.1 F (36.2 C)  TempSrc: Rectal Rectal  Oral  Resp:  16  20  Height:   5\' 4"  (1.626 m)   Weight:   53.842 kg (118 lb 11.2 oz)   SpO2:  100%  100%    Intake/Output Summary (Last 24 hours) at 09/22/13 0950 Last data filed at 09/22/13 0858  Gross per 24 hour  Intake   1085 ml  Output    750 ml  Net    335 ml    Exam:  General:  alert pleasant somewhat oriented only no apparent distress  Cardiovascular:  S1-S2 no murmur rub or gallop  Respiratory:  clear no added sound  Abdomen:  soft nontender nondistended Skin bilateral TKAs without evidence of any necrosis of stumps Neuro a little confused over present.  Moves all 4 limbs equally. Power 5/5. Otherwise grossly neurologically is intact and closer to her baseline  Data Reviewed: Basic Metabolic Panel:  Recent Labs Lab 09/21/13 1407 09/22/13 0550  NA 139 137  K 3.1* 4.0  CL 99 100  CO2 27 26  GLUCOSE 63* 186*  BUN 39* 36*  CREATININE 1.05 0.86  CALCIUM 9.6 9.1   Liver Function Tests:  Recent Labs Lab 09/22/13 0550  AST 15  ALT <5  ALKPHOS 83  BILITOT 0.8  PROT 6.7  ALBUMIN 2.8*   No results found for this basename: LIPASE, AMYLASE,  in the last 168 hours No results found for this basename: AMMONIA,  in the last 168 hours CBC:  Recent Labs Lab 09/21/13 1407 09/22/13 0550  WBC 5.3 8.1  NEUTROABS 2.8  --   HGB 13.0 12.2  HCT 38.8 36.2  MCV 86.0 85.2  PLT 289 286   Cardiac Enzymes: No results found for this basename: CKTOTAL, CKMB, CKMBINDEX, TROPONINI,  in the last 168 hours BNP: No components found with this basename: POCBNP,  CBG:  Recent Labs Lab 09/21/13 2017 09/21/13 2210 09/21/13 2225 09/22/13 0257 09/22/13 0721  GLUCAP 167* 28* 191* 112* 183*    Recent Results (from the past 240 hour(s))  MRSA PCR SCREENING     Status: Abnormal  Collection Time    09/22/13 12:30 AM      Result Value Ref Range Status   MRSA by PCR RESULT CALLED TO, READ BACK BY AND VERIFIED WITH: (*) NEGATIVE Final   Comment: FRINK,L @ 0358 ON 09/22/13 BY WOODIE,J                The GeneXpert MRSA Assay (FDA     approved for NASAL specimens     only), is one component of a     comprehensive MRSA colonization     surveillance program. It is not     intended to diagnose MRSA     infection nor to guide or     monitor treatment for     MRSA infections.     Studies:              All Imaging reviewed and is as per above notation   Scheduled Meds: . aspirin EC  81 mg Oral Daily  . Chlorhexidine Gluconate Cloth  6 each Topical Q0600  . cholecalciferol  1,000 Units Oral Daily  . feeding supplement (PRO-STAT SUGAR FREE 64)   30 mL Oral BID  . heparin  5,000 Units Subcutaneous 3 times per day  . lisinopril  20 mg Oral BID   And  . hydrochlorothiazide  25 mg Oral BID  . insulin aspart  0-15 Units Subcutaneous TID WC  . insulin aspart  0-5 Units Subcutaneous QHS  . insulin glargine  10 Units Subcutaneous Daily  . levofloxacin (LEVAQUIN) IV  500 mg Intravenous Q24H  . mupirocin ointment  1 application Nasal BID  . pantoprazole  40 mg Oral Daily  . simvastatin  20 mg Oral q1800   Continuous Infusions: . dextrose 5 % and 0.9 % NaCl with KCl 20 mEq/L 100 mL (09/22/13 0824)  . dextrose 5 % and 0.9% NaCl       Assessment/Plan:  1. Toxic metabolic encephalopathy  -multifactorial = pyelonephritis/sepsis  + hypoglycemia sugars.  Blood sugars now 100- 200 range  -Daughter states close to baseline but has underlying dementia and sometimes at night is confused.    2. Sepsis /pyelonephritis .   -Hypothermic/hypotensive on admission, now resolved and discontinued Bair hugger  -Awaiting urine culture performed 7/10 , Levaquin IV transitioned to Levaquin 500 by mouth daily   -CBC a.m.  3 . Brittle diabetes mellitus with hypoglycemia currently   -Blood sugars ranging between 183-233 . Continue sensitive sliding scale coverage   -Lantus = 10 units in   -Per modified diet order   4. Acute kidney injury  -Discontinued HCTZ 25 mg twice a day 7/11, continue lisinopril 40 daily  -Start IV saline 7/11 @ 50 cc per hour  -Repeat labs in a.m. 5. Hypokalemia  -Replacing potassium 40 mg daily 6. Moderate dementia  -We'll start Namenda 10 mg in hospital  -Will need to see if there is any benefit from this as an outpatient at nursing facility 7. Osteoporosis  -hold vitamin D for now 8. Hyperlipidemia  -Hold lovastatin for now 9. Gerd  -Continue Protonix 40 daily 10. DVT prophylaxis  -Continue Lovenox  Code Status:  full CODE STATUS  Family Communication:  discussed in detail with family at bedside  Disposition Plan:   inpatient    Pleas Koch, MD  [facility] Pager [phone] 09/22/2013, 9:50 AM    LOS: 1 day

## 2013-09-22 NOTE — Progress Notes (Signed)
Hypoglycemic Event  CBG: 28  Treatment: D50 IV 50 mL  Symptoms: Sweaty and Shaky  Follow-up CBG: Time: 2225 CBG Result: 191  Possible Reasons for Event: Unknown  Comments/MD notified: Gosrani notified; will start pt on normal saline with D5    Westley FootsFrink, Sarika Baldini P  Remember to initiate Hypoglycemia Order Set & complete

## 2013-09-22 NOTE — Progress Notes (Signed)
Utilization review Completed Telicia Hodgkiss RN BSN   

## 2013-09-23 LAB — URINE CULTURE
Colony Count: >=100000
Special Requests: NORMAL

## 2013-09-23 LAB — GLUCOSE, CAPILLARY
Glucose-Capillary: 154 mg/dL — ABNORMAL HIGH (ref 70–99)
Glucose-Capillary: 226 mg/dL — ABNORMAL HIGH (ref 70–99)
Glucose-Capillary: 181 mg/dL — ABNORMAL HIGH (ref 70–99)
Glucose-Capillary: 126 mg/dL — ABNORMAL HIGH (ref 70–99)

## 2013-09-23 LAB — CBC
HCT: 34.3 % — ABNORMAL LOW (ref 36.0–46.0)
Hemoglobin: 11.4 g/dL — ABNORMAL LOW (ref 12.0–15.0)
MCH: 28.8 pg (ref 26.0–34.0)
MCHC: 33.2 g/dL (ref 30.0–36.0)
MCV: 86.6 fL (ref 78.0–100.0)
PLATELETS: 269 10*3/uL (ref 150–400)
RBC: 3.96 MIL/uL (ref 3.87–5.11)
RDW: 12.9 % (ref 11.5–15.5)
WBC: 6.1 10*3/uL (ref 4.0–10.5)

## 2013-09-23 LAB — COMPREHENSIVE METABOLIC PANEL
ALK PHOS: 77 U/L (ref 39–117)
AST: 13 U/L (ref 0–37)
Albumin: 2.5 g/dL — ABNORMAL LOW (ref 3.5–5.2)
Anion gap: 9 (ref 5–15)
BUN: 27 mg/dL — ABNORMAL HIGH (ref 6–23)
CO2: 25 meq/L (ref 19–32)
Calcium: 8.4 mg/dL (ref 8.4–10.5)
Chloride: 105 mEq/L (ref 96–112)
Creatinine, Ser: 0.74 mg/dL (ref 0.50–1.10)
GFR, EST NON AFRICAN AMERICAN: 80 mL/min — AB (ref >90)
Glucose, Bld: 191 mg/dL — ABNORMAL HIGH (ref 70–99)
POTASSIUM: 3.9 meq/L (ref 3.7–5.3)
SODIUM: 139 meq/L (ref 137–147)
Total Bilirubin: 0.3 mg/dL (ref 0.3–1.2)
Total Protein: 6.2 g/dL (ref 6.0–8.3)

## 2013-09-23 MED ORDER — GLUCERNA SHAKE PO LIQD
237.0000 mL | Freq: Two times a day (BID) | ORAL | Status: DC
  Administered 2013-09-23 – 2013-09-24 (×2): 237 mL via ORAL

## 2013-09-23 MED ORDER — INSULIN DETEMIR 100 UNIT/ML ~~LOC~~ SOLN
5.0000 [IU] | Freq: Every day | SUBCUTANEOUS | Status: DC
  Administered 2013-09-23: 5 [IU] via SUBCUTANEOUS
  Filled 2013-09-23: qty 0.05

## 2013-09-23 MED ORDER — INSULIN ASPART 100 UNIT/ML ~~LOC~~ SOLN
0.0000 [IU] | Freq: Three times a day (TID) | SUBCUTANEOUS | Status: DC
  Administered 2013-09-23: 2 [IU] via SUBCUTANEOUS
  Administered 2013-09-24: 1 [IU] via SUBCUTANEOUS

## 2013-09-23 NOTE — Progress Notes (Signed)
Note: This document was prepared with digital dictation and possible smart phrase technology. Any transcriptional errors that result from this process are unintentional.   Sara GuarneriHelen W [name] [id number] DOB: 10-Aug-1933 DOA: 09/21/2013 PCP: Catalina PizzaHALL, ZACH, MD  Brief narrative:  80 y/o ? known Ty 2 DM[recent admission [facility] 04/24/13], DM complicated by Gastroparesis/Amputations L leg 01/2000 + 09/19/2012 R leg, CAD, s/p Fem-Pop bypass 09/02/10 after a occluded L fem-an Tib bypassgraft, CVA 06/04/2002 as well as 07/28/2010, s/p lumpectomy L breast admitted with unresponsiveness hypothermia and ? Pyelonephritis.  Was found to have cbg of 40, given glucagon x 2, half a tube of glucose and was admitted Her sugars are  difficult control with very brittle diabetes  Past medical history-As per Problem list Chart reviewed as below-   Consultants:  none  Procedures:   none  Antibiotics:  none   Subjective    In fair confusion at baseline Daughter states has tried Namenda in the past caused diarrhea. Blood sugars now more stable although episodes of hypoglycemia prompted discontinuation hypoglycemics     Objective    Interim History:  known   Telemetry: None    Objective: Filed Vitals:   09/22/13 0647 09/22/13 1300 09/22/13 2020 09/23/13 0434  BP: 158/78 118/34 157/86 176/53  Pulse: 77 75 96 76  Temp: 97.1 F (36.2 C) 99.2 F (37.3 C) 99.3 F (37.4 C) 98.2 F (36.8 C)  TempSrc: Oral Rectal Oral Oral  Resp: 20 20 20 20   Height:      Weight:      SpO2: 100% 100% 100% 100%    Intake/Output Summary (Last 24 hours) at 09/23/13 1417 Last data filed at 09/23/13 0800  Gross per 24 hour  Intake 1225.84 ml  Output   1000 ml  Net 225.84 ml    Exam:  General:  alert pleasant somewhat oriented only no apparent distress  Cardiovascular:  S1-S2 no murmur rub or gallop  Respiratory:  clear no added sound  Abdomen:  soft nontender nondistended Skin bilateral TKAs without evidence of  any necrosis of stumps  Data Reviewed: Basic Metabolic Panel:  Recent Labs Lab 09/21/13 1407 09/22/13 0550 09/23/13 0558  NA 139 137 139  K 3.1* 4.0 3.9  CL 99 100 105  CO2 27 26 25   GLUCOSE 63* 186* 191*  BUN 39* 36* 27*  CREATININE 1.05 0.86 0.74  CALCIUM 9.6 9.1 8.4   Liver Function Tests:  Recent Labs Lab 09/22/13 0550 09/23/13 0558  AST 15 13  ALT <5 <5  ALKPHOS 83 77  BILITOT 0.8 0.3  PROT 6.7 6.2  ALBUMIN 2.8* 2.5*   No results found for this basename: LIPASE, AMYLASE,  in the last 168 hours No results found for this basename: AMMONIA,  in the last 168 hours CBC:  Recent Labs Lab 09/21/13 1407 09/22/13 0550 09/23/13 0558  WBC 5.3 8.1 6.1  NEUTROABS 2.8  --   --   HGB 13.0 12.2 11.4*  HCT 38.8 36.2 34.3*  MCV 86.0 85.2 86.6  PLT 289 286 269   Cardiac Enzymes: No results found for this basename: CKTOTAL, CKMB, CKMBINDEX, TROPONINI,  in the last 168 hours BNP: No components found with this basename: POCBNP,  CBG:  Recent Labs Lab 09/22/13 1634 09/22/13 1700 09/22/13 2034 09/23/13 0747 09/23/13 1125  GLUCAP 51* 77 311* 154* 226*    Recent Results (from the past 240 hour(s))  MRSA PCR SCREENING     Status: Abnormal   Collection Time  09/22/13 12:30 AM      Result Value Ref Range Status   MRSA by PCR RESULT CALLED TO, READ BACK BY AND VERIFIED WITH: (*) NEGATIVE Final   Comment: FRINK,L @ 0358 ON 09/22/13 BY WOODIE,J                The GeneXpert MRSA Assay (FDA     approved for NASAL specimens     only), is one component of a     comprehensive MRSA colonization     surveillance program. It is not     intended to diagnose MRSA     infection nor to guide or     monitor treatment for     MRSA infections.     Studies:              All Imaging reviewed and is as per above notation   Scheduled Meds: . aspirin EC  81 mg Oral Daily  . Chlorhexidine Gluconate Cloth  6 each Topical Q0600  . feeding supplement (PRO-STAT SUGAR FREE 64)   30 mL Oral BID  . heparin  5,000 Units Subcutaneous 3 times per day  . levofloxacin  500 mg Oral Daily  . lisinopril  40 mg Oral Daily  . memantine  10 mg Oral Daily  . mupirocin ointment  1 application Nasal BID  . pantoprazole  40 mg Oral Daily   Continuous Infusions: . sodium chloride 50 mL/hr at 09/23/13 0612     Assessment/Plan:  1. Toxic metabolic encephalopathy  -multifactorial = pyelonephritis/sepsis  + hypoglycemia sugars.   -Daughter states close to baseline- has underlying dementia and sometimes at night is confused. See  below   2. Sepsis /pyelonephritis .   -Hypothermic/hypotensive on admission, now resolved and discontinued Bair hugger  -Awaiting urine culture performed 7/10 , Levaquin IV transitioned to Levaquin 500 by mouth daily   -CBC shows no white count therefore will not further check 3 . Brittle diabetes mellitus with hypoglycemia currently   -Blood sugars dropped 7/12. Restart SSI sensitive  -ranges 191-311  -decrease Lantus 10-->5 units qpm  -Carb modified diet order   4. Acute kidney injury  -Discontinued HCTZ 25 mg twice a day 7/11, continue lisinopril 40 daily  -continue IV saline 7/11 @ 50 cc per hour x 24 hours  -bun/creat  39/1.05---> 27/0.74 5. Hypokalemia  -Replacing potassium 40 mg daily 6. Moderate dementia  -We'll start Namenda 10 mg in hospital  -Will need to see if there is any benefit from this as an outpatient at nursing facility 7. Osteoporosis  -hold vitamin D for now 8. Hyperlipidemia  -Hold lovastatin for now 9. Gerd  -Continue Protonix 40 daily 10. DVT prophylaxis  -Continue Lovenox  Code Status:  full CODE STATUS  Family Communication:  Discussed with daughter at the bedside who understands Disposition Plan:  inpatient likely discharge in a.m. to [facility] if all stable   Pleas Koch, MD  [facility] Pager [phone] 09/23/2013, 2:17 PM    LOS: 2 days

## 2013-09-23 NOTE — Progress Notes (Signed)
INITIAL NUTRITION ASSESSMENT  DOCUMENTATION CODES Per approved criteria  -Not Applicable   INTERVENTION: Provide finger foods with each meal to encourage self feeding. Glucerna Shake po BID, each supplement provides 220 kcal and 10 grams of protein   NUTRITION DIAGNOSIS:  Inadequate oral intake related to decreased appetite as evidenced by pt hx (decreased po PTA).   Goal:Pt to meet >/= 90% of their estimated nutrition needs     Monitor: Po intake, labs and wt trends    Reason for Assessment: Malnutrition Screen Score =  2  80 y.o. female   ASSESSMENT: Pt is s/p bilateral BKA. Hx of brittle DM. Her daughter is present and reports pt appetite good until 2-3 days prior to admission. Pt is home diet is CHO Modified. She is able to feed herself finger foods at times but  needs assistance with meals to increase po intake. Pt unable to participate in  exam due to dementia. Mild  Wasting to eyes, temples and clavicle.   Height: Ht Readings from Last 1 Encounters:  09/21/13 5\' 4"  (1.626 m)    Weight: Wt Readings from Last 1 Encounters:  09/21/13 118 lb 11.2 oz (53.842 kg)    Adjusted Ideal Body Weight: 106# (48 kg)  % Adjusted Ideal Body Weight: 112%4  Wt Readings from Last 10 Encounters:  09/21/13 118 lb 11.2 oz (53.842 kg)  04/22/13 108 lb 0.4 oz (49 kg)  03/18/11 145 lb (65.772 kg)    Usual Body Weight: 110-115#  % Usual Body Weight: 108%  BMI:  Body mass index is 20.36 kg/(m^2).normal range  Estimated Nutritional Needs: Kcal: [id number] Protein: 65-75 gr Fluid: >1600 ml daily  Skin: intact  Diet Order: Carb Control  EDUCATION NEEDS: -No education needs identified at this time   Intake/Output Summary (Last 24 hours) at 09/23/13 1509 Last data filed at 09/23/13 1300  Gross per 24 hour  Intake 1225.84 ml  Output   1750 ml  Net -524.16 ml    Last BM: 09/22/13  Labs:   Recent Labs Lab 09/21/13 1407 09/22/13 0550 09/23/13 0558  NA 139 137 139   K 3.1* 4.0 3.9  CL 99 100 105  CO2 27 26 25   BUN 39* 36* 27*  CREATININE 1.05 0.86 0.74  CALCIUM 9.6 9.1 8.4  GLUCOSE 63* 186* 191*    CBG (last 3)   Recent Labs  09/22/13 2034 09/23/13 0747 09/23/13 1125  GLUCAP 311* 154* 226*    Scheduled Meds: . aspirin EC  81 mg Oral Daily  . Chlorhexidine Gluconate Cloth  6 each Topical Q0600  . feeding supplement (PRO-STAT SUGAR FREE 64)  30 mL Oral BID  . heparin  5,000 Units Subcutaneous 3 times per day  . insulin aspart  0-9 Units Subcutaneous TID WC  . insulin detemir  5 Units Subcutaneous QHS  . levofloxacin  500 mg Oral Daily  . lisinopril  40 mg Oral Daily  . memantine  10 mg Oral Daily  . mupirocin ointment  1 application Nasal BID  . pantoprazole  40 mg Oral Daily    Continuous Infusions: . sodium chloride 50 mL/hr at 09/23/13 [id number]    Past Medical History  Diagnosis Date  . HTN (hypertension)   . Stroke   . Diabetes mellitus without complication     Past Surgical History  Procedure Laterality Date  . Below knee leg amputation      left  . Below knee leg amputation  09/2002    right after  gangrene of foot  . Lump removed      from left breast  . S/p hysterectomy    . Cholecystectomy    . Esophagogastroduodenoscopy  09/23/08    SLF:no barrett's ring ,mass or stricture    Jestine Bicknell MS,RD,CSG,LDN Office: #[phone] Pager: [phone]

## 2013-09-24 DIAGNOSIS — G9341 Metabolic encephalopathy: Secondary | ICD-10-CM

## 2013-09-24 LAB — BASIC METABOLIC PANEL
ANION GAP: 11 (ref 5–15)
BUN: 23 mg/dL (ref 6–23)
CO2: 25 mEq/L (ref 19–32)
CREATININE: 0.67 mg/dL (ref 0.50–1.10)
Calcium: 9 mg/dL (ref 8.4–10.5)
Chloride: 104 mEq/L (ref 96–112)
GFR calc non Af Amer: 83 mL/min — ABNORMAL LOW (ref >90)
Glucose, Bld: 149 mg/dL — ABNORMAL HIGH (ref 70–99)
Potassium: 3.8 mEq/L (ref 3.7–5.3)
Sodium: 140 mEq/L (ref 137–147)

## 2013-09-24 LAB — CBC WITH DIFFERENTIAL/PLATELET
BASOS ABS: 0 10*3/uL (ref 0.0–0.1)
BASOS PCT: 1 % (ref 0–1)
EOS ABS: 0.2 10*3/uL (ref 0.0–0.7)
Eosinophils Relative: 3 % (ref 0–5)
HEMATOCRIT: 36.1 % (ref 36.0–46.0)
Hemoglobin: 12.3 g/dL (ref 12.0–15.0)
Lymphocytes Relative: 29 % (ref 12–46)
Lymphs Abs: 1.9 10*3/uL (ref 0.7–4.0)
MCH: 28.9 pg (ref 26.0–34.0)
MCHC: 34.1 g/dL (ref 30.0–36.0)
MCV: 84.7 fL (ref 78.0–100.0)
MONO ABS: 0.4 10*3/uL (ref 0.1–1.0)
MONOS PCT: 7 % (ref 3–12)
NEUTROS PCT: 62 % (ref 43–77)
Neutro Abs: 4 10*3/uL (ref 1.7–7.7)
Platelets: 287 10*3/uL (ref 150–400)
RBC: 4.26 MIL/uL (ref 3.87–5.11)
RDW: 13.4 % (ref 11.5–15.5)
WBC: 6.5 10*3/uL (ref 4.0–10.5)

## 2013-09-24 LAB — GLUCOSE, CAPILLARY: Glucose-Capillary: 126 mg/dL — ABNORMAL HIGH (ref 70–99)

## 2013-09-24 MED ORDER — LEVOFLOXACIN 500 MG PO TABS: 500.0000 mg | ORAL_TABLET | Freq: Every day | ORAL | Status: DC

## 2013-09-24 MED ORDER — INSULIN DETEMIR 100 UNIT/ML ~~LOC~~ SOLN: 5.0000 [IU] | Freq: Every day | SUBCUTANEOUS | Status: DC

## 2013-09-24 MED ORDER — AMLODIPINE BESYLATE 10 MG PO TABS: 10.0000 mg | ORAL_TABLET | Freq: Every day | ORAL | Status: DC

## 2013-09-24 MED ORDER — MEMANTINE HCL 10 MG PO TABS: 10.0000 mg | ORAL_TABLET | Freq: Every day | ORAL | Status: DC

## 2013-09-24 MED ORDER — LISINOPRIL 40 MG PO TABS: 40.0000 mg | ORAL_TABLET | Freq: Every day | ORAL | Status: DC

## 2013-09-24 NOTE — Care Management Note (Signed)
    Page 1 of 1   09/24/2013     9:28:25 AM CARE MANAGEMENT NOTE 09/24/2013  Patient:  [name],[name]   Account Number:  [id number]  Date Initiated:  09/24/2013  Documentation initiated by:  Sharrie RothmanBLACKWELL,Miakoda Mcmillion C  Subjective/Objective Assessment:   Pt admitted from [facility] with UTI. Pt to return to facility at discharge.     Action/Plan:   CSW to arrange discharge to facility when medically stable.   Anticipated DC Date:  09/24/2013   Anticipated DC Plan:  SKILLED NURSING FACILITY  In-house referral  Clinical Social Worker      DC Planning Services  CM consult      Choice offered to / List presented to:             Status of service:  Completed, signed off Medicare Important Message given?  YES (If response is "NO", the following Medicare IM given date fields will be blank) Date Medicare IM given:  09/24/2013 Medicare IM given by:  Sharrie RothmanBLACKWELL,Philbert Ocallaghan C Date Additional Medicare IM given:   Additional Medicare IM given by:    Discharge Disposition:  SKILLED NURSING FACILITY  Per UR Regulation:    If discussed at Long Length of Stay Meetings, dates discussed:    Comments:  09/24/13 0930 Arlyss Queenammy Sidharth Leverette, RN BSN CM

## 2013-09-24 NOTE — Progress Notes (Signed)
IV removed. Foley catheter changed out as per physician order. Report called to [facility] at [facility]. Patient ready for transport

## 2013-09-24 NOTE — Clinical Social Work Note (Signed)
[name] returned call and plans to set up transport with [facility]. RN notified.   Derenda FennelKara Katrinna Travieso, Kentucky[place]CS[place] [phone]

## 2013-09-24 NOTE — Clinical Social Work Psychosocial (Signed)
Clinical Social Work Department BRIEF PSYCHOSOCIAL ASSESSMENT 09/24/2013  Patient:  [name],[name]     Account Number:  [id number]     Admit date:  09/21/2013  Clinical Social Worker:  Nancie NeasSTULTZ,Rayn Shorb, LCSW  Date/Time:  09/24/2013 09:36 AM  Referred by:  CSW  Date Referred:  09/24/2013 Referred for  SNF Placement   Other Referral:   Interview type:  Family Other interview type:   Sara CloudPamela- daughter    PSYCHOSOCIAL DATA Living Status:  [name] Admitted from [name]:  [name] OF [place] Level of care:  Skilled Nursing [name] Primary support name:  [name] Primary support relationship to patient:  CHILD, ADULT Degree of support available:   very supportive    CURRENT CONCERNS Current Concerns  Post-Acute Placement   Other Concerns:    SOCIAL WORK ASSESSMENT / PLAN CSW spoke with pt's daughter, [name] on phone as pt is oriented to self only. Pt has been a resident at [facility] for about 6 months. [name] and her brother live locally and are very involved. [name] indicates things have been going well at [name] and they plan for her to be there long term. Pt is confused at baseline per daughter. [name] reports pt is wheelchair bound and nursing level of care. Okay to return. Pt d/c today. Pt's daughter and [name] aware and agreeable. D/C summary faxed. [name] requested to call CSW back with decision on transportation- either EMS or [facility].   Assessment/plan status:  [facility] Other assessment/ plan:   Information/referral to community resources:   [name]    PATIENT'S/FAMILY'S RESPONSE TO PLAN OF CARE: Pt unable to discuss plan of care due to confusion. [name] requests return to [name]. D/C today. No other needs reported.     Sara FennelKara Sharlett [name], [place] [phone]

## 2013-09-24 NOTE — Discharge Summary (Signed)
Physician Discharge Summary  [name]:[id number] DOB: 1933/08/12 DOA: 09/21/2013  PCP: Catalina Pizza, MD  Admit date: 09/21/2013 Discharge date: 09/24/2013  Time spent: 35 minutes  Recommendations for Outpatient Follow-up:   1. Complete 2 more days of Levaquin 500 on 7/15 2. Monitor blood sugars as well as amounts eaten carefully-she has a tendency not to eat and needs to be helped with food  3. Catheter was changed on 09/24/13 4. Get cbc and bmet as OP in 3-4 days 5. Consider increasing anti-htn regimen as OP with alternate agents-have d/c bid Lisinopril-HCTS 6. Consider d/c statin/Vit D as no mortality benefit   Discharge Diagnoses:  Principal Problem:   Encephalopathy, metabolic Toxic Active Problems:   HYPERTENSION   DM (diabetes mellitus), type 2, uncontrolled, periph vascular complic   UTI (urinary tract infection)   Hypothermia   UTI (lower urinary tract infection)   Discharge Condition: fair  Diet recommendation: diabetic  Filed Weights   09/21/13 2300  Weight: 53.842 kg (118 lb 11.2 oz)    History of present illness:  80 y/o ? known Ty 2 DM[recent admission [facility] 04/24/13], DM complicated by Gastroparesis/Amputations L leg 01/2000 + 09/19/2012 R leg, CAD, s/p Fem-Pop bypass 09/02/10 after a occluded L fem-an Tib bypassgraft, CVA 06/04/2002 as well as 07/28/2010, s/p lumpectomy L breast admitted with unresponsiveness hypothermia and ? Pyelonephritis. Was found to have cbg of 40, given glucagon x 2, half a tube of glucose and was admitted  Her sugars are difficult control with brittle diabetes   She is alert but was confused early this am and trying to get out of bed. Mrs. report catheter sleeping. Patient cannot give assistance at otherwise stable  Hospital Course:   1. Toxic metabolic encephalopathy  -secondary to profound hypoglycemia    -Daughter states close to baseline- has underlying dementia and sometimes at night is confused. See  below  2. Asymptomatic  bacteriuria  -Hypothermic/hypotensive on admission, now resolved and discontinued Bair hugger   -Urine culture shows 100,000 CFU multiple types 7/10 , so Levaquin IV transitioned to Levaquin 500 by  mouth daily on 09/23/13   -Patient will need finish course of Levaquin 09/26/48 regardless 3 . Brittle diabetes mellitus with episodic hypoglycemia while hospitalized    -Blood sugars dropped 7/12. Restart regimen used facility on discharge, might need to adjust to sensitive  for coverage  -ranges  126-226  -decrease Lantus 10-->5 units qpm   -Patient will need assistance with eating as well as finger foods 4. hypertension  -Only moderate control currently. Was on lisinopril-HCTZ twice a day on admission.  -Add amlodipine 10 mg on discharge  -Consider addition Coreg low-dose 2.5 twice a day he still elevated as an outpatient 5. Acute kidney injury   -Discontinued HCTZ 25 mg twice a day 7/11, continue lisinopril 40 daily   -Was on IV saline 7/11 @ 50 cc per hour which was d/c on 7/13  -bun/creat 39/1.05---> 27/0.74 6. Hypokalemia   -Replacing potassium 40 mg daily  7. Moderate dementia   -We'll start Namenda 10 mg in hospital   -Will need to see if there is any benefit from this as an outpatient at nursing facility  8. Osteoporosis   -hold vitamin D while in hospital- consider d/c of this as no mortalitly benefit 9. Hyperlipidemia   -Held lovastatin while in hospital- consider d/c of this as no mortality benefit 10. Gerd   -Continue Protonix 40 daily  11. DVT prophylaxis   -Continue Lovenox  Consultants:  none Procedures:  none Antibiotics:  Levaquin 7/10-->stop on 7/15  Discharge Exam: Filed Vitals:   09/23/13 2035  BP: 186/64  Pulse: 84  Temp: 99.4 F (37.4 C)  Resp: 20    General: alert pleasant oriented in nad Cardiovascular: s1 s 2 no m/r/g Respiratory: clear, no added sound  Discharge Instructions You were cared for by a hospitalist during your hospital stay.  If you have any questions about your discharge medications or the care you received while you were in the hospital after you are discharged, you can call the unit and asked to speak with the hospitalist on call if the hospitalist that took care of you is not available. Once you are discharged, your primary care physician will handle any further medical issues. Please note that NO REFILLS for any discharge medications will be authorized once you are discharged, as it is imperative that you return to your primary care physician (or establish a relationship with a primary care physician if you do not have one) for your aftercare needs so that they can reassess your need for medications and monitor your lab values.  Discharge Instructions   Diet - low sodium heart healthy    Complete by:  As directed      Increase activity slowly    Complete by:  As directed             Medication List    STOP taking these medications       insulin glargine 100 UNIT/ML injection  Commonly known as:  LANTUS     lisinopril-hydrochlorothiazide 20-25 MG per tablet  Commonly known as:  PRINZIDE,ZESTORETIC      TAKE these medications       aspirin EC 81 MG tablet  Take 81 mg by mouth daily.     cholecalciferol 1000 UNITS tablet  Commonly known as:  VITAMIN D  Take 1,000 Units by mouth daily.     feeding supplement (PRO-STAT SUGAR FREE 64) Liqd  Take 30 mLs by mouth 2 (two) times daily.     insulin aspart 100 UNIT/ML injection  Commonly known as:  novoLOG  Inject 2-8 Units into the skin 4 (four) times daily - after meals and at bedtime. Per sliding. 200-250= 2 units >70 call MD; 251-300 = 4 units; 301-350 = 6 units; 351-400 = 8 units; <400 call MD.     insulin detemir 100 UNIT/ML injection  Commonly known as:  LEVEMIR  Inject 0.05 mLs (5 Units total) into the skin at bedtime.     levofloxacin 500 MG tablet  Commonly known as:  LEVAQUIN  Take 1 tablet (500 mg total) by mouth daily.     lisinopril 40  MG tablet  Commonly known as:  PRINIVIL,ZESTRIL  Take 1 tablet (40 mg total) by mouth daily.     lovastatin 40 MG tablet  Commonly known as:  MEVACOR  Take 40 mg by mouth at bedtime.     memantine 10 MG tablet  Commonly known as:  NAMENDA  Take 1 tablet (10 mg total) by mouth daily.     omeprazole 20 MG capsule  Commonly known as:  PRILOSEC  Take 20 mg by mouth daily.       Allergies  Allergen Reactions  . Penicillins     REACTION: unknown reaction      The results of significant diagnostics from this hospitalization (including imaging, microbiology, ancillary and laboratory) are listed below for reference.    Significant Diagnostic Studies:  No results found.  Microbiology: Recent Results (from the past 240 hour(s))  URINE CULTURE     Status: None   Collection Time    09/21/13  2:52 PM      Result Value Ref Range Status   Specimen Description URINE, CLEAN CATCH   Final   Special Requests Normal   Final   Culture  Setup Time     Final   Value: 09/22/2013 22:58     Performed at [facility]   Colony Count     Final   Value: >=100,000 COLONIES/ML     Performed at [facility]   Culture     Final   Value: Multiple bacterial morphotypes present, none predominant. Suggest appropriate recollection if clinically indicated.     Performed at [facility]   Report Status 09/23/2013 FINAL   Final  MRSA PCR SCREENING     Status: Abnormal   Collection Time    09/22/13 12:30 AM      Result Value Ref Range Status   MRSA by PCR RESULT CALLED TO, READ BACK BY AND VERIFIED WITH: (*) NEGATIVE Final   Comment: FRINK,L @ 0358 ON 09/22/13 BY WOODIE,J                The GeneXpert MRSA Assay (FDA     approved for NASAL specimens     only), is one component of a     comprehensive MRSA colonization     surveillance program. It is not     intended to diagnose MRSA     infection nor to guide or     monitor treatment for     MRSA infections.      Labs: Basic Metabolic Panel:  Recent Labs Lab 09/21/13 1407 09/22/13 0550 09/23/13 0558  NA 139 137 139  K 3.1* 4.0 3.9  CL 99 100 105  CO2 27 26 25   GLUCOSE 63* 186* 191*  BUN 39* 36* 27*  CREATININE 1.05 0.86 0.74  CALCIUM 9.6 9.1 8.4   Liver Function Tests:  Recent Labs Lab 09/22/13 0550 09/23/13 0558  AST 15 13  ALT <5 <5  ALKPHOS 83 77  BILITOT 0.8 0.3  PROT 6.7 6.2  ALBUMIN 2.8* 2.5*   No results found for this basename: LIPASE, AMYLASE,  in the last 168 hours No results found for this basename: AMMONIA,  in the last 168 hours CBC:  Recent Labs Lab 09/21/13 1407 09/22/13 0550 09/23/13 0558  WBC 5.3 8.1 6.1  NEUTROABS 2.8  --   --   HGB 13.0 12.2 11.4*  HCT 38.8 36.2 34.3*  MCV 86.0 85.2 86.6  PLT 289 286 269   Cardiac Enzymes: No results found for this basename: CKTOTAL, CKMB, CKMBINDEX, TROPONINI,  in the last 168 hours BNP: BNP (last 3 results)  Recent Labs  04/21/13 1819  PROBNP 584.8*   CBG:  Recent Labs Lab 09/22/13 2034 09/23/13 0747 09/23/13 1125 09/23/13 1623 09/23/13 2034  GLUCAP 311* 154* 226* 181* 126*       Signed:  Rhetta MuraSAMTANI, JAI-GURMUKH  [facility] 09/24/2013, 8:12 AM

## 2014-01-11 ENCOUNTER — Emergency Department (HOSPITAL_COMMUNITY)
Admission: EM | Admit: 2014-01-11 | Discharge: 2014-01-12 | Disposition: A | Discharge status: Home / Self Care | Payer: Medicare Other | Attending: Emergency Medicine | Admitting: Emergency Medicine

## 2014-01-11 DIAGNOSIS — E119 Type 2 diabetes mellitus without complications: Secondary | ICD-10-CM | POA: Insufficient documentation

## 2014-01-11 DIAGNOSIS — I10 Essential (primary) hypertension: Secondary | ICD-10-CM | POA: Diagnosis not present

## 2014-01-11 DIAGNOSIS — R112 Nausea with vomiting, unspecified: Secondary | ICD-10-CM

## 2014-01-11 DIAGNOSIS — N390 Urinary tract infection, site not specified: Secondary | ICD-10-CM | POA: Insufficient documentation

## 2014-01-11 DIAGNOSIS — Z79899 Other long term (current) drug therapy: Secondary | ICD-10-CM | POA: Insufficient documentation

## 2014-01-11 DIAGNOSIS — Z880 Allergy status to penicillin: Secondary | ICD-10-CM | POA: Diagnosis not present

## 2014-01-11 DIAGNOSIS — Z8673 Personal history of transient ischemic attack (TIA), and cerebral infarction without residual deficits: Secondary | ICD-10-CM | POA: Diagnosis not present

## 2014-01-11 DIAGNOSIS — Z7982 Long term (current) use of aspirin: Secondary | ICD-10-CM | POA: Insufficient documentation

## 2014-01-11 DIAGNOSIS — Z794 Long term (current) use of insulin: Secondary | ICD-10-CM | POA: Insufficient documentation

## 2014-01-11 DIAGNOSIS — T8351XA Infection and inflammatory reaction due to indwelling urinary catheter, initial encounter: Secondary | ICD-10-CM | POA: Diagnosis not present

## 2014-01-11 DIAGNOSIS — T83511A Infection and inflammatory reaction due to indwelling urethral catheter, initial encounter: Secondary | ICD-10-CM

## 2014-01-11 LAB — URINALYSIS, ROUTINE W REFLEX MICROSCOPIC
BILIRUBIN URINE: NEGATIVE
Glucose, UA: 100 mg/dL — AB
Ketones, ur: NEGATIVE mg/dL
LEUKOCYTES UA: NEGATIVE
NITRITE: NEGATIVE
PH: 8 (ref 5.0–8.0)
Protein, ur: 100 mg/dL — AB
SPECIFIC GRAVITY, URINE: 1.025 (ref 1.005–1.030)
UROBILINOGEN UA: 0.2 mg/dL (ref 0.0–1.0)

## 2014-01-11 LAB — CBC WITH DIFFERENTIAL/PLATELET
BASOS PCT: 0 % (ref 0–1)
Basophils Absolute: 0 10*3/uL (ref 0.0–0.1)
EOS PCT: 0 % (ref 0–5)
Eosinophils Absolute: 0 10*3/uL (ref 0.0–0.7)
HCT: 39.1 % (ref 36.0–46.0)
HEMOGLOBIN: 13.3 g/dL (ref 12.0–15.0)
LYMPHS ABS: 0.8 10*3/uL (ref 0.7–4.0)
Lymphocytes Relative: 11 % — ABNORMAL LOW (ref 12–46)
MCH: 28.9 pg (ref 26.0–34.0)
MCHC: 34 g/dL (ref 30.0–36.0)
MCV: 84.8 fL (ref 78.0–100.0)
MONO ABS: 0.2 10*3/uL (ref 0.1–1.0)
MONOS PCT: 2 % — AB (ref 3–12)
NEUTROS PCT: 87 % — AB (ref 43–77)
Neutro Abs: 6 10*3/uL (ref 1.7–7.7)
Platelets: 296 10*3/uL (ref 150–400)
RBC: 4.61 MIL/uL (ref 3.87–5.11)
RDW: 12.8 % (ref 11.5–15.5)
WBC: 7 10*3/uL (ref 4.0–10.5)

## 2014-01-11 LAB — COMPREHENSIVE METABOLIC PANEL
ALBUMIN: 3.3 g/dL — AB (ref 3.5–5.2)
ALK PHOS: 119 U/L — AB (ref 39–117)
ALT: 8 U/L (ref 0–35)
AST: 18 U/L (ref 0–37)
Anion gap: 12 (ref 5–15)
BUN: 17 mg/dL (ref 6–23)
CALCIUM: 9 mg/dL (ref 8.4–10.5)
CO2: 30 mEq/L (ref 19–32)
CREATININE: 0.5 mg/dL (ref 0.50–1.10)
Chloride: 97 mEq/L (ref 96–112)
GFR calc non Af Amer: >90 mL/min (ref >90)
GLUCOSE: 119 mg/dL — AB (ref 70–99)
POTASSIUM: 3.2 meq/L — AB (ref 3.7–5.3)
Sodium: 139 mEq/L (ref 137–147)
TOTAL PROTEIN: 7.5 g/dL (ref 6.0–8.3)
Total Bilirubin: 0.7 mg/dL (ref 0.3–1.2)

## 2014-01-11 LAB — URINE MICROSCOPIC-ADD ON

## 2014-01-11 LAB — CBG MONITORING, ED: Glucose-Capillary: 103 mg/dL — ABNORMAL HIGH (ref 70–99)

## 2014-01-11 NOTE — ED Notes (Signed)
Vomiting since 6pm

## 2014-01-11 NOTE — ED Provider Notes (Signed)
CSN: [id number]     Arrival date & time 01/11/14  2139 History  This chart was scribed for Hanley SeamenJohn L Rett Stehlik, MD by Gwenyth Oberatherine Macek, ED Scribe. This patient was seen in room APA19/APA19 and the patient's care was started at 11:57 PM.     Chief Complaint  Patient presents with  . Vomiting    The history is provided by a relative. No language interpreter was used.   HPI Comments: Level 5 Caveat: somnolence. [name] is a 80 y.o. female with a history of HTN and DM brought in by her daughter who presents to the Emergency Department complaining of intermittent vomiting that occurred early in the day and reoccurred 6 hours ago. Pt has not vomited since arrival to the ED. Pt's daughter states that pt was complaining of lower abdominal pain and difficulty urinating yesterday. She also notes that pt was more confused than usual yesterday and saying things she would not normally say. Pt had history of CVA which left left her weak on right side, particularly in right hand. She denies fever and diarrhea as associated symptoms. The patient is somnolent which her daughter states is normal for this time of night   Past Medical History  Diagnosis Date  . HTN (hypertension)   . Stroke   . Diabetes mellitus without complication    Past Surgical History  Procedure Laterality Date  . Below knee leg amputation      left  . 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 Lump removed      from left breast  . S/p hysterectomy    . Cholecystectomy    . Esophagogastroduodenoscopy  09/23/08    SLF:no barrett's ring ,mass or stricture   No family history on file. History  Substance Use Topics  . Smoking status: Never Smoker   . Smokeless tobacco: Not on file  . Alcohol Use: No   OB History   Grav Para Term Preterm Abortions TAB SAB Ect Mult Living                 Review of Systems  Unable to perform ROS   A complete 10 system review of systems was obtained and all systems  are negative except as noted in the HPI and PMH.    Allergies  Penicillins  Home Medications   Prior to Admission medications   Medication Sig Start Date End Date Taking? Authorizing Provider  Amino Acids-Protein Hydrolys (FEEDING SUPPLEMENT, PRO-STAT SUGAR FREE 64,) LIQD Take 30 mLs by mouth 2 (two) times daily.   Yes Historical Provider, MD  aspirin EC 81 MG tablet Take 81 mg by mouth daily.   Yes Historical Provider, MD  cholecalciferol (VITAMIN D) 1000 UNITS tablet Take 2,000 Units by mouth daily.    Yes Historical Provider, MD  cloNIDine (CATAPRES) 0.1 MG tablet Take 0.1 mg by mouth every 6 (six) hours.   Yes Historical Provider, MD  insulin aspart (NOVOLOG) 100 UNIT/ML injection Inject 2-8 Units into the skin 4 (four) times daily - after meals and at bedtime. Per sliding. 200-250= 2 units >70 call MD; 251-300 = 4 units; 301-350 = 6 units; 351-400 = 8 units; <400 call MD.   Yes Historical Provider, MD  insulin detemir (LEVEMIR) 100 UNIT/ML injection Inject 0.05 mLs (5 Units total) into the skin at bedtime. 09/24/13  Yes Rhetta MuraJai-Gurmukh Samtani, MD  lisinopril (PRINIVIL,ZESTRIL) 20 MG tablet Take 20 mg by mouth daily.  Yes Historical Provider, MD  lovastatin (MEVACOR) 40 MG tablet Take 40 mg by mouth at bedtime.   Yes Historical Provider, MD  omeprazole (PRILOSEC) 20 MG capsule Take 20 mg by mouth daily. 05/26/12  Yes Joselyn Arrow, NP   BP 199/82  Pulse 76  Temp(Src) 98.4 F (36.9 C) (Oral)  Resp 20  Wt 100 lb (45.36 kg)  SpO2 100%  Physical Exam  Nursing note and vitals reviewed. General: Well-developed, well-nourished female in no acute distress; appearance consistent with age of record HENT: normocephalic; atraumatic Eyes: pupils equal, round and reactive to light; right ptosis and lateral strabismus; lens implant on the left  Neck: supple Heart: regular rate and rhythm; systolic murmur loudest at right upper sternal border Lungs: clear to auscultation bilaterally Abdomen:  soft; nondistended; nontender; no masses or hepatosplenomegaly; bowel sounds present GU: Indwelling foley  Extremities: Arthritic changes; bilateral BKA Neurologic: Sleeping but arousable; motor function intact in all extremities; no facial droop Skin: Warm and dry Psychiatric: Normal mood and affect   ED Course  Procedures (including critical care time)  DIAGNOSTIC STUDIES: Oxygen Saturation is 100% on RA, normal by my interpretation.    COORDINATION OF CARE: 11:51 PM Discussed treatment plan with pt at bedside and pt agreed to plan.  MDM   Results for orders placed during the hospital encounter of 01/11/14  CBC WITH DIFFERENTIAL      Result Value Ref Range   WBC 7.0  4.0 - 10.5 K/uL   RBC 4.61  3.87 - 5.11 MIL/uL   Hemoglobin 13.3  12.0 - 15.0 g/dL   HCT 62.9  52.8 - 41.3 %   MCV 84.8  78.0 - 100.0 fL   MCH 28.9  26.0 - 34.0 pg   MCHC 34.0  30.0 - 36.0 g/dL   RDW 24.4  01.0 - 27.2 %   Platelets 296  150 - 400 K/uL   Neutrophils Relative % 87 (*) 43 - 77 %   Neutro Abs 6.0  1.7 - 7.7 K/uL   Lymphocytes Relative 11 (*) 12 - 46 %   Lymphs Abs 0.8  0.7 - 4.0 K/uL   Monocytes Relative 2 (*) 3 - 12 %   Monocytes Absolute 0.2  0.1 - 1.0 K/uL   Eosinophils Relative 0  0 - 5 %   Eosinophils Absolute 0.0  0.0 - 0.7 K/uL   Basophils Relative 0  0 - 1 %   Basophils Absolute 0.0  0.0 - 0.1 K/uL  COMPREHENSIVE METABOLIC PANEL      Result Value Ref Range   Sodium 139  137 - 147 mEq/L   Potassium 3.2 (*) 3.7 - 5.3 mEq/L   Chloride 97  96 - 112 mEq/L   CO2 30  19 - 32 mEq/L   Glucose, Bld 119 (*) 70 - 99 mg/dL   BUN 17  6 - 23 mg/dL   Creatinine, Ser 5.36  0.50 - 1.10 mg/dL   Calcium 9.0  8.4 - 64.4 mg/dL   Total Protein 7.5  6.0 - 8.3 g/dL   Albumin 3.3 (*) 3.5 - 5.2 g/dL   AST 18  0 - 37 U/L   ALT 8  0 - 35 U/L   Alkaline Phosphatase 119 (*) 39 - 117 U/L   Total Bilirubin 0.7  0.3 - 1.2 mg/dL   GFR calc non Af Amer >90  >90 mL/min   GFR calc Af Amer >90  >90 mL/min    Anion gap 12  5 - 15  URINALYSIS, ROUTINE W REFLEX MICROSCOPIC      Result Value Ref Range   Color, Urine YELLOW  YELLOW   APPearance HAZY (*) CLEAR   Specific Gravity, Urine 1.025  1.005 - 1.030   pH 8.0  5.0 - 8.0   Glucose, UA 100 (*) NEGATIVE mg/dL   Hgb urine dipstick SMALL (*) NEGATIVE   Bilirubin Urine NEGATIVE  NEGATIVE   Ketones, ur NEGATIVE  NEGATIVE mg/dL   Protein, ur 161100 (*) NEGATIVE mg/dL   Urobilinogen, UA 0.2  0.0 - 1.0 mg/dL   Nitrite NEGATIVE  NEGATIVE   Leukocytes, UA NEGATIVE  NEGATIVE  URINE MICROSCOPIC-ADD ON      Result Value Ref Range   WBC, UA 7-10  <3 WBC/hpf   RBC / HPF 3-6  <3 RBC/hpf   Bacteria, UA MANY (*) RARE  CBG MONITORING, ED      Result Value Ref Range   Glucose-Capillary 103 (*) 70 - 99 mg/dL   0:961:00 AM Foley catheter changed. Rocephin 1 g IV given for likely urinary tract infection.  1:36 AM Drinking fluids without emesis.  I personally performed the services described in this documentation, which was scribed in my presence. The recorded information has been reviewed and is accurate.    Hanley SeamenJohn L Tremell Reimers, MD 01/12/14 [id number]

## 2014-01-11 NOTE — ED Notes (Signed)
Family here, daughter states pt was talking nonsense yesterday and c/o pain low pelvis. Now pt full alert, knows family and is answering questions appropriately. Denies pain anywhere. No vomiting noted since admission

## 2014-01-12 MED ORDER — PROMETHAZINE HCL 12.5 MG RE SUPP: 12.5000 mg | Freq: Four times a day (QID) | RECTAL | Status: DC | PRN

## 2014-01-12 MED ORDER — CIPROFLOXACIN HCL 500 MG PO TABS: 500.0000 mg | ORAL_TABLET | Freq: Two times a day (BID) | ORAL | Status: DC

## 2014-01-12 MED ORDER — DEXTROSE 5 % IV SOLN
1.0000 g | Freq: Once | INTRAVENOUS | Status: AC
  Administered 2014-01-12: 1 g via INTRAVENOUS
  Filled 2014-01-12: qty 10

## 2014-01-12 MED ORDER — SODIUM CHLORIDE 0.9 % IV BOLUS (SEPSIS)
500.0000 mL | Freq: Once | INTRAVENOUS | Status: AC
  Administered 2014-01-12: 500 mL via INTRAVENOUS

## 2014-01-12 NOTE — Discharge Instructions (Signed)
Catheter-Associated Urinary Tract Infection FAQs °WHAT IS "CATHETER-ASSOCIATED" URINARY TRACT INFECTION? °A urinary tract infection (also called "UTI") is an infection in the urinary system, which includes the bladder (which stores the urine) and the kidneys (which filter the blood to make urine). Germs (for example, bacteria or yeasts) do not normally live in these areas; but if germs are introduced, an infection can occur. If you have a urinary catheter, germs can travel along the catheter and cause an infection in your bladder or your kidney; in that case it is called a catheter-associated urinary tract infection (or "CA-UTI").  °WHAT IS A URINARY CATHETER? °A urinary catheter is a thin tube placed in the bladder to drain urine. Urine drains through the tube into a bag that collects the urine. A urinary  °catheter may be used: °· If you are not able to urinate on your own. °· To measure the amount of urine that you make, for example, during intensive care. °· During and after some types of surgery. °· During some tests of the kidneys and bladder . °People with urinary catheters have a much higher chance of getting a urinary tract infection than people who don't have a catheter. °HOW DO I GET A CATHETER-ASSOCIATED URINARY TRACT INFECTION (CA-UTI)? °If germs enter the urinary tract, they may cause an infection. Many of the germs that cause a catheter-associated urinary tract infection are common germs found in your intestines that do not usually cause an infection there. Germs can enter the urinary tract when the catheter is being put in or while the catheter remains in the bladder.  °WHAT ARE THE SYMPTOMS OF A URINARY TRACT INFECTION?  °Some of the common symptoms of a urinary tract infection are: °· Burning or pain in the lower abdomen (that is, below the stomach). °· Fever. °· Bloody urine may be a sign of infection, but is also caused by other problems . °· Burning during urination or an increase in the  frequency of urination after the catheter is removed. °Sometimes people with catheter-associated urinary tract infections do not have these symptoms of infection. °CAN CATHETER-ASSOCIATED URINARY TRACT INFECTIONS BE TREATED? °Yes, most catheter-associated urinary tract infections can be treated with antibiotics and removal or change of the catheter. Your doctor will determine which antibiotic is best for you.  °WHAT ARE SOME OF THE THINGS THAT HOSPITALS ARE DOING TO PREVENT CATHETER-ASSOCIATED URINARY TRACT INFECTIONS? °To prevent urinary tract infections, doctors and nurses take the following actions.  °Catheter insertion °· Catheters are put in only when necessary and they are removed as soon as possible. °· Only properly trained persons insert catheters using sterile ("clean") technique. °· The skin in the area where the catheter will be inserted is cleaned before inserting the catheter. °· Other methods to drain the urine are sometimes used, such as: °¨ External catheters in men (these look like condoms and are placed over the penis rather than into the penis) °¨ Putting a temporary catheter in to drain the urine and removing it right away. This is called intermittent urethral catheterization. °Catheter care °· Healthcare providers clean their hands by washing them with soap and water or using an alcohol-based hand rub before and after touching your catheter. °¨ If you do not see your providers clean their hands, please ask them to do so. °· Avoid disconnecting the catheter and drain tube. This helps to prevent germs from getting into the catheter tube. °· The catheter is secured to the leg to prevent pulling on the   catheter. °· Avoid twisting or kinking the catheter. °· Keep the bag lower than the bladder to prevent urine from backflowing to the bladder. °· Empty the bag regularly. The drainage spout should not touch anything while emptying the bag. °WHAT CAN I DO TO HELP PREVENT CATHETER-ASSOCIATED URINARY  TRACT INFECTIONS IF I HAVE A CATHETER? °· Always clean your hands before and after doing catheter care. °· Always keep your urine bag below the level of your bladder. °· Do not tug or pull on the tubing. °· Do not twist or kink the catheter tubing. °· Ask your healthcare provider each day if you still need the catheter. °WHAT DO I NEED TO DO WHEN I GO HOME FROM THE HOSPITAL? °· If you will be going home with a catheter, your doctor or nurse should explain everything you need to know about taking care of the catheter. Make sure you understand how to care for it before you leave the hospital. °· If you develop any of the symptoms of a urinary tract infection, such as burning or pain in the lower abdomen, fever, or an increase in the frequency of urination, contact your doctor or nurse immediately. °· Before you go home, make sure you know who to contact if you have questions or problems after you get home. °If you have questions, please ask your doctor or nurse. °Developed and co-sponsored by The Society for Healthcare Epidemiology of America (SHEA); Infectious Diseases Society of America (IDSA); The American Hospital Association; Association for Professionals in Infection Control and Epidemiology (APIC); Center for Disease Control (CDC); and The Joint Commission °Document Released: 11/24/2011 Document Reviewed: 11/24/2011 °ExitCare® Patient Information ©2015 [employer]. This information is not intended to replace advice given to you by your health care provider. Make sure you discuss any questions you have with your health care provider. ° °

## 2014-01-14 LAB — URINE CULTURE: Colony Count: >=100000

## 2014-08-15 ENCOUNTER — Ambulatory Visit (INDEPENDENT_AMBULATORY_CARE_PROVIDER_SITE_OTHER): Payer: Medicare Other | Admitting: Otolaryngology

## 2014-08-15 DIAGNOSIS — R040 Epistaxis: Secondary | ICD-10-CM | POA: Diagnosis not present

## 2014-09-12 ENCOUNTER — Ambulatory Visit (INDEPENDENT_AMBULATORY_CARE_PROVIDER_SITE_OTHER): Payer: Medicare Other | Admitting: Otolaryngology

## 2014-09-26 ENCOUNTER — Ambulatory Visit (INDEPENDENT_AMBULATORY_CARE_PROVIDER_SITE_OTHER): Payer: Medicare Other | Admitting: Otolaryngology

## 2014-09-26 DIAGNOSIS — R040 Epistaxis: Secondary | ICD-10-CM

## 2015-01-05 ENCOUNTER — Emergency Department (HOSPITAL_COMMUNITY)
Admission: EM | Admit: 2015-01-05 | Discharge: 2015-01-05 | Disposition: A | Discharge status: Home / Self Care | Payer: Medicare Other | Attending: Emergency Medicine | Admitting: Emergency Medicine

## 2015-01-05 ENCOUNTER — Encounter (HOSPITAL_COMMUNITY): Payer: Self-pay | Admitting: *Deleted

## 2015-01-05 ENCOUNTER — Emergency Department (HOSPITAL_COMMUNITY): Payer: Medicare Other

## 2015-01-05 DIAGNOSIS — R251 Tremor, unspecified: Secondary | ICD-10-CM | POA: Insufficient documentation

## 2015-01-05 DIAGNOSIS — Y92481 Parking lot as the place of occurrence of the external cause: Secondary | ICD-10-CM | POA: Insufficient documentation

## 2015-01-05 DIAGNOSIS — Z792 Long term (current) use of antibiotics: Secondary | ICD-10-CM | POA: Insufficient documentation

## 2015-01-05 DIAGNOSIS — W19XXXA Unspecified fall, initial encounter: Secondary | ICD-10-CM

## 2015-01-05 DIAGNOSIS — Z043 Encounter for examination and observation following other accident: Secondary | ICD-10-CM | POA: Diagnosis present

## 2015-01-05 DIAGNOSIS — Z7982 Long term (current) use of aspirin: Secondary | ICD-10-CM | POA: Diagnosis not present

## 2015-01-05 DIAGNOSIS — Z794 Long term (current) use of insulin: Secondary | ICD-10-CM | POA: Diagnosis not present

## 2015-01-05 DIAGNOSIS — I10 Essential (primary) hypertension: Secondary | ICD-10-CM | POA: Diagnosis not present

## 2015-01-05 DIAGNOSIS — Y9389 Activity, other specified: Secondary | ICD-10-CM | POA: Diagnosis not present

## 2015-01-05 DIAGNOSIS — Z8673 Personal history of transient ischemic attack (TIA), and cerebral infarction without residual deficits: Secondary | ICD-10-CM | POA: Insufficient documentation

## 2015-01-05 DIAGNOSIS — E119 Type 2 diabetes mellitus without complications: Secondary | ICD-10-CM | POA: Insufficient documentation

## 2015-01-05 DIAGNOSIS — Z880 Allergy status to penicillin: Secondary | ICD-10-CM | POA: Insufficient documentation

## 2015-01-05 DIAGNOSIS — W050XXA Fall from non-moving wheelchair, initial encounter: Secondary | ICD-10-CM | POA: Insufficient documentation

## 2015-01-05 DIAGNOSIS — Y998 Other external cause status: Secondary | ICD-10-CM | POA: Insufficient documentation

## 2015-01-05 DIAGNOSIS — Z79899 Other long term (current) drug therapy: Secondary | ICD-10-CM | POA: Diagnosis not present

## 2015-01-05 HISTORY — DX: Peripheral vascular disease, unspecified: I73.9

## 2015-01-05 HISTORY — DX: Embolism and thrombosis of unspecified artery: I74.9

## 2015-01-05 MED ORDER — LORAZEPAM 2 MG/ML IJ SOLN
2.0000 mg | Freq: Once | INTRAMUSCULAR | Status: AC
  Administered 2015-01-05: 2 mg via INTRAVENOUS
  Filled 2015-01-05: qty 1

## 2015-01-05 NOTE — ED Notes (Signed)
PA at bedside.

## 2015-01-05 NOTE — ED Notes (Signed)
Report given to Moshe CiproSana Myers at [facility].

## 2015-01-05 NOTE — ED Notes (Signed)
IV started and Ativan given to relax patient for CT, xray notified to pick pt. Up in approx 20-30 minutes.

## 2015-01-05 NOTE — ED Notes (Signed)
Patient will not be still for CT, PA notified.
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
## 2015-01-08 NOTE — ED Provider Notes (Signed)
CSN: [id number]     Arrival date & time 01/05/15  [id number] History   First MD Initiated Contact with Patient 01/05/15 [phone]     Chief Complaint  Patient presents with  . Fall     (Consider location/radiation/quality/duration/timing/severity/associated sxs/prior Treatment) HPI   [name] is a 81 y.o. female with hx of CVA, bilateral BKA and is non-verbal and tremblous at baseline, who presents to the Emergency Department for evaluation from an assisted living facility after a fall onto her right side from her wheelchair.  RN from the facility with the patient who states the patient was attempting to leave the facility (which she has tried previously) and her wheelchair wheel became stuck in a drain grate, causing her to fall from the chair.  Nurse states the fall was witnessed and he fell onto her right side and did not appear to strike her head.  Nurse denies LOC, change in behavior, vomiting, lethargy.  Pt denies pain.  Pt take ASA daily, but no other anti-coagulants  Past Medical History  Diagnosis Date  . HTN (hypertension)   . Stroke (HCC)   . Diabetes mellitus without complication (HCC)   . Embolism (HCC)   . PVD (peripheral vascular disease) [facility](HCC)    Past Surgical History  Procedure Laterality Date  . Below knee leg amputation      left  . 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 Lump removed      from left breast  . S/p hysterectomy    . Cholecystectomy    . Esophagogastroduodenoscopy  09/23/08    SLF:no barrett's ring ,mass or stricture   No family history on file. Social History  Substance Use Topics  . Smoking status: Never Smoker   . Smokeless tobacco: None  . Alcohol Use: No   OB History    No data available     Review of Systems  Unable to perform ROS: Patient nonverbal      Allergies  Penicillins  Home Medications   Prior to Admission medications   Medication Sig Start Date End Date Taking? Authorizing Provider  Amino  Acids-Protein Hydrolys (FEEDING SUPPLEMENT, PRO-STAT SUGAR FREE 64,) LIQD Take 30 mLs by mouth 2 (two) times daily.   Yes Historical Provider, MD  aspirin EC 81 MG tablet Take 81 mg by mouth daily.   Yes Historical Provider, MD  cholecalciferol (VITAMIN D) 1000 UNITS tablet Take 2,000 Units by mouth daily.    Yes Historical Provider, MD  Cranberry 450 MG CAPS Take 450 mg by mouth daily.   Yes Historical Provider, MD  insulin aspart (NOVOLOG) 100 UNIT/ML injection Inject 2-5 Units into the skin 4 (four) times daily - after meals and at bedtime. Per sliding. 200-250= 2 units >70 call MD; 251-300 = 3 units; 301-350 = 4 units; 351-400 = 5 units; <400 call MD.   Yes Historical Provider, MD  lisinopril (PRINIVIL,ZESTRIL) 20 MG tablet Take 20 mg by mouth daily.   Yes Historical Provider, MD  lovastatin (MEVACOR) 40 MG tablet Take 40 mg by mouth at bedtime.   Yes Historical Provider, MD  metFORMIN (GLUCOPHAGE-XR) 500 MG 24 hr tablet Take 1,500 mg by mouth daily with breakfast.   Yes Historical Provider, MD  nitrofurantoin (MACRODANTIN) 50 MG capsule Take 50 mg by mouth daily.   Yes Historical Provider, MD  omeprazole (PRILOSEC) 20 MG capsule Take 20 mg by mouth daily. 05/26/12  Yes Joselyn ArrowKandice L Jones, NP  sitaGLIPtin (JANUVIA) 100 MG tablet Take 100 mg by mouth daily.   Yes Historical Provider, MD   BP 157/61 mmHg  Pulse 60  Temp(Src) 97.9 F (36.6 C) (Oral)  Resp 18  SpO2 97% Physical Exam  Constitutional: She appears well-developed and well-nourished. No distress.  HENT:  Head: Normocephalic and atraumatic.  Eyes: Conjunctivae are normal. Pupils are equal, round, and reactive to light.  Neck: Normal range of motion.  Cardiovascular: Normal rate, regular rhythm and intact distal pulses.   Pulmonary/Chest: Effort normal and breath sounds normal. No respiratory distress. She exhibits no tenderness.  Abdominal: Soft. She exhibits no distension. There is no tenderness. There is no rebound.   Musculoskeletal: She exhibits no edema or tenderness.  Pt moves all extremities well.  No bony deformities or edema.  Neurological: She is alert. She exhibits normal muscle tone.  Skin: Skin is warm and dry.  Psychiatric: She has a normal mood and affect.  Nursing note and vitals reviewed.   ED Course  Procedures (including critical care time)  Imaging Review Ct Head Wo Contrast  01/05/2015  CLINICAL DATA:  Flipped a wheelchair in no parking lot at [facility], found lying on RIGHT side, past history of hypertension, diabetes mellitus, stroke EXAM: CT HEAD WITHOUT CONTRAST CT CERVICAL SPINE WITHOUT CONTRAST TECHNIQUE: Multidetector CT imaging of the head and cervical spine was performed following the standard protocol without intravenous contrast. Multiplanar CT image reconstructions of the cervical spine were also generated. COMPARISON:  CT head 04/21/2013 FINDINGS: CT HEAD FINDINGS Normal ventricular morphology. No midline shift or mass effect. Minimal small vessel chronic ischemic changes of deep cerebral white matter. Old lacunar infarcts in periventricular white matter bilaterally. Old LEFT pontine lacunar infarct. No intracranial hemorrhage, mass lesion, or evidence acute infarction. No extra-axial fluid collections. Calcifications within falx. Visualized paranasal sinuses and mastoid air cells clear. Atherosclerotic calcifications of the carotid siphons. Bones demineralized. CT CERVICAL SPINE FINDINGS Prevertebral soft tissues normal thickness. Diffuse osseous demineralization. Disc space narrowing with endplate spur formation C5-C6 and C6-C7. Significant discogenic sclerosis C6-C7. Scattered mild facet degenerative changes bilaterally. Vertebral body heights maintained without fracture or subluxation. Visualized skullbase intact. Atherosclerotic calcification of the vertebral arteries and carotid bifurcations. Lung apices clear. IMPRESSION: Atrophy with small vessel chronic ischemic changes of  deep cerebral white matter. Old periventricular white matter and LEFT pontine infarcts. No acute intracranial abnormalities. Osseous demineralization with multilevel degenerative disc and facet disease changes cervical spine. No acute cervical spine abnormalities. Extensive atherosclerotic calcifications. Electronically Signed   By: Ulyses Southward M.D.   On: 01/05/2015 13:10   Ct Cervical Spine Wo Contrast  01/05/2015  CLINICAL DATA:  Flipped a wheelchair in no parking lot at [facility], found lying on RIGHT side, past history of hypertension, diabetes mellitus, stroke EXAM: CT HEAD WITHOUT CONTRAST CT CERVICAL SPINE WITHOUT CONTRAST TECHNIQUE: Multidetector CT imaging of the head and cervical spine was performed following the standard protocol without intravenous contrast. Multiplanar CT image reconstructions of the cervical spine were also generated. COMPARISON:  CT head 04/21/2013 FINDINGS: CT HEAD FINDINGS Normal ventricular morphology. No midline shift or mass effect. Minimal small vessel chronic ischemic changes of deep cerebral white matter. Old lacunar infarcts in periventricular white matter bilaterally. Old LEFT pontine lacunar infarct. No intracranial hemorrhage, mass lesion, or evidence acute infarction. No extra-axial fluid collections. Calcifications within falx. Visualized paranasal sinuses and mastoid air cells clear. Atherosclerotic calcifications of the carotid siphons. Bones demineralized. CT CERVICAL SPINE FINDINGS Prevertebral soft tissues normal thickness. Diffuse  osseous demineralization. Disc space narrowing with endplate spur formation C5-C6 and C6-C7. Significant discogenic sclerosis C6-C7. Scattered mild facet degenerative changes bilaterally. Vertebral body heights maintained without fracture or subluxation. Visualized skullbase intact. Atherosclerotic calcification of the vertebral arteries and carotid bifurcations. Lung apices clear. IMPRESSION: Atrophy with small vessel chronic ischemic  changes of deep cerebral white matter. Old periventricular white matter and LEFT pontine infarcts. No acute intracranial abnormalities. Osseous demineralization with multilevel degenerative disc and facet disease changes cervical spine. No acute cervical spine abnormalities. Extensive atherosclerotic calcifications. Electronically Signed   By: Ulyses Southward M.D.   On: 01/05/2015 13:10   Dg Hip Unilat With Pelvis 2-3 Views Right  01/05/2015  CLINICAL DATA:  Fall, BILATERAL above-knee amputee, hypertension, stroke, diabetes mellitus, peripheral vascular disease EXAM: DG HIP (WITH OR WITHOUT PELVIS) 2-3V RIGHT COMPARISON:  None FINDINGS: Narrowing of the hip joints bilaterally. Diffuse osseous demineralization. SI joints grossly symmetric and preserved. Osseous pelvis appears grossly intact. No acute fracture, dislocation, or bone destruction. Extensive atherosclerotic calcifications. IMPRESSION: Osseous demineralization with mild degenerative changes of both hip joints. No acute bony abnormalities. Electronically Signed   By: Ulyses Southward M.D.   On: 01/05/2015 11:32    I have personally reviewed and evaluated these images and lab results as part of my medical decision-making.    MDM   Final diagnoses:  Fall, initial encounter   Pt fm [facility] and has RN from the facility at her bedside and pt has resting tremors which nurse states is her neurological baseline.  No obvious injuries on exam, will give IV ativan so the imaging can be obtained.  XR's reviewed.  Pt exam and findings discussed with Dr. Karma Ganja and pt also seen by her prior to pt d/c.  Family (son) agrees to close PMD f/u for recheck and advised to return here for any worsening sx's.    Pt is resting comfortably and appears stable for d/c    Pauline Aus, PA-C 01/08/15 1449  Jerelyn Scott, MD 01/14/15 [phone]

## 2015-07-04 ENCOUNTER — Emergency Department (HOSPITAL_COMMUNITY): Payer: Medicare Other

## 2015-07-04 ENCOUNTER — Inpatient Hospital Stay (HOSPITAL_COMMUNITY)
Admission: EM | Admit: 2015-07-04 | Discharge: 2015-07-21 | DRG: 871 | Disposition: A | Discharge status: Skilled Nursing Facility | Payer: Medicare Other | Attending: Internal Medicine | Admitting: Internal Medicine

## 2015-07-04 ENCOUNTER — Encounter (HOSPITAL_COMMUNITY): Payer: Self-pay | Admitting: *Deleted

## 2015-07-04 DIAGNOSIS — T464X5A Adverse effect of angiotensin-converting-enzyme inhibitors, initial encounter: Secondary | ICD-10-CM | POA: Diagnosis present

## 2015-07-04 DIAGNOSIS — E876 Hypokalemia: Secondary | ICD-10-CM | POA: Diagnosis present

## 2015-07-04 DIAGNOSIS — N179 Acute kidney failure, unspecified: Secondary | ICD-10-CM

## 2015-07-04 DIAGNOSIS — Z90710 Acquired absence of both cervix and uterus: Secondary | ICD-10-CM | POA: Diagnosis not present

## 2015-07-04 DIAGNOSIS — E1165 Type 2 diabetes mellitus with hyperglycemia: Secondary | ICD-10-CM | POA: Diagnosis present

## 2015-07-04 DIAGNOSIS — B9620 Unspecified Escherichia coli [E. coli] as the cause of diseases classified elsewhere: Secondary | ICD-10-CM | POA: Diagnosis present

## 2015-07-04 DIAGNOSIS — IMO0002 Reserved for concepts with insufficient information to code with codable children: Secondary | ICD-10-CM | POA: Diagnosis present

## 2015-07-04 DIAGNOSIS — S31809A Unspecified open wound of unspecified buttock, initial encounter: Secondary | ICD-10-CM | POA: Diagnosis present

## 2015-07-04 DIAGNOSIS — K219 Gastro-esophageal reflux disease without esophagitis: Secondary | ICD-10-CM | POA: Diagnosis present

## 2015-07-04 DIAGNOSIS — I739 Peripheral vascular disease, unspecified: Secondary | ICD-10-CM | POA: Diagnosis present

## 2015-07-04 DIAGNOSIS — A4189 Other specified sepsis: Secondary | ICD-10-CM | POA: Diagnosis not present

## 2015-07-04 DIAGNOSIS — L8990 Pressure ulcer of unspecified site, unspecified stage: Secondary | ICD-10-CM | POA: Diagnosis not present

## 2015-07-04 DIAGNOSIS — F0390 Unspecified dementia without behavioral disturbance: Secondary | ICD-10-CM

## 2015-07-04 DIAGNOSIS — E871 Hypo-osmolality and hyponatremia: Secondary | ICD-10-CM | POA: Diagnosis present

## 2015-07-04 DIAGNOSIS — I639 Cerebral infarction, unspecified: Secondary | ICD-10-CM | POA: Diagnosis present

## 2015-07-04 DIAGNOSIS — D649 Anemia, unspecified: Secondary | ICD-10-CM | POA: Diagnosis present

## 2015-07-04 DIAGNOSIS — I10 Essential (primary) hypertension: Secondary | ICD-10-CM | POA: Diagnosis present

## 2015-07-04 DIAGNOSIS — T365X5A Adverse effect of aminoglycosides, initial encounter: Secondary | ICD-10-CM | POA: Diagnosis present

## 2015-07-04 DIAGNOSIS — Z89512 Acquired absence of left leg below knee: Secondary | ICD-10-CM | POA: Diagnosis not present

## 2015-07-04 DIAGNOSIS — S31819A Unspecified open wound of right buttock, initial encounter: Secondary | ICD-10-CM | POA: Diagnosis not present

## 2015-07-04 DIAGNOSIS — Z89511 Acquired absence of right leg below knee: Secondary | ICD-10-CM | POA: Diagnosis not present

## 2015-07-04 DIAGNOSIS — T368X5A Adverse effect of other systemic antibiotics, initial encounter: Secondary | ICD-10-CM | POA: Diagnosis present

## 2015-07-04 DIAGNOSIS — Z87440 Personal history of urinary (tract) infections: Secondary | ICD-10-CM | POA: Diagnosis not present

## 2015-07-04 DIAGNOSIS — E860 Dehydration: Secondary | ICD-10-CM | POA: Diagnosis present

## 2015-07-04 DIAGNOSIS — R000 Tachycardia, unspecified: Secondary | ICD-10-CM | POA: Diagnosis present

## 2015-07-04 DIAGNOSIS — E872 Acidosis, unspecified: Secondary | ICD-10-CM | POA: Diagnosis present

## 2015-07-04 DIAGNOSIS — Z7982 Long term (current) use of aspirin: Secondary | ICD-10-CM | POA: Diagnosis not present

## 2015-07-04 DIAGNOSIS — Z794 Long term (current) use of insulin: Secondary | ICD-10-CM

## 2015-07-04 DIAGNOSIS — I2510 Atherosclerotic heart disease of native coronary artery without angina pectoris: Secondary | ICD-10-CM | POA: Diagnosis present

## 2015-07-04 DIAGNOSIS — N390 Urinary tract infection, site not specified: Secondary | ICD-10-CM | POA: Diagnosis present

## 2015-07-04 DIAGNOSIS — R001 Bradycardia, unspecified: Secondary | ICD-10-CM | POA: Diagnosis not present

## 2015-07-04 DIAGNOSIS — Z8673 Personal history of transient ischemic attack (TIA), and cerebral infarction without residual deficits: Secondary | ICD-10-CM

## 2015-07-04 DIAGNOSIS — L89313 Pressure ulcer of right buttock, stage 3: Secondary | ICD-10-CM | POA: Diagnosis present

## 2015-07-04 DIAGNOSIS — Z515 Encounter for palliative care: Secondary | ICD-10-CM | POA: Diagnosis not present

## 2015-07-04 DIAGNOSIS — E1151 Type 2 diabetes mellitus with diabetic peripheral angiopathy without gangrene: Secondary | ICD-10-CM | POA: Diagnosis present

## 2015-07-04 DIAGNOSIS — I472 Ventricular tachycardia: Secondary | ICD-10-CM | POA: Diagnosis not present

## 2015-07-04 HISTORY — DX: Unspecified dementia, unspecified severity, without behavioral disturbance, psychotic disturbance, mood disturbance, and anxiety: F03.90

## 2015-07-04 LAB — BLOOD GAS, ARTERIAL
Acid-base deficit: 17.6 mmol/L — ABNORMAL HIGH (ref 0.0–2.0)
Bicarbonate: 11.5 mEq/L — ABNORMAL LOW (ref 20.0–24.0)
Drawn by: 105551
FIO2: 0.21
O2 SAT: 96.6 %
PCO2 ART: 21.5 mmHg — AB (ref 35.0–45.0)
PO2 ART: 104 mmHg — AB (ref 80.0–100.0)
pH, Arterial: 7.229 — ABNORMAL LOW (ref 7.350–7.450)

## 2015-07-04 LAB — CBC WITH DIFFERENTIAL/PLATELET
Basophils Absolute: 0 10*3/uL (ref 0.0–0.1)
Basophils Relative: 0 %
EOS ABS: 0 10*3/uL (ref 0.0–0.7)
EOS PCT: 0 %
HCT: 34 % — ABNORMAL LOW (ref 36.0–46.0)
Hemoglobin: 11.2 g/dL — ABNORMAL LOW (ref 12.0–15.0)
LYMPHS ABS: 1 10*3/uL (ref 0.7–4.0)
Lymphocytes Relative: 6 %
MCH: 29.4 pg (ref 26.0–34.0)
MCHC: 32.9 g/dL (ref 30.0–36.0)
MCV: 89.2 fL (ref 78.0–100.0)
MONO ABS: 0.5 10*3/uL (ref 0.1–1.0)
MONOS PCT: 3 %
Neutro Abs: 13.5 10*3/uL — ABNORMAL HIGH (ref 1.7–7.7)
Neutrophils Relative %: 91 %
Platelets: 371 10*3/uL (ref 150–400)
RBC: 3.81 MIL/uL — ABNORMAL LOW (ref 3.87–5.11)
RDW: 13.6 % (ref 11.5–15.5)
WBC: 14.9 10*3/uL — ABNORMAL HIGH (ref 4.0–10.5)

## 2015-07-04 LAB — COMPREHENSIVE METABOLIC PANEL
AST: 35 U/L (ref 15–41)
Albumin: 2.5 g/dL — ABNORMAL LOW (ref 3.5–5.0)
Alkaline Phosphatase: 63 U/L (ref 38–126)
Anion gap: 28 — ABNORMAL HIGH (ref 5–15)
BILIRUBIN TOTAL: 0.5 mg/dL (ref 0.3–1.2)
BUN: 89 mg/dL — AB (ref 6–20)
CO2: 9 mmol/L — ABNORMAL LOW (ref 22–32)
CREATININE: 5.88 mg/dL — AB (ref 0.44–1.00)
Calcium: 8 mg/dL — ABNORMAL LOW (ref 8.9–10.3)
Chloride: 103 mmol/L (ref 101–111)
GFR calc Af Amer: 7 mL/min — ABNORMAL LOW (ref >60)
GFR, EST NON AFRICAN AMERICAN: 6 mL/min — AB (ref >60)
Glucose, Bld: 199 mg/dL — ABNORMAL HIGH (ref 65–99)
POTASSIUM: 4.4 mmol/L (ref 3.5–5.1)
Sodium: 140 mmol/L (ref 135–145)
TOTAL PROTEIN: 6.5 g/dL (ref 6.5–8.1)

## 2015-07-04 LAB — URINALYSIS, ROUTINE W REFLEX MICROSCOPIC
Bilirubin Urine: NEGATIVE
GLUCOSE, UA: 500 mg/dL — AB
NITRITE: NEGATIVE
PH: 5.5 (ref 5.0–8.0)
Protein, ur: 100 mg/dL — AB
SPECIFIC GRAVITY, URINE: 1.025 (ref 1.005–1.030)

## 2015-07-04 LAB — PROCALCITONIN: Procalcitonin: <0.1 ng/mL

## 2015-07-04 LAB — URINE MICROSCOPIC-ADD ON

## 2015-07-04 LAB — LACTIC ACID, PLASMA
Lactic Acid, Venous: 11.3 mmol/L (ref 0.5–2.0)
Lactic Acid, Venous: 9.6 mmol/L (ref 0.5–2.0)

## 2015-07-04 LAB — TROPONIN I: Troponin I: 0.03 ng/mL (ref <0.031)

## 2015-07-04 MED ORDER — SODIUM CHLORIDE 0.9 % IV SOLN
INTRAVENOUS | Status: AC
  Administered 2015-07-04: 23:00:00 via INTRAVENOUS

## 2015-07-04 MED ORDER — PRO-STAT SUGAR FREE PO LIQD
30.0000 mL | Freq: Two times a day (BID) | ORAL | Status: DC
  Administered 2015-07-05 – 2015-07-21 (×25): 30 mL via ORAL
  Filled 2015-07-04 (×28): qty 30

## 2015-07-04 MED ORDER — SODIUM CHLORIDE 0.9 % IV SOLN: INTRAVENOUS | Status: DC

## 2015-07-04 MED ORDER — HEPARIN SOD (PORK) LOCK FLUSH 1 UNIT/ML IV SOLN: 0.5000 mL | Freq: Every day | INTRAVENOUS | Status: DC

## 2015-07-04 MED ORDER — HEPARIN SODIUM (PORCINE) 5000 UNIT/ML IJ SOLN
5000.0000 [IU] | Freq: Three times a day (TID) | INTRAMUSCULAR | Status: DC
  Administered 2015-07-05 – 2015-07-21 (×45): 5000 [IU] via SUBCUTANEOUS
  Filled 2015-07-04 (×43): qty 1

## 2015-07-04 MED ORDER — SODIUM CHLORIDE 0.9 % IV BOLUS (SEPSIS)
1000.0000 mL | Freq: Once | INTRAVENOUS | Status: AC
  Administered 2015-07-04: 1000 mL via INTRAVENOUS

## 2015-07-04 MED ORDER — SODIUM CHLORIDE 0.9 % IV BOLUS (SEPSIS): 1000.0000 mL | Freq: Once | INTRAVENOUS | Status: DC

## 2015-07-04 MED ORDER — DEXTROSE 5 % IV SOLN
2.0000 g | Freq: Once | INTRAVENOUS | Status: AC
  Administered 2015-07-04: 2 g via INTRAVENOUS
  Filled 2015-07-04: qty 2

## 2015-07-04 MED ORDER — HYDROCODONE-ACETAMINOPHEN 5-325 MG PO TABS
1.0000 | ORAL_TABLET | Freq: Two times a day (BID) | ORAL | Status: DC
  Administered 2015-07-05 – 2015-07-21 (×29): 1 via ORAL
  Filled 2015-07-04 (×31): qty 1

## 2015-07-04 MED ORDER — ONDANSETRON HCL 4 MG PO TABS: 4.0000 mg | ORAL_TABLET | Freq: Four times a day (QID) | ORAL | Status: DC | PRN

## 2015-07-04 MED ORDER — ASPIRIN EC 81 MG PO TBEC
81.0000 mg | DELAYED_RELEASE_TABLET | Freq: Every day | ORAL | Status: DC
  Administered 2015-07-06 – 2015-07-21 (×15): 81 mg via ORAL
  Filled 2015-07-04 (×16): qty 1

## 2015-07-04 MED ORDER — INSULIN ASPART 100 UNIT/ML ~~LOC~~ SOLN
0.0000 [IU] | Freq: Three times a day (TID) | SUBCUTANEOUS | Status: DC
  Administered 2015-07-06: 1 [IU] via SUBCUTANEOUS
  Administered 2015-07-06: 2 [IU] via SUBCUTANEOUS
  Administered 2015-07-07: 1 [IU] via SUBCUTANEOUS
  Administered 2015-07-07: 2 [IU] via SUBCUTANEOUS
  Administered 2015-07-08 – 2015-07-11 (×4): 1 [IU] via SUBCUTANEOUS
  Administered 2015-07-12: 2 [IU] via SUBCUTANEOUS
  Administered 2015-07-12 – 2015-07-13 (×2): 1 [IU] via SUBCUTANEOUS
  Administered 2015-07-18: 3 [IU] via SUBCUTANEOUS
  Administered 2015-07-19: 1 [IU] via SUBCUTANEOUS
  Administered 2015-07-20: 2 [IU] via SUBCUTANEOUS
  Administered 2015-07-21: 1 [IU] via SUBCUTANEOUS

## 2015-07-04 MED ORDER — SODIUM CHLORIDE 0.9 % IV BOLUS (SEPSIS): 500.0000 mL | INTRAVENOUS | Status: AC

## 2015-07-04 MED ORDER — METOCLOPRAMIDE HCL 5 MG/ML IJ SOLN
5.0000 mg | Freq: Once | INTRAMUSCULAR | Status: AC
  Administered 2015-07-04: 5 mg via INTRAVENOUS
  Filled 2015-07-04: qty 2

## 2015-07-04 MED ORDER — VANCOMYCIN HCL IN DEXTROSE 1-5 GM/200ML-% IV SOLN
1000.0000 mg | Freq: Once | INTRAVENOUS | Status: AC
  Administered 2015-07-04: 1000 mg via INTRAVENOUS
  Filled 2015-07-04: qty 200

## 2015-07-04 MED ORDER — SODIUM CHLORIDE 0.9% FLUSH
3.0000 mL | Freq: Two times a day (BID) | INTRAVENOUS | Status: DC
  Administered 2015-07-04 – 2015-07-21 (×15): 3 mL via INTRAVENOUS

## 2015-07-04 MED ORDER — ONDANSETRON HCL 4 MG/2ML IJ SOLN
4.0000 mg | Freq: Four times a day (QID) | INTRAMUSCULAR | Status: DC | PRN
  Administered 2015-07-05 – 2015-07-20 (×4): 4 mg via INTRAVENOUS
  Filled 2015-07-04 (×5): qty 2

## 2015-07-04 MED ORDER — LEVOFLOXACIN IN D5W 750 MG/150ML IV SOLN
750.0000 mg | Freq: Once | INTRAVENOUS | Status: AC
  Administered 2015-07-04: 750 mg via INTRAVENOUS
  Filled 2015-07-04: qty 150

## 2015-07-04 MED ORDER — SODIUM CHLORIDE 0.9 % IV SOLN
INTRAVENOUS | Status: DC
  Administered 2015-07-05 – 2015-07-06 (×3): via INTRAVENOUS

## 2015-07-04 NOTE — ED Notes (Signed)
Pt comes in from [facility] for questionable renal failure. Per facility, pt has a wound to her right buttocks which is positive for E. Coli. Pt has been being given Gentamycin but it was stopped yesterday because of elevated BUN and Createnine. Pt when then started on D5 0.45 NS at 50 mL/hr yesterday with one complete bolus finished. Facility states pt has not eaten or drank anything today. Pt is only oriented to herself upon arrival to ED; this is normal per facility.   Pts labs were Creat 5.65, BUN 79.9

## 2015-07-04 NOTE — ED Notes (Signed)
New urine bag attached to patients catheter, waiting for new urine specimen.

## 2015-07-04 NOTE — Progress Notes (Signed)
ANTIBIOTIC CONSULT NOTE-Preliminary  Pharmacy Consult for Vancomycin, Levaquin, Aztreonam Indication: sepsis  Allergies  Allergen Reactions  . Penicillins     REACTION: unknown reaction: MAR reported    Patient Measurements:  Last recorded weight = 45Kg  Vital Signs: BP: 182/65 mmHg (04/21 1940) Pulse Rate: 101 (04/21 1940)  Labs:  Recent Labs  07/04/15 1856  WBC 14.9*  HGB 11.2*  PLT 371  CREATININE 5.88*    CrCl cannot be calculated (Unknown ideal weight.).  No results for input(s): VANCOTROUGH, VANCOPEAK, VANCORANDOM, GENTTROUGH, GENTPEAK, GENTRANDOM, TOBRATROUGH, TOBRAPEAK, TOBRARND, AMIKACINPEAK, AMIKACINTROU, AMIKACIN in the last 72 hours.   Microbiology: No results found for this or any previous visit (from the past 720 hour(s)).  Medical History: Past Medical History  Diagnosis Date  . HTN (hypertension)   . Stroke (HCC)   . Diabetes mellitus without complication (HCC)   . Embolism (HCC)   . PVD (peripheral vascular disease) (HCC)   . Dementia     Assessment: 82yo female.  SCr elevated.  Small body habitus.    Plan:  Preliminary review of pertinent patient information completed.  Protocol will be initiated with a one-time dose(s) of Vancomycin 1000mg , Aztreonam 2gm and Levaquin 750mg .  (no additional doses needed tonight) Jeani HawkingAnnie Penn clinical pharmacist will complete review during morning rounds to assess patient and finalize treatment regimen.  Valrie HartHall, Deklyn Trachtenberg A, ColoradoRPH 07/04/2015,8:38 PM

## 2015-07-04 NOTE — ED Notes (Signed)
Pt has PICC line in place, no record of it on our charts to verify placement. Verifying placement by  PICC line by Chest-xray before running fluids.

## 2015-07-04 NOTE — ED Provider Notes (Signed)
CSN: [id number]     Arrival date & time 07/04/15  1755 History   First MD Initiated Contact with Patient 07/04/15 1759     Chief Complaint  Patient presents with  . Abnormal Labs       The history is provided by the EMS personnel and the nursing home. The history is limited by the condition of the patient (Hx dementia).  Pt was seen at 1805.  Per EMS and NH report: Pt sent to the ED from NH with c/o "abnormal labs." Pt has wound on her right buttocks that has been being tx with IV gentamycin for +Ecoli. Pt's BUN/Cr were noted to be elevated yesterday (BUN 79.9/Cr 5.65) and the gentamycin was stopped. Pt was then started on IV D5/.45 NS at 41ml/hr yesterday. NH states pt has not taken any PO today. Pt has significant hx of dementia and is acting per her baseline per NH staff.    Past Medical History  Diagnosis Date  . HTN (hypertension)   . Stroke (HCC)   . Diabetes mellitus without complication (HCC)   . Embolism (HCC)   . PVD (peripheral vascular disease) (HCC)   . Dementia    Past Surgical History  Procedure Laterality Date  . Below knee leg amputation      left  . 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 Lump removed      from left breast  . S/p hysterectomy    . Cholecystectomy    . Esophagogastroduodenoscopy  09/23/08    SLF:no barrett's ring ,mass or stricture    Social History  Substance Use Topics  . Smoking status: Never Smoker   . Smokeless tobacco: None  . Alcohol Use: No    Review of Systems  Unable to perform ROS: Dementia    Allergies  Penicillins  Home Medications   Prior to Admission medications   Medication Sig Start Date End Date Taking? Authorizing Provider  Amino Acids-Protein Hydrolys (FEEDING SUPPLEMENT, PRO-STAT SUGAR FREE 64,) LIQD Take 30 mLs by mouth 2 (two) times daily.    Historical Provider, MD  aspirin EC 81 MG tablet Take 81 mg by mouth daily.    Historical Provider, MD  cholecalciferol (VITAMIN D) 1000  UNITS tablet Take 2,000 Units by mouth daily.     Historical Provider, MD  Cranberry 450 MG CAPS Take 450 mg by mouth daily.    Historical Provider, MD  insulin aspart (NOVOLOG) 100 UNIT/ML injection Inject 2-5 Units into the skin 4 (four) times daily - after meals and at bedtime. Per sliding. 200-250= 2 units >70 call MD; 251-300 = 3 units; 301-350 = 4 units; 351-400 = 5 units; <400 call MD.    Historical Provider, MD  lisinopril (PRINIVIL,ZESTRIL) 20 MG tablet Take 20 mg by mouth daily.    Historical Provider, MD  lovastatin (MEVACOR) 40 MG tablet Take 40 mg by mouth at bedtime.    Historical Provider, MD  metFORMIN (GLUCOPHAGE-XR) 500 MG 24 hr tablet Take 1,500 mg by mouth daily with breakfast.    Historical Provider, MD  nitrofurantoin (MACRODANTIN) 50 MG capsule Take 50 mg by mouth daily.    Historical Provider, MD  omeprazole (PRILOSEC) 20 MG capsule Take 20 mg by mouth daily. 05/26/12   Joselyn Arrow, NP  sitaGLIPtin (JANUVIA) 100 MG tablet Take 100 mg by mouth daily.    Historical Provider, MD   BP 175/66 mmHg  Pulse 98  Resp 24  SpO2 100% rectal temp 97 Physical Exam 1810: Physical examination:  Nursing notes reviewed; Vital signs and O2 SAT reviewed;  Constitutional: Well developed, Well nourished, In no acute distress; Head:  Normocephalic, atraumatic; Eyes: EOMI, PERRL, No scleral icterus; ENMT: Mouth and pharynx normal, Mucous membranes dry; Neck: Supple, Full range of motion, No lymphadenopathy; Cardiovascular: Regular rate and rhythm, No gallop; Respiratory: Breath sounds clear & equal bilaterally, No wheezes. Normal respiratory effort/excursion; Chest: Nontender, Movement normal; Abdomen: Soft, Nontender, Nondistended, Normal bowel sounds. +DSD x2 on right buttock and lower sacrum.; Genitourinary: No CVA tenderness. +foley draining cloudy yellow urine.; Extremities: Pulses normal, No tenderness, No edema. Bilat LE's amputations..; Neuro: Awake, alert, moaning, confused per baseline.  No facial droop. Moves extremities on stretcher.; Skin: Color normal, Warm, Dry.   ED Course  Procedures (including critical care time) Labs Review   Imaging Review  I have personally reviewed and evaluated these images and lab results as part of my medical decision-making.   EKG Interpretation   Date/Time:  Friday July 04 2015 18:14:40 EDT Ventricular Rate:  87 PR Interval:  151 QRS Duration: 131 QT Interval:  434 QTC Calculation: 522 R Axis:   46 Text Interpretation:  Sinus arrhythmia Multiple premature complexes, vent  & supraven Right bundle branch block Nonspecific ST and T wave abnormality  When compared with ECG of 01/11/2014 Nonspecific ST and T wave abnormality  is now Present Confirmed by [facility]  MD, Nicholos Johns [phone]) on 07/04/2015  6:20:11 PM      MDM  MDM Reviewed: previous chart, nursing note and vitals Reviewed previous: labs and ECG Interpretation: labs, ECG and x-ray Total time providing critical care: 30-74 minutes. This excludes time spent performing separately reportable procedures and services. Consults: admitting MD     CRITICAL CARE Performed by: Laray Anger Total critical care time: 35 minutes Critical care time was exclusive of separately billable procedures and treating other patients. Critical care was necessary to treat or prevent imminent or life-threatening deterioration. Critical care was time spent personally by me on the following activities: development of treatment plan with patient and/or surrogate as well as nursing, discussions with consultants, evaluation of patient's response to treatment, examination of patient, obtaining history from patient or surrogate, ordering and performing treatments and interventions, ordering and review of laboratory studies, ordering and review of radiographic studies, pulse oximetry and re-evaluation of patient's condition.   Results for orders placed or performed during the hospital encounter  of 07/04/15  Comprehensive metabolic panel  Result Value Ref Range   Sodium 140 135 - 145 mmol/L   Potassium 4.4 3.5 - 5.1 mmol/L   Chloride 103 101 - 111 mmol/L   CO2 9 (L) 22 - 32 mmol/L   Glucose, Bld 199 (H) 65 - 99 mg/dL   BUN 89 (H) 6 - 20 mg/dL   Creatinine, Ser 6.04 (H) 0.44 - 1.00 mg/dL   Calcium 8.0 (L) 8.9 - 10.3 mg/dL   Total Protein 6.5 6.5 - 8.1 g/dL   Albumin 2.5 (L) 3.5 - 5.0 g/dL   AST 35 15 - 41 U/L   ALT <5 (L) 14 - 54 U/L   Alkaline Phosphatase 63 38 - 126 U/L   Total Bilirubin 0.5 0.3 - 1.2 mg/dL   GFR calc non Af Amer 6 (L) >60 mL/min   GFR calc Af Amer 7 (L) >60 mL/min   Anion gap 28 (H) 5 - 15  Troponin I  Result Value Ref Range   Troponin I 0.03 <0.031  ng/mL  Lactic acid, plasma  Result Value Ref Range   Lactic Acid, Venous 11.3 (HH) 0.5 - 2.0 mmol/L  CBC with Differential  Result Value Ref Range   WBC 14.9 (H) 4.0 - 10.5 K/uL   RBC 3.81 (L) 3.87 - 5.11 MIL/uL   Hemoglobin 11.2 (L) 12.0 - 15.0 g/dL   HCT 16.134.0 (L) 09.636.0 - 04.546.0 %   MCV 89.2 78.0 - 100.0 fL   MCH 29.4 26.0 - 34.0 pg   MCHC 32.9 30.0 - 36.0 g/dL   RDW 40.913.6 81.111.5 - 91.415.5 %   Platelets 371 150 - 400 K/uL   Neutrophils Relative % 91 %   Neutro Abs 13.5 (H) 1.7 - 7.7 K/uL   Lymphocytes Relative 6 %   Lymphs Abs 1.0 0.7 - 4.0 K/uL   Monocytes Relative 3 %   Monocytes Absolute 0.5 0.1 - 1.0 K/uL   Eosinophils Relative 0 %   Eosinophils Absolute 0.0 0.0 - 0.7 K/uL   Basophils Relative 0 %   Basophils Absolute 0.0 0.0 - 0.1 K/uL  Urinalysis, Routine w reflex microscopic  Result Value Ref Range   Color, Urine YELLOW YELLOW   APPearance CLOUDY (A) CLEAR   Specific Gravity, Urine 1.025 1.005 - 1.030   pH 5.5 5.0 - 8.0   Glucose, UA 500 (A) NEGATIVE mg/dL   Hgb urine dipstick MODERATE (A) NEGATIVE   Bilirubin Urine NEGATIVE NEGATIVE   Ketones, ur TRACE (A) NEGATIVE mg/dL   Protein, ur 782100 (A) NEGATIVE mg/dL   Nitrite NEGATIVE NEGATIVE   Leukocytes, UA MODERATE (A) NEGATIVE  Urine  microscopic-add on  Result Value Ref Range   Squamous Epithelial / LPF 6-30 (A) NONE SEEN   WBC, UA TOO NUMEROUS TO COUNT 0 - 5 WBC/hpf   RBC / HPF TOO NUMEROUS TO COUNT 0 - 5 RBC/hpf   Bacteria, UA MANY (A) NONE SEEN   Dg Chest Port 1 View 07/04/2015  CLINICAL DATA:  PICC line placement.  Acute renal failure. EXAM: PORTABLE CHEST 1 VIEW COMPARISON:  04/21/2013 FINDINGS: Right PICC line tip:  SVC.  No pneumothorax. Borderline enlargement of the cardiopericardial silhouette with atherosclerotic aortic arch. The lungs appear clear. IMPRESSION: 1. Right PICC line tip:  SVC.  No complicating feature. 2. Borderline enlargement of the cardiopericardial silhouette. 3. Atherosclerotic aortic arch. Electronically Signed   By: Gaylyn RongWalter  Liebkemann M.D.   On: 07/04/2015 18:52    2055:  Family states pt is full code. Pt with new ARF on labs. UC and BC obtained, IV abx ordered. IVF bolus in progress. Pt's BP remains stable, no fever while in the ED. Multiple potential sources for infection: urine, PICC, decub. Pt's acidosis also likely due to ARF. Dx and testing d/w pt and family.  Questions answered.  Verb understanding, agreeable to admit.  T/C to [facility]. Onalee Huaavid, case discussed, including:  HPI, pertinent PM/SHx, VS/PE, dx testing, ED course and treatment:  Agreeable to admit, requests to write temporary orders, obtain tele bed to team APAdmits.   Samuel JesterKathleen Xee Hollman, DO 07/07/15 0111

## 2015-07-04 NOTE — H&P (Signed)
PCP:   [name], MD   Chief Complaint:  Renal failure  HPI: 82 yo female h/o dementia, chronic indwelling foley, decub ulcer, htn, CVA, PVD s/p bilateral amputation recently started on gentamycin for UTI with E. Coli.  All history obtained from ED staff as pt cannot provide history due to dementia.  It was noted her renal function was abnormal yesterday with cr bumped up to 5, no known previous reported so the gent was stopped and pt was started on ivf yesterday.  Today repeat labs showed worsening renal failure so sent to ED.  Pt does not eat or drink well.  Her urine in her foley has been cloudy.  No report of fever.  Pt very dehydrated, and referred for admission for sepsis/uti/ARF.  Review of Systems:  Unobtainable due to dementia   Past Medical History: Past Medical History  Diagnosis Date  . HTN (hypertension)   . Stroke (HCC)   . Diabetes mellitus without complication (HCC)   . Embolism (HCC)   . PVD (peripheral vascular disease) (HCC)   . Dementia    Past Surgical History  Procedure Laterality Date  . Below knee leg amputation      left  . 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 Lump removed      from left breast  . S/p hysterectomy    . Cholecystectomy    . Esophagogastroduodenoscopy  09/23/08    SLF:no barrett's ring ,mass or stricture    Medications: Prior to Admission medications   Medication Sig Start Date End Date Taking? Authorizing Provider  Amino Acids-Protein Hydrolys (FEEDING SUPPLEMENT, PRO-STAT SUGAR FREE 64,) LIQD Take 30 mLs by mouth 2 (two) times daily.   Yes Historical Provider, MD  aspirin EC 81 MG tablet Take 81 mg by mouth daily.   Yes Historical Provider, MD  cholecalciferol (VITAMIN D) 1000 UNITS tablet Take 2,000 Units by mouth daily.    Yes Historical Provider, MD  Cranberry 450 MG CAPS Take 450 mg by mouth daily.   Yes Historical Provider, MD  GENTAMICIN SULFATE IJ Inject 250 mg into the vein every 36  (thirty-six) hours. 3 total administrations in 0.9% NSS   Yes Historical Provider, MD  Heparin Lock Flush (HEPARIN NICU/SCN FLUSH) 1 UNIT/ML SOLN Inject 0.5-1.7 mLs into the vein daily. 14 day course starting on 06/20/15 (5ml)   Yes Historical Provider, MD  HYDROcodone-acetaminophen (NORCO/VICODIN) 5-325 MG tablet Take 1 tablet by mouth every 12 (twelve) hours.   Yes Historical Provider, MD  insulin aspart (NOVOLOG) 100 UNIT/ML injection Inject 2-5 Units into the skin 4 (four) times daily - after meals and at bedtime. Per sliding. 200-250= 2 units >70 call MD; 251-300 = 3 units; 301-350 = 4 units; 351-400 = 5 units; <400 call MD.   Yes Historical Provider, MD  lisinopril (PRINIVIL,ZESTRIL) 20 MG tablet Take 20 mg by mouth daily.   Yes Historical Provider, MD  lovastatin (MEVACOR) 40 MG tablet Take 60 mg by mouth at bedtime.    Yes Historical Provider, MD  metFORMIN (GLUCOPHAGE-XR) 500 MG 24 hr tablet Take 1,500 mg by mouth daily with breakfast.   Yes Historical Provider, MD  omeprazole (PRILOSEC) 20 MG capsule Take 20 mg by mouth daily. 05/26/12  Yes Joselyn Arrow, NP  senna-docusate (SENNA PLUS) 8.6-50 MG tablet Take 1 tablet by mouth 2 (two) times daily.   Yes Historical Provider, MD  sitaGLIPtin (JANUVIA) 100 MG tablet Take 100  mg by mouth daily.   Yes Historical Provider, MD  sodium chloride 0.45 % Inject into the vein See admin instructions. 4650ml/hr every shift for abnormal labs for 2 liters   Yes Historical Provider, MD  trimethoprim (TRIMPEX) 100 MG tablet Take 100 mg by mouth daily.   Yes Historical Provider, MD    Allergies:   Allergies  Allergen Reactions  . Penicillins     REACTION: unknown reaction: MAR reported    Social History:  reports that she has never smoked. She does not have any smokeless tobacco history on file. She reports that she does not drink alcohol or use illicit drugs.  Family History: Unknown due to pt dementia  Physical Exam: Filed Vitals:   07/04/15  1940 07/04/15 2038 07/04/15 2109 07/04/15 2110  BP: 182/65   139/70  Pulse: 101     Temp:   96.7 F (35.9 C)   TempSrc:   Rectal   Resp: 25   23  Weight:  45.36 kg (100 lb)    SpO2: 100%   90%   General appearance: alert, cooperative, no distress and slowed mentation very dry Head: Normocephalic, without obvious abnormality, atraumatic Eyes: negative Nose: Nares normal. Septum midline. Mucosa normal. No drainage or sinus tenderness. Neck: no JVD and supple, symmetrical, trachea midline Lungs: clear to auscultation bilaterally Heart: regular rate and rhythm, S1, S2 normal, no murmur, click, rub or gallop Abdomen: soft, non-tender; bowel sounds normal; no masses,  no organomegaly Extremities: extremities normal, atraumatic, no cyanosis or edema Pulses: 2+ and symmetric Skin: Skin color, texture, turgor normal. No rashes or lesions Neurologic:  CN grossly intact, pleasantly demented    Labs on Admission:   Recent Labs  07/04/15 1856  NA 140  K 4.4  CL 103  CO2 9*  GLUCOSE 199*  BUN 89*  CREATININE 5.88*  CALCIUM 8.0*    Recent Labs  07/04/15 1856  AST 35  ALT <5*  ALKPHOS 63  BILITOT 0.5  PROT 6.5  ALBUMIN 2.5*     Recent Labs  07/04/15 1856  WBC 14.9*  NEUTROABS 13.5*  HGB 11.2*  HCT 34.0*  MCV 89.2  PLT 371    Recent Labs  07/04/15 1856  TROPONINI 0.03   Radiological Exams on Admission: Dg Chest Port 1 View  07/04/2015  CLINICAL DATA:  PICC line placement.  Acute renal failure. EXAM: PORTABLE CHEST 1 VIEW COMPARISON:  04/21/2013 FINDINGS: Right PICC line tip:  SVC.  No pneumothorax. Borderline enlargement of the cardiopericardial silhouette with atherosclerotic aortic arch. The lungs appear clear. IMPRESSION: 1. Right PICC line tip:  SVC.  No complicating feature. 2. Borderline enlargement of the cardiopericardial silhouette. 3. Atherosclerotic aortic arch. Electronically Signed   By: Gaylyn RongWalter  Liebkemann M.D.   On: 07/04/2015 18:52   Old chart  reviewed cxr reviewed no edema or infiltrate Case discussed with dr Richrd Primemcmannus ekg reviewed rbbb old c/w previous ekgs  Assessment/Plan  82 yo demented female with dehydration, recent uti on gentamycin, acute kidney injury with metabolic and lactic acidosis  Principal Problem:   Acute renal failure (HCC)- likely due to combination of prerenal azotemia in the setting of infection and ace inhibitor.  Given 2 liters of ivf.  Give amp of bicarb.  Give another liter of ivf.  Check gentamycin level.  Check urine studies.  Repeat bmp at midnight tonight.  Active Problems:   Essential hypertension- hold bp meds   PERIPHERAL VASCULAR DISEASE- noted   Dehydration- noted   h/o  Stroke   CAD (coronary artery disease)   DM (diabetes mellitus), type 2, uncontrolled, periph vascular complic (HCC)-  Ssi, hold metformihn   UTI (lower urinary tract infection)- with possible sepsis, think elevation of lactic acid level is due to several issues, but cannot r/o component of sepsis.  Panculture.  Broad abx at this time vanc/leva/azactam.  procalcitonin level is normal, which points to other etiologies contributing to lactic acidosis.  Repeat level q 3 hours.   Dementia   Metabolic acidosis- due to renal failure, ivf   Lactic acidosis- ivf, treat underlyling infection.  Hold metformin   Buttock wound- noted  Admit to tele.  Full code per family wishes discussed in ED by EDP.  Caleah Tortorelli A 07/04/2015, 9:51 PM

## 2015-07-04 NOTE — ED Notes (Signed)
Family at bedside reporting pt has had dry heaves and nausea for 2 days.

## 2015-07-04 NOTE — ED Notes (Signed)
Almira CoasterGina, house supervisor at bedside to assist with sepsis protocol. Steward DroneBrenda, RN charge nurse at bedside to assist as well.

## 2015-07-05 ENCOUNTER — Encounter (HOSPITAL_COMMUNITY): Payer: Self-pay

## 2015-07-05 ENCOUNTER — Inpatient Hospital Stay (HOSPITAL_COMMUNITY): Payer: Medicare Other

## 2015-07-05 DIAGNOSIS — I10 Essential (primary) hypertension: Secondary | ICD-10-CM

## 2015-07-05 DIAGNOSIS — N390 Urinary tract infection, site not specified: Secondary | ICD-10-CM

## 2015-07-05 DIAGNOSIS — E1151 Type 2 diabetes mellitus with diabetic peripheral angiopathy without gangrene: Secondary | ICD-10-CM

## 2015-07-05 DIAGNOSIS — E1165 Type 2 diabetes mellitus with hyperglycemia: Secondary | ICD-10-CM

## 2015-07-05 LAB — BASIC METABOLIC PANEL
Anion gap: 18 — ABNORMAL HIGH (ref 5–15)
Anion gap: 17 — ABNORMAL HIGH (ref 5–15)
BUN: 84 mg/dL — AB (ref 6–20)
BUN: 83 mg/dL — AB (ref 6–20)
CALCIUM: 7 mg/dL — AB (ref 8.9–10.3)
CHLORIDE: 107 mmol/L (ref 101–111)
CO2: 12 mmol/L — AB (ref 22–32)
CO2: 13 mmol/L — ABNORMAL LOW (ref 22–32)
CREATININE: 5.33 mg/dL — AB (ref 0.44–1.00)
CREATININE: 5.37 mg/dL — AB (ref 0.44–1.00)
Calcium: 7 mg/dL — ABNORMAL LOW (ref 8.9–10.3)
Chloride: 110 mmol/L (ref 101–111)
GFR calc Af Amer: 8 mL/min — ABNORMAL LOW (ref >60)
GFR calc Af Amer: 8 mL/min — ABNORMAL LOW (ref >60)
GFR calc non Af Amer: 7 mL/min — ABNORMAL LOW (ref >60)
GFR, EST NON AFRICAN AMERICAN: 7 mL/min — AB (ref >60)
GLUCOSE: 139 mg/dL — AB (ref 65–99)
Glucose, Bld: 108 mg/dL — ABNORMAL HIGH (ref 65–99)
POTASSIUM: 3.9 mmol/L (ref 3.5–5.1)
Potassium: 4 mmol/L (ref 3.5–5.1)
SODIUM: 140 mmol/L (ref 135–145)
Sodium: 137 mmol/L (ref 135–145)

## 2015-07-05 LAB — GLUCOSE, CAPILLARY
GLUCOSE-CAPILLARY: 103 mg/dL — AB (ref 65–99)
GLUCOSE-CAPILLARY: 114 mg/dL — AB (ref 65–99)
GLUCOSE-CAPILLARY: 141 mg/dL — AB (ref 65–99)
Glucose-Capillary: 115 mg/dL — ABNORMAL HIGH (ref 65–99)
Glucose-Capillary: 110 mg/dL — ABNORMAL HIGH (ref 65–99)

## 2015-07-05 LAB — CBC
HCT: 29.9 % — ABNORMAL LOW (ref 36.0–46.0)
HEMATOCRIT: 29.3 % — AB (ref 36.0–46.0)
HEMOGLOBIN: 10 g/dL — AB (ref 12.0–15.0)
Hemoglobin: 9.9 g/dL — ABNORMAL LOW (ref 12.0–15.0)
MCH: 29.6 pg (ref 26.0–34.0)
MCH: 29.4 pg (ref 26.0–34.0)
MCHC: 33.4 g/dL (ref 30.0–36.0)
MCHC: 33.8 g/dL (ref 30.0–36.0)
MCV: 88.5 fL (ref 78.0–100.0)
MCV: 86.9 fL (ref 78.0–100.0)
PLATELETS: 294 10*3/uL (ref 150–400)
Platelets: 274 10*3/uL (ref 150–400)
RBC: 3.38 MIL/uL — AB (ref 3.87–5.11)
RBC: 3.37 MIL/uL — ABNORMAL LOW (ref 3.87–5.11)
RDW: 13.5 % (ref 11.5–15.5)
RDW: 13.6 % (ref 11.5–15.5)
WBC: 15.7 10*3/uL — ABNORMAL HIGH (ref 4.0–10.5)
WBC: 14.4 10*3/uL — AB (ref 4.0–10.5)

## 2015-07-05 LAB — SODIUM, URINE, RANDOM
Sodium, Ur: 90 mmol/L
Sodium, Ur: 90 mmol/L

## 2015-07-05 LAB — MRSA PCR SCREENING: MRSA by PCR: NEGATIVE

## 2015-07-05 LAB — LACTIC ACID, PLASMA
LACTIC ACID, VENOUS: 3.5 mmol/L — AB (ref 0.5–2.0)
LACTIC ACID, VENOUS: 2.5 mmol/L — AB (ref 0.5–2.0)

## 2015-07-05 LAB — OSMOLALITY: OSMOLALITY: 320 mosm/kg — AB (ref 275–295)

## 2015-07-05 LAB — GENTAMICIN LEVEL, RANDOM: GENTAMICIN RM: 13.1 ug/mL — AB

## 2015-07-05 LAB — CREATININE, URINE, RANDOM: CREATININE, URINE: 24.81 mg/dL

## 2015-07-05 LAB — OSMOLALITY, URINE: Osmolality, Ur: 348 mOsm/kg (ref 300–900)

## 2015-07-05 MED ORDER — VANCOMYCIN HCL IN DEXTROSE 1-5 GM/200ML-% IV SOLN
1000.0000 mg | INTRAVENOUS | Status: DC
  Administered 2015-07-06: 1000 mg via INTRAVENOUS
  Filled 2015-07-05 (×2): qty 200

## 2015-07-05 MED ORDER — LEVOFLOXACIN IN D5W 500 MG/100ML IV SOLN
500.0000 mg | INTRAVENOUS | Status: DC
  Administered 2015-07-06: 500 mg via INTRAVENOUS
  Filled 2015-07-05 (×2): qty 100

## 2015-07-05 MED ORDER — AZTREONAM 1 G IJ SOLR
1.0000 g | Freq: Two times a day (BID) | INTRAMUSCULAR | Status: DC
  Administered 2015-07-05: 1 g via INTRAVENOUS
  Filled 2015-07-05 (×5): qty 1

## 2015-07-05 MED ORDER — VANCOMYCIN HCL IN DEXTROSE 1-5 GM/200ML-% IV SOLN: 1000.0000 mg | INTRAVENOUS | Status: DC

## 2015-07-05 MED ORDER — PROCHLORPERAZINE EDISYLATE 5 MG/ML IJ SOLN
10.0000 mg | Freq: Four times a day (QID) | INTRAMUSCULAR | Status: DC | PRN
  Administered 2015-07-05 – 2015-07-06 (×2): 10 mg via INTRAVENOUS
  Filled 2015-07-05 (×2): qty 2

## 2015-07-05 NOTE — Progress Notes (Signed)
Critical Lab Lactic Acid 2.5. Trending down. MD notified and aware. Lesly Dukesachel J Everett, RN

## 2015-07-05 NOTE — Progress Notes (Addendum)
Initial Nutrition Assessment  DOCUMENTATION CODES:  Not applicable  INTERVENTION:  Once diet advanced:  Ensure Enlive po BID, each supplement provides 350 kcal and 20 grams of protein  Consider Renal appropriate Mvi with MD discretion in setting of prolonged poor PO intake w/o major supplementation AND to provide adequate substrate for wound healing  Monitor PO intake, if patient is unable to meet >50% estimated needs orally, would recommend quick initiation of enteral nutrition due to wound and sepsis  NUTRITION DIAGNOSIS:  Increased nutrient needs related to acute illness as evidenced by estimated nutritional requirements for the conditions  GOAL:  Patient will meet greater than or equal to 90% of their needs   MONITOR:  Skin, Supplement acceptance, Diet advancement, Labs, PO intake  REASON FOR ASSESSMENT:  Malnutrition Screening Tool    ASSESSMENT:  82 y/o female PMHx HTN, CVA, PVD, DM, Dementia, s/p bilateral BKA recently started on gentamicin for UTI with E. Coli.Renal function worsened and the gentamicin was stopped, however pt continued w/ worsening renal failure. Pt w/ poor PO intake and was admitted for sepsis/UTI/ARF.   RD operating remotely. Pt w/ severe dementia and little information about patient PTA is available. It is noted that pt does not eat or drink well, unsure of how much this is objectively.   She has already been ordered the prostat. Though currently NPO.   Outpatient med list includes 30 ml ps BID, Vitamin D, magnesium, Senna, Prilosec.   NFPE: Unable to assess at this time  Labs reviewed: Lactic acid: 3.5, WBC:14.4, RBC: 3.37, h/h: 9.9/29.3, Calcium: 7.0, BUN/Creat: 83/5.37    Recent Labs Lab 07/04/15 1856 07/05/15 0135 07/05/15 0506  NA 140 137 140  K 4.4 4.0 3.9  CL 103 107 110  CO2 9* 12* 13*  BUN 89* 84* 83*  CREATININE 5.88* 5.33* 5.37*  CALCIUM 8.0* 7.0* 7.0*  GLUCOSE 199* 139* 108*   Diet Order:  Diet NPO time specified  Skin:  Per her snf, Buttock wound R buttock that is + for E Coli.   Last BM:  Unknown  Height:  Ht Readings from Last 1 Encounters:  09/21/13 5\' 4"  (1.626 m)  Unsure if this is pre-bilateral amputation  Weight:  Wt Readings from Last 1 Encounters:  07/05/15 115 lb 1.3 oz (52.2 kg)   Wt Readings from Last 10 Encounters:  07/05/15 115 lb 1.3 oz (52.2 kg)  01/11/14 100 lb (45.36 kg)  09/21/13 118 lb 11.2 oz (53.842 kg)  04/22/13 108 lb 0.4 oz (49 kg)  03/18/11 145 lb (65.772 kg)   Ideal Body Weight:  54.54 kg  BMI:  Adjusted Body mass index for bilateral bka is 22.7 kg/(m^2).  Estimated Nutritional Needs:  Kcal:  1550-1750 (30-34 kcal/kg bw) Protein:  73-84g (1.4-1.6g Pro)  Fluid:  1.6-1.8 liters fluid  EDUCATION NEEDS:  No education needs identified at this time  Christophe LouisNathan Franks RD, LDN Clinical Nutrition Pager: [phone] 07/05/2015 8:48 AM

## 2015-07-05 NOTE — Consult Note (Signed)
WOC wound consult note Reason for Consult:Stage 3 pressure ijury on the right ischial tuberosity.  Sacrum with evidence of previous pressure injury, no injury at present. Wound type:pressure Pressure Ulcer POA: Yes Measurement:2cm x 1.5cm with 1cm undermining from 3-9 o'clock Wound bed:red, moist, non-granulating Drainage (amount, consistency, odor) serous to light yellow, no odor Periwound: intact.  Sacral area with evidence of previous wound healing, scar. Dressing procedure/placement/frequency: I will provide Nursing with guidance for application of a silver hydrofiber (Aquacel Ag+) that will absorb exudate and donate antimicrobial features to reduce bioburden and enhance wound healing.  Turning and repositining are in place per house protocol, but guidance is provided for Nursing for [facility] to reduce pressure on the ischial tuberosities in this patient with bilateral LE amputations. WOC nursing team will not follow, but will remain available to this patient, the nursing and medical teams.  Please re-consult if needed. Thanks, Ladona MowLaurie Indiana Pechacek, MSN, RN, GNP, Hans EdenCWOCN, CWON-AP, FAAN  Pager# [phone]

## 2015-07-05 NOTE — Progress Notes (Signed)
CRITICAL VALUE ALERT  Critical value received:  Lactic Acid 3.5  Date of notification:  07/05/15  Time of notification:  0739  Critical value read back:Yes.    Nurse who received alert:  Myrene Buddyachel Everett RN  MD notified (1st page):  MD Memon  Time of first page:  [phone]  MD notified (2nd page):  Time of second page:  Responding MD:  MD Kerry HoughMemon  Time MD responded:  0800

## 2015-07-05 NOTE — Progress Notes (Signed)
PROGRESS NOTE    [name]  FAO:[id number] DOB: 1933-03-22 DOA: 07/04/2015 PCP: [name], MD   Outpatient Specialists:  West Bali, MD, Gastroenterology.   Brief Narrative:  39 yof with a hx of dementia, HTN, CVA, chronic indwelling foley, PVD s/p bilateral BKA was recently started on gentamycin for a UTI with E. Coli / decubitus ulcer. It was noted that her renal function was abnormal and her Cr was elevated to 5. Her abx were stopped and she was started on IV hydration. Repeat labs showed worsening renal function, so she was brought to the ED. She does not eat or drink well and was very dehydrated. She was admitted to telemetry for sepsis/UTI/ARF.    Assessment & Plan:   Principal Problem:   Acute renal failure (HCC) Active Problems:   Essential hypertension   PERIPHERAL VASCULAR DISEASE   Dehydration   h/o Stroke   CAD (coronary artery disease)   DM (diabetes mellitus), type 2, uncontrolled, periph vascular complic (HCC)   UTI (lower urinary tract infection)   Dementia   Metabolic acidosis   Lactic acidosis   Buttock wound   1. Acute renal failure. Likely due to prerenal azotemia in the setting of infection and ACE inhibitor/ gentamycin. BUN 83, Cr 5.37. Continue IVF's and monitor urine output. Follow gentamycin level. Continue to monitor BMP. Will consult nephrology. 2. UTI with possible sepsis. Lactic acid elevation is possibly due to several issues, but sepsis cannot yet be ruled out. Procalcitonin level is normal which indicates other etiologies are contributing to lactic acidosis. WBC elevated. Lactic acid trending down, continue to trend. Continue broad spectrum abx. Follow urine and blood cx. 3. Metabolic acidosis. Due to renal failure. Continue IVFS. 4. Lactic acidosis. Anticipate resolution with tx of underlying infection. Continue IVFs. Hold metformin. 5. Dehydration. Anticipate resolution with IVFS. 6. Essential HTN. BP meds being held. 7. DM type 2. With  peripheral vascular complications. Uncontrolled. Continue SSI.  8. Decubitus ulcer on buttock. Will have wound care consult. Will order air mattress. Will continue on Vanc and levaquin for now. 9. Dementia. Stable.   DVT prophylaxis: Heparin Code Status: Full Family Communication: Family present at bedside Disposition Plan: Discharge in 2-3 days   Consultants:   Nephrology  Procedures:   none  Antimicrobials:   Levaquin 4/21 >>  Vanc 4/21 >>   Azactam 4/21 >> 4/22   Subjective: Feels fine today. She denies any pain and states that her breathing is fine. Per daughter, she was experiencing nausea over the night, but this has improved. Her current mental status is abnormal and is a bit more sleepy than normal.   Objective: Filed Vitals:   07/04/15 2110 07/04/15 2220 07/04/15 2250 07/05/15 0620  BP: 139/70 149/73 148/72 163/48  Pulse:  88 110 85  Temp:    97.8 F (36.6 C)  TempSrc:    Oral  Resp: Weight:   51.7 kg (113 lb 15.7 oz)   SpO2: 90% 100% 96% 100%    Intake/Output Summary (Last 24 hours) at 07/05/15 0650 Last data filed at 07/05/15 6962  Gross per 24 hour  Intake    500 ml  Output    350 ml  Net    150 ml   Filed Weights   07/04/15 2038 07/04/15 2250  Weight: 45.36 kg (100 lb) 51.7 kg (113 lb 15.7 oz)   Examination:  General exam: Appears calm and comfortable  Respiratory system: Clear to auscultation. Respiratory  effort normal. Cardiovascular system: S1 & S2 heard, RRR. No JVD, murmurs, rubs, gallops or clicks. No pedal edema. Gastrointestinal system: Abdomen is nondistended, soft and nontender. No organomegaly or masses felt. Normal bowel sounds heard. Central nervous system: Alert and oriented. No focal neurological deficits. Extremities: bilateral BKA Psychiatry: unable to assess due to dementia    Data Reviewed: I have personally reviewed following labs and imaging studies  CBC:  Recent Labs Lab 07/04/15 1856 07/05/15 0135  07/05/15 0506  WBC 14.9* 15.7* 14.4*  NEUTROABS 13.5*  --   --   HGB 11.2* 10.0* 9.9*  HCT 34.0* 29.9* 29.3*  MCV 89.2 88.5 86.9  PLT 371 294 274   Basic Metabolic Panel:  Recent Labs Lab 07/04/15 1856 07/05/15 0135 07/05/15 0506  NA 140 137 140  K 4.4 4.0 3.9  CL 103 107 110  CO2 9* 12* 13*  GLUCOSE 199* 139* 108*  BUN 89* 84* 83*  CREATININE 5.88* 5.33* 5.37*  CALCIUM 8.0* 7.0* 7.0*   GFR: CrCl cannot be calculated (Unknown ideal weight.). Liver Function Tests:  Recent Labs Lab 07/04/15 1856  AST 35  ALT <5*  ALKPHOS 63  BILITOT 0.5  PROT 6.5  ALBUMIN 2.5*   No results for input(s): LIPASE, AMYLASE in the last 168 hours. No results for input(s): AMMONIA in the last 168 hours. Coagulation Profile: No results for input(s): INR, PROTIME in the last 168 hours. Cardiac Enzymes:  Recent Labs Lab 07/04/15 1856  TROPONINI 0.03   BNP (last 3 results) No results for input(s): PROBNP in the last 8760 hours. HbA1C: No results for input(s): HGBA1C in the last 72 hours. CBG: No results for input(s): GLUCAP in the last 168 hours. Lipid Profile: No results for input(s): CHOL, HDL, LDLCALC, TRIG, CHOLHDL, LDLDIRECT in the last 72 hours. Thyroid Function Tests: No results for input(s): TSH, T4TOTAL, FREET4, T3FREE, THYROIDAB in the last 72 hours. Anemia Panel: No results for input(s): VITAMINB12, FOLATE, FERRITIN, TIBC, IRON, RETICCTPCT in the last 72 hours. Urine analysis:    Component Value Date/Time   COLORURINE YELLOW 07/04/2015 1945   APPEARANCEUR CLOUDY* 07/04/2015 1945   LABSPEC 1.025 07/04/2015 1945   PHURINE 5.5 07/04/2015 1945   GLUCOSEU 500* 07/04/2015 1945   HGBUR MODERATE* 07/04/2015 1945   BILIRUBINUR NEGATIVE 07/04/2015 1945   KETONESUR TRACE* 07/04/2015 1945   PROTEINUR 100* 07/04/2015 1945   UROBILINOGEN 0.2 01/11/2014 2247   NITRITE NEGATIVE 07/04/2015 1945   LEUKOCYTESUR MODERATE* 07/04/2015 1945   Sepsis  Labs: (procalcitonin:4,lacticidven:4)  ) Recent Results (from the past 240 hour(s))  Blood Culture (routine x 2)     Status: None (Preliminary result)   Collection Time: 07/04/15  6:56 PM  Result Value Ref Range Status   Specimen Description LEFT ANTECUBITAL  Final   Special Requests BOTTLES DRAWN AEROBIC ONLY 4CC ONLY  Final   Culture PENDING  Incomplete   Report Status PENDING  Incomplete  Blood Culture (routine x 2)     Status: None (Preliminary result)   Collection Time: 07/04/15  8:28 PM  Result Value Ref Range Status   Specimen Description BLOOD PICC LINE DRAW  Final   Special Requests BOTTLES DRAWN AEROBIC AND ANAEROBIC [facility] EACH  Final   Culture PENDING  Incomplete   Report Status PENDING  Incomplete  MRSA PCR Screening     Status: None   Collection Time: 07/05/15 12:40 AM  Result Value Ref Range Status   MRSA by PCR NEGATIVE NEGATIVE Final    Comment:  The GeneXpert MRSA Assay (FDA approved for NASAL specimens only), is one component of a comprehensive MRSA colonization surveillance program. It is not intended to diagnose MRSA infection nor to guide or monitor treatment for MRSA infections.          Radiology Studies: Dg Chest Port 1 View  07/04/2015  CLINICAL DATA:  PICC line placement.  Acute renal failure. EXAM: PORTABLE CHEST 1 VIEW COMPARISON:  04/21/2013 FINDINGS: Right PICC line tip:  SVC.  No pneumothorax. Borderline enlargement of the cardiopericardial silhouette with atherosclerotic aortic arch. The lungs appear clear. IMPRESSION: 1. Right PICC line tip:  SVC.  No complicating feature. 2. Borderline enlargement of the cardiopericardial silhouette. 3. Atherosclerotic aortic arch. Electronically Signed   By: Gaylyn RongWalter  Liebkemann M.D.   On: 07/04/2015 18:52        Scheduled Meds: . sodium chloride   Intravenous STAT  . aspirin EC  81 mg Oral Daily  . feeding supplement (PRO-STAT SUGAR FREE 64)  30 mL Oral BID  . heparin  5,000 Units  Subcutaneous Q8H  . HYDROcodone-acetaminophen  1 tablet Oral Q12H  . insulin aspart  0-9 Units Subcutaneous TID WC  . sodium chloride flush  3 mL Intravenous Q12H   Continuous Infusions: . sodium chloride 100 mL/hr at 07/05/15 0630     LOS: 1 day    Time spent: 25 minutes    Erick BlinksJehanzeb Abednego Yeates, MD [facility] Pager [phone]  If 7PM-7AM, please contact night-coverage [email or web address] Password [email or web address] 07/05/2015, 6:50 AM    By signing my name below, I, Adron BeneGreylon Gawaluck, attest that this documentation has been prepared under the direction and in the presence of Erick BlinksJehanzeb Consetta Cosner, MD. Electronically Signed: Adron BeneGreylon Gawaluck 07/05/2015 11:11am  I, Dr. Erick BlinksJehanzeb Kalla Watson, personally performed the services described in this documentaiton. All medical record entries made by the scribe were at my direction and in my presence. I have reviewed the chart and agree that the record reflects my personal performance and is accurate and complete  Erick BlinksJehanzeb Kyara Boxer, MD, 07/05/2015 11:42 AM

## 2015-07-05 NOTE — Progress Notes (Signed)
ANTIBIOTIC CONSULT NOTE-  Pharmacy Consult for Vancomycin and Levaquin Indication: sepsis / wound infection  Allergies  Allergen Reactions  . Penicillins     REACTION: unknown reaction: MAR reported   Patient Measurements: Weight: 115 lb 1.3 oz (52.2 kg)  Vital Signs: Temp: 97.8 F (36.6 C) (04/22 0620) Temp Source: Oral (04/22 0620) BP: 163/48 mmHg (04/22 0620) Pulse Rate: 85 (04/22 0620)  Labs:  Recent Labs  07/04/15 1856 07/05/15 0135 07/05/15 0506  WBC 14.9* 15.7* 14.4*  HGB 11.2* 10.0* 9.9*  PLT 371 294 274  CREATININE 5.88* 5.33* 5.37*   CrCl cannot be calculated (Unknown ideal weight.).   Recent Labs  07/04/15 1856  GENTRANDOM 13.1*  (was on Gentamicin PTA per notes, Natasha BenceGent has been d/c'd)   Microbiology: Recent Results (from the past 720 hour(s))  Blood Culture (routine x 2)     Status: None (Preliminary result)   Collection Time: 07/04/15  6:56 PM  Result Value Ref Range Status   Specimen Description LEFT ANTECUBITAL  Final   Special Requests BOTTLES DRAWN AEROBIC ONLY 4CC ONLY  Final   Culture PENDING  Incomplete   Report Status PENDING  Incomplete  Blood Culture (routine x 2)     Status: None (Preliminary result)   Collection Time: 07/04/15  8:28 PM  Result Value Ref Range Status   Specimen Description BLOOD PICC LINE DRAW  Final   Special Requests BOTTLES DRAWN AEROBIC AND ANAEROBIC [facility]6CC EACH  Final   Culture PENDING  Incomplete   Report Status PENDING  Incomplete  MRSA PCR Screening     Status: None   Collection Time: 07/05/15 12:40 AM  Result Value Ref Range Status   MRSA by PCR NEGATIVE NEGATIVE Final    Comment:        The GeneXpert MRSA Assay (FDA approved for NASAL specimens only), is one component of a comprehensive MRSA colonization surveillance program. It is not intended to diagnose MRSA infection nor to guide or monitor treatment for MRSA infections.    Medical History: Past Medical History  Diagnosis Date  . HTN  (hypertension)   . Stroke (HCC)   . Diabetes mellitus without complication (HCC)   . Embolism (HCC)   . PVD (peripheral vascular disease) (HCC)   . Dementia    Assessment: 82yo female admitted with ARF and UTI / sepsis.  SCr elevated.  Small body habitus.   MRSA PCR (-).  Plan:   Vancomycin 1000mg  IV q48hrs   Check trough at steady state  Levaquin 500mg  IV q48hrs  Monitor labs, renal fxn, progress and c/s  Wayland DenisHall, Vibhav Waddill A, RPH 07/05/2015,11:42 AM

## 2015-07-05 NOTE — Progress Notes (Signed)
ANTIBIOTIC CONSULT NOTE-  Pharmacy Consult for Vancomycin, Levaquin, Aztreonam Indication: sepsis  Allergies  Allergen Reactions  . Penicillins     REACTION: unknown reaction: MAR reported   Patient Measurements: Weight: 115 lb 1.3 oz (52.2 kg)  Vital Signs: Temp: 97.8 F (36.6 C) (04/22 0620) Temp Source: Oral (04/22 0620) BP: 163/48 mmHg (04/22 0620) Pulse Rate: 85 (04/22 0620)  Labs:  Recent Labs  07/04/15 1856 07/05/15 0135 07/05/15 0506  WBC 14.9* 15.7* 14.4*  HGB 11.2* 10.0* 9.9*  PLT 371 294 274  CREATININE 5.88* 5.33* 5.37*   CrCl cannot be calculated (Unknown ideal weight.).   Recent Labs  07/04/15 1856  GENTRANDOM 13.1*  (was on Gentamicin PTA per notes, Natasha BenceGent has been d/c'd)   Microbiology: Recent Results (from the past 720 hour(s))  Blood Culture (routine x 2)     Status: None (Preliminary result)   Collection Time: 07/04/15  6:56 PM  Result Value Ref Range Status   Specimen Description LEFT ANTECUBITAL  Final   Special Requests BOTTLES DRAWN AEROBIC ONLY 4CC ONLY  Final   Culture PENDING  Incomplete   Report Status PENDING  Incomplete  Blood Culture (routine x 2)     Status: None (Preliminary result)   Collection Time: 07/04/15  8:28 PM  Result Value Ref Range Status   Specimen Description BLOOD PICC LINE DRAW  Final   Special Requests BOTTLES DRAWN AEROBIC AND ANAEROBIC [facility]6CC EACH  Final   Culture PENDING  Incomplete   Report Status PENDING  Incomplete  MRSA PCR Screening     Status: None   Collection Time: 07/05/15 12:40 AM  Result Value Ref Range Status   MRSA by PCR NEGATIVE NEGATIVE Final    Comment:        The GeneXpert MRSA Assay (FDA approved for NASAL specimens only), is one component of a comprehensive MRSA colonization surveillance program. It is not intended to diagnose MRSA infection nor to guide or monitor treatment for MRSA infections.    Medical History: Past Medical History  Diagnosis Date  . HTN (hypertension)    . Stroke (HCC)   . Diabetes mellitus without complication (HCC)   . Embolism (HCC)   . PVD (peripheral vascular disease) (HCC)   . Dementia    Assessment: 82yo female admitted with ARF and UTI / sepsis.  SCr elevated.  Small body habitus.   MRSA PCR (-).  Plan:   Vancomycin 1000mg  IV q48hrs   Check trough at steady state  Aztreonam 1gm IV q12hrs  Levaquin 500mg  IV q48hrs  Consider d/c Aztreonam (duplicate gm (-) coverage w/ Levaquin)  Monitor labs, renal fxn, progress and c/s  Wayland DenisHall, Caley Volkert A, RPH 07/05/2015,7:54 AM

## 2015-07-05 NOTE — Progress Notes (Signed)
Pt was given zofran at 0630. Next dose not due til 1230. Pt dry heaving and is nauseated. MD notified and ordered compazine 10mg IV Q6 PRN. RN will continue to monitor. Briggette Najarian J Everett, RN  

## 2015-07-05 NOTE — Consult Note (Signed)
Reason for Consult: Acute kidney injury Referring Physician: Dr. Erskin Burnet [name] is an 82 y.o. female.  HPI: She is a patient who has history of hypertension, diabetes, CVA, presently admitted with history of acute kidney injury. Patient has this moment doesn't seem to be alert and doesn't respond to questions. Hence unable to get much information. According to her note patient seems to have indwelling Foley catheter and she has been treated for Escherichia coli UTI. Patient doesn't seem to have previous history of renal failure. Her last creatinine which was in 2015 seems to be normal.   Past Medical History  Diagnosis Date  . HTN (hypertension)   . Stroke ([place])   . Diabetes mellitus without complication ([place])   . Embolism ([place])   . PVD (peripheral vascular disease) ([place])   . Dementia     Past Surgical History  Procedure Laterality Date  . Below knee leg amputation      left  . 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 Lump removed      from left breast  . S/p hysterectomy    . Cholecystectomy    . Esophagogastroduodenoscopy  09/23/08    SLF:no barrett's ring ,mass or stricture    History reviewed. No pertinent family history.  Social History:  reports that she has never smoked. She does not have any smokeless tobacco history on file. She reports that she does not drink alcohol or use illicit drugs.  Allergies:  Allergies  Allergen Reactions  . Penicillins     REACTION: unknown reaction: MAR reported    Medications: I have reviewed the patient's current medications.  Results for orders placed or performed during the hospital encounter of 07/04/15 (from the past 48 hour(s))  Comprehensive metabolic panel     Status: Abnormal   Collection Time: 07/04/15  6:56 PM  Result Value Ref Range   Sodium 140 135 - 145 mmol/L   Potassium 4.4 3.5 - 5.1 mmol/L   Chloride 103 101 - 111 mmol/L   CO2 9 (L) 22 - 32 mmol/L   Glucose, Bld 199 (H) 65 - 99  mg/dL   BUN 89 (H) 6 - 20 mg/dL   Creatinine, Ser 5.88 (H) 0.44 - 1.00 mg/dL   Calcium 8.0 (L) 8.9 - 10.3 mg/dL   Total Protein 6.5 6.5 - 8.1 g/dL   Albumin 2.5 (L) 3.5 - 5.0 g/dL   AST 35 15 - 41 U/L   ALT <5 (L) 14 - 54 U/L   Alkaline Phosphatase 63 38 - 126 U/L   Total Bilirubin 0.5 0.3 - 1.2 mg/dL   GFR calc non Af Amer 6 (L) >60 mL/min   GFR calc Af Amer 7 (L) >60 mL/min    Comment: (NOTE) The eGFR has been calculated using the CKD EPI equation. This calculation has not been validated in all clinical situations. eGFR's persistently <60 mL/min signify possible Chronic Kidney Disease.    Anion gap 28 (H) 5 - 15  Troponin I     Status: None   Collection Time: 07/04/15  6:56 PM  Result Value Ref Range   Troponin I 0.03 <0.031 ng/mL    Comment:        NO INDICATION OF MYOCARDIAL INJURY.   Lactic acid, plasma     Status: Abnormal   Collection Time: 07/04/15  6:56 PM  Result Value Ref Range   Lactic Acid, Venous 11.3 (HH) 0.5 - 2.0  mmol/L    Comment: CRITICAL RESULT CALLED TO, READ BACK BY AND VERIFIED WITH:  NORMAN,B @ 0950 ON 07/04/15 BY WOODIE,J   CBC with Differential     Status: Abnormal   Collection Time: 07/04/15  6:56 PM  Result Value Ref Range   WBC 14.9 (H) 4.0 - 10.5 K/uL   RBC 3.81 (L) 3.87 - 5.11 MIL/uL   Hemoglobin 11.2 (L) 12.0 - 15.0 g/dL   HCT 34.0 (L) 36.0 - 46.0 %   MCV 89.2 78.0 - 100.0 fL   MCH 29.4 26.0 - 34.0 pg   MCHC 32.9 30.0 - 36.0 g/dL   RDW 13.6 11.5 - 15.5 %   Platelets 371 150 - 400 K/uL   Neutrophils Relative % 91 %   Neutro Abs 13.5 (H) 1.7 - 7.7 K/uL   Lymphocytes Relative 6 %   Lymphs Abs 1.0 0.7 - 4.0 K/uL   Monocytes Relative 3 %   Monocytes Absolute 0.5 0.1 - 1.0 K/uL   Eosinophils Relative 0 %   Eosinophils Absolute 0.0 0.0 - 0.7 K/uL   Basophils Relative 0 %   Basophils Absolute 0.0 0.0 - 0.1 K/uL  Blood Culture (routine x 2)     Status: None (Preliminary result)   Collection Time: 07/04/15  6:56 PM  Result Value Ref Range    Specimen Description LEFT ANTECUBITAL    Special Requests BOTTLES DRAWN AEROBIC ONLY 4CC ONLY    Culture PENDING    Report Status PENDING   Gentamicin level, random     Status: Abnormal   Collection Time: 07/04/15  6:56 PM  Result Value Ref Range   Gentamicin Rm 13.1 (HH) ug/mL    Comment:        Random Gentamicin therapeutic range is dependent on dosage and time of specimen collection. A peak range is 5.0-10.0 ug/mL A trough range is 0.5-2.0 ug/mL        RESULTS CONFIRMED BY MANUAL DILUTION REPEATED TO VERIFY CRITICAL RESULT CALLED TO, READ BACK BY AND VERIFIED WITH: RN VELOZARIE,W @ 0131 ON 07/05/15 BY FULKS,C Performed at [facility]   Urinalysis, Routine w reflex microscopic     Status: Abnormal   Collection Time: 07/04/15  7:45 PM  Result Value Ref Range   Color, Urine YELLOW YELLOW   APPearance CLOUDY (A) CLEAR   Specific Gravity, Urine 1.025 1.005 - 1.030   pH 5.5 5.0 - 8.0   Glucose, UA 500 (A) NEGATIVE mg/dL   Hgb urine dipstick MODERATE (A) NEGATIVE   Bilirubin Urine NEGATIVE NEGATIVE   Ketones, ur TRACE (A) NEGATIVE mg/dL   Protein, ur 100 (A) NEGATIVE mg/dL   Nitrite NEGATIVE NEGATIVE   Leukocytes, UA MODERATE (A) NEGATIVE  Urine microscopic-add on     Status: Abnormal   Collection Time: 07/04/15  7:45 PM  Result Value Ref Range   Squamous Epithelial / LPF 6-30 (A) NONE SEEN   WBC, UA TOO NUMEROUS TO COUNT 0 - 5 WBC/hpf   RBC / HPF TOO NUMEROUS TO COUNT 0 - 5 RBC/hpf   Bacteria, UA MANY (A) NONE SEEN  Blood Culture (routine x 2)     Status: None (Preliminary result)   Collection Time: 07/04/15  8:28 PM  Result Value Ref Range   Specimen Description BLOOD PICC LINE DRAW    Special Requests BOTTLES DRAWN AEROBIC AND ANAEROBIC 6CC EACH    Culture PENDING    Report Status PENDING   Lactic acid, plasma     Status:  Abnormal   Collection Time: 07/04/15  9:31 PM  Result Value Ref Range   Lactic Acid, Venous 9.6 (HH) 0.5 - 2.0 mmol/L     Comment: CRITICAL RESULT CALLED TO, READ BACK BY AND VERIFIED WITH: NORMAN,B AT 2219 ON 07/04/2015 BY AGUNDIZ,E.   Procalcitonin - Baseline     Status: None   Collection Time: 07/04/15  9:31 PM  Result Value Ref Range   Procalcitonin <0.10 ng/mL    Comment:        Interpretation: PCT (Procalcitonin) <= 0.5 ng/mL: Systemic infection (sepsis) is not likely. Local bacterial infection is possible. (NOTE)         ICU PCT Algorithm               Non ICU PCT Algorithm    ----------------------------     ------------------------------         PCT < 0.25 ng/mL                 PCT < 0.1 ng/mL     Stopping of antibiotics            Stopping of antibiotics       strongly encouraged.               strongly encouraged.    ----------------------------     ------------------------------       PCT level decrease by               PCT < 0.25 ng/mL       >= 80% from peak PCT       OR PCT 0.25 - 0.5 ng/mL          Stopping of antibiotics                                             encouraged.     Stopping of antibiotics           encouraged.    ----------------------------     ------------------------------       PCT level decrease by              PCT >= 0.25 ng/mL       < 80% from peak PCT        AND PCT >= 0.5 ng/mL            Continuin g antibiotics                                              encouraged.       Continuing antibiotics            encouraged.    ----------------------------     ------------------------------     PCT level increase compared          PCT > 0.5 ng/mL         with peak PCT AND          PCT >= 0.5 ng/mL             Escalation of antibiotics  strongly encouraged.      Escalation of antibiotics        strongly encouraged.   Blood gas, arterial     Status: Abnormal   Collection Time: 07/04/15 10:14 PM  Result Value Ref Range   FIO2 0.21    Delivery systems ROOM AIR    pH, Arterial 7.229 (L) 7.350 - 7.450   pCO2 arterial 21.5  (L) 35.0 - 45.0 mmHg   pO2, Arterial 104.0 (H) 80.0 - 100.0 mmHg   Bicarbonate 11.5 (L) 20.0 - 24.0 mEq/L   Acid-base deficit 17.6 (H) 0.0 - 2.0 mmol/L   O2 Saturation 96.6 %   Collection site RADIAL    Drawn by [id number]    Sample type ARTERIAL    Allens test (pass/fail) NOT INDICATED (A) PASS  Glucose, capillary     Status: Abnormal   Collection Time: 07/05/15 12:28 AM  Result Value Ref Range   Glucose-Capillary 141 (H) 65 - 99 mg/dL  Sodium, urine, random     Status: None   Collection Time: 07/05/15 12:40 AM  Result Value Ref Range   Sodium, Ur 90 mmol/L  MRSA PCR Screening     Status: None   Collection Time: 07/05/15 12:40 AM  Result Value Ref Range   MRSA by PCR NEGATIVE NEGATIVE    Comment:        The GeneXpert MRSA Assay (FDA approved for NASAL specimens only), is one component of a comprehensive MRSA colonization surveillance program. It is not intended to diagnose MRSA infection nor to guide or monitor treatment for MRSA infections.   CBC     Status: Abnormal   Collection Time: 07/05/15  1:35 AM  Result Value Ref Range   WBC 15.7 (H) 4.0 - 10.5 K/uL   RBC 3.38 (L) 3.87 - 5.11 MIL/uL   Hemoglobin 10.0 (L) 12.0 - 15.0 g/dL   HCT 29.9 (L) 36.0 - 46.0 %   MCV 88.5 78.0 - 100.0 fL   MCH 29.6 26.0 - 34.0 pg   MCHC 33.4 30.0 - 36.0 g/dL   RDW 13.5 11.5 - 15.5 %   Platelets 294 150 - 400 K/uL  Basic metabolic panel     Status: Abnormal   Collection Time: 07/05/15  1:35 AM  Result Value Ref Range   Sodium 137 135 - 145 mmol/L   Potassium 4.0 3.5 - 5.1 mmol/L   Chloride 107 101 - 111 mmol/L   CO2 12 (L) 22 - 32 mmol/L   Glucose, Bld 139 (H) 65 - 99 mg/dL   BUN 84 (H) 6 - 20 mg/dL   Creatinine, Ser 5.33 (H) 0.44 - 1.00 mg/dL   Calcium 7.0 (L) 8.9 - 10.3 mg/dL   GFR calc non Af Amer 7 (L) >60 mL/min   GFR calc Af Amer 8 (L) >60 mL/min    Comment: (NOTE) The eGFR has been calculated using the CKD EPI equation. This calculation has not been validated in all clinical  situations. eGFR's persistently <60 mL/min signify possible Chronic Kidney Disease.    Anion gap 18 (H) 5 - 15  Basic metabolic panel     Status: Abnormal   Collection Time: 07/05/15  5:06 AM  Result Value Ref Range   Sodium 140 135 - 145 mmol/L   Potassium 3.9 3.5 - 5.1 mmol/L   Chloride 110 101 - 111 mmol/L   CO2 13 (L) 22 - 32 mmol/L   Glucose, Bld 108 (H) 65 - 99 mg/dL   BUN 83 (H)  6 - 20 mg/dL   Creatinine, Ser 5.37 (H) 0.44 - 1.00 mg/dL   Calcium 7.0 (L) 8.9 - 10.3 mg/dL   GFR calc non Af Amer 7 (L) >60 mL/min   GFR calc Af Amer 8 (L) >60 mL/min    Comment: (NOTE) The eGFR has been calculated using the CKD EPI equation. This calculation has not been validated in all clinical situations. eGFR's persistently <60 mL/min signify possible Chronic Kidney Disease.    Anion gap 17 (H) 5 - 15  CBC     Status: Abnormal   Collection Time: 07/05/15  5:06 AM  Result Value Ref Range   WBC 14.4 (H) 4.0 - 10.5 K/uL   RBC 3.37 (L) 3.87 - 5.11 MIL/uL   Hemoglobin 9.9 (L) 12.0 - 15.0 g/dL   HCT 29.3 (L) 36.0 - 46.0 %   MCV 86.9 78.0 - 100.0 fL   MCH 29.4 26.0 - 34.0 pg   MCHC 33.8 30.0 - 36.0 g/dL   RDW 13.6 11.5 - 15.5 %   Platelets 274 150 - 400 K/uL  Lactic acid, plasma     Status: Abnormal   Collection Time: 07/05/15  6:31 AM  Result Value Ref Range   Lactic Acid, Venous 3.5 (HH) 0.5 - 2.0 mmol/L    Comment: SLIGHT HEMOLYSIS CRITICAL RESULT CALLED TO, READ BACK BY AND VERIFIED WITH: EVERETT,R AT 0738 ON 07/05/15 BY MOSLEY,J   Glucose, capillary     Status: Abnormal   Collection Time: 07/05/15  8:14 AM  Result Value Ref Range   Glucose-Capillary 103 (H) 65 - 99 mg/dL  Lactic acid, plasma     Status: Abnormal   Collection Time: 07/05/15  9:04 AM  Result Value Ref Range   Lactic Acid, Venous 2.5 (HH) 0.5 - 2.0 mmol/L    Comment: CRITICAL RESULT CALLED TO, READ BACK BY AND VERIFIED WITH: EVERETT,R AT 0939 ON 07/05/15 BY MOSLEY,J   Glucose, capillary     Status: Abnormal    Collection Time: 07/05/15 11:44 AM  Result Value Ref Range   Glucose-Capillary 114 (H) 65 - 99 mg/dL   Comment 1 Notify RN     Dg Chest Port 1 View  07/04/2015  CLINICAL DATA:  PICC line placement.  Acute renal failure. EXAM: PORTABLE CHEST 1 VIEW COMPARISON:  04/21/2013 FINDINGS: Right PICC line tip:  SVC.  No pneumothorax. Borderline enlargement of the cardiopericardial silhouette with atherosclerotic aortic arch. The lungs appear clear. IMPRESSION: 1. Right PICC line tip:  SVC.  No complicating feature. 2. Borderline enlargement of the cardiopericardial silhouette. 3. Atherosclerotic aortic arch. Electronically Signed   By: Van Clines M.D.   On: 07/04/2015 18:52    Review of Systems  Unable to perform ROS: dementia   Blood pressure 163/48, pulse 85, temperature 97.8 F (36.6 C), temperature source Oral, resp. rate 18, weight 52.2 kg (115 lb 1.3 oz), SpO2 100 %. Physical Exam  Constitutional: No distress.  HENT:  Mouth/Throat: No oropharyngeal exudate.  Eyes: No scleral icterus.  Neck: No JVD present.  Cardiovascular: Normal rate and regular rhythm.   Respiratory: No respiratory distress. She has no wheezes.  Musculoskeletal: She exhibits no edema.  Bilateral BKA  Neurological:  Patient arousable but doesn't communicate    Assessment/Plan: Problem #1 acute kidney injury: Etiology seems to be multifactorial including medications such as gentamicin/ACE/trimethoprim. Patient is nonoliguric and her creatinine showing slight improvement. Problem #2 low CO2: Possibly metabolic Problem #3 anemia. Problem #4 history of UTI. Patient is presently  afebrile however her white blood cell count seems to be high. Problem #5 history of diabetes Problem #6 history of hypertension her blood pressure is reasonably controlled Problem #7 history of CVA: Possible dementia Problem #8 lactic acidosis: Most likely secondary to Glucophage. Presently seems to be improving Plan: 1] Agree with  discontinuation of Glucophage/gentamicin/lisinopril 2] will check urine for sodium and creatinine 3] will increase IV fluid to 125 cc per hour 4] will do ultrasound of the kidneys. 5] will check renal panel in the morning.   Felix Meras S 07/05/2015, 12:04 PM

## 2015-07-05 NOTE — Progress Notes (Signed)
Critical lab gentamicin 13.1 [facility]. Provider notified.

## 2015-07-06 ENCOUNTER — Inpatient Hospital Stay (HOSPITAL_COMMUNITY): Payer: Medicare Other

## 2015-07-06 LAB — RENAL FUNCTION PANEL
ANION GAP: 13 (ref 5–15)
Albumin: 2.1 g/dL — ABNORMAL LOW (ref 3.5–5.0)
BUN: 88 mg/dL — ABNORMAL HIGH (ref 6–20)
CALCIUM: 6.8 mg/dL — AB (ref 8.9–10.3)
CHLORIDE: 116 mmol/L — AB (ref 101–111)
CO2: 15 mmol/L — AB (ref 22–32)
CREATININE: 5.63 mg/dL — AB (ref 0.44–1.00)
GFR calc non Af Amer: 6 mL/min — ABNORMAL LOW (ref >60)
GFR, EST AFRICAN AMERICAN: 7 mL/min — AB (ref >60)
GLUCOSE: 161 mg/dL — AB (ref 65–99)
Phosphorus: 5.2 mg/dL — ABNORMAL HIGH (ref 2.5–4.6)
Potassium: 3.3 mmol/L — ABNORMAL LOW (ref 3.5–5.1)
SODIUM: 144 mmol/L (ref 135–145)

## 2015-07-06 LAB — CBC
HCT: 27.6 % — ABNORMAL LOW (ref 36.0–46.0)
Hemoglobin: 9.7 g/dL — ABNORMAL LOW (ref 12.0–15.0)
MCH: 30 pg (ref 26.0–34.0)
MCHC: 35.1 g/dL (ref 30.0–36.0)
MCV: 85.4 fL (ref 78.0–100.0)
PLATELETS: 243 10*3/uL (ref 150–400)
RBC: 3.23 MIL/uL — AB (ref 3.87–5.11)
RDW: 13.5 % (ref 11.5–15.5)
WBC: 11.2 10*3/uL — ABNORMAL HIGH (ref 4.0–10.5)

## 2015-07-06 LAB — PROCALCITONIN: Procalcitonin: <0.1 ng/mL

## 2015-07-06 LAB — GLUCOSE, CAPILLARY
GLUCOSE-CAPILLARY: 106 mg/dL — AB (ref 65–99)
Glucose-Capillary: 145 mg/dL — ABNORMAL HIGH (ref 65–99)

## 2015-07-06 MED ORDER — NEPRO/CARBSTEADY PO LIQD
237.0000 mL | Freq: Three times a day (TID) | ORAL | Status: DC
  Administered 2015-07-06 – 2015-07-20 (×16): 237 mL via ORAL

## 2015-07-06 MED ORDER — METOPROLOL TARTRATE 25 MG PO TABS
25.0000 mg | ORAL_TABLET | Freq: Two times a day (BID) | ORAL | Status: DC
  Administered 2015-07-06 – 2015-07-13 (×15): 25 mg via ORAL
  Filled 2015-07-06 (×15): qty 1

## 2015-07-06 MED ORDER — POTASSIUM CHLORIDE 10 MEQ/100ML IV SOLN
10.0000 meq | INTRAVENOUS | Status: AC
  Administered 2015-07-06 (×2): 10 meq via INTRAVENOUS
  Filled 2015-07-06: qty 100

## 2015-07-06 MED ORDER — SODIUM CHLORIDE 0.45 % IV SOLN
INTRAVENOUS | Status: DC
  Administered 2015-07-06 – 2015-07-07 (×3): via INTRAVENOUS
  Filled 2015-07-06 (×7): qty 1000

## 2015-07-06 NOTE — Progress Notes (Signed)
Pt's family had concern about pt being NPO and no intake. RN spoke with MD about family's concern. MD explained the risk of aspiration for the pt due to not coherent or alert. RN relayed message back to family member and Family member agreed and understood risks. RN will continue to monitor. Lesly Dukesachel J Everett, RN

## 2015-07-06 NOTE — Progress Notes (Signed)
PROGRESS NOTE    [name]  ZOX:[id number] DOB: November 24, 1933 DOA: 07/04/2015 PCP: Dwana MelenaZack Hall, MD   Outpatient Specialists:   West BaliSandi L Fields, MD, Gastroenterology.   Brief Narrative:  [age] yof with a hx of dementia, HTN, CVA, chronic indwelling foley, PVD s/p bilateral BKA was recently started on gentamycin for a UTI with E. Coli / decubitus ulcer. It was noted that her renal function was abnormal and her Cr was elevated to 5. Her abx were stopped and she was started on IV hydration. Repeat labs showed worsening renal function, so she was brought to the ED. She does not eat or drink well and was very dehydrated. She was admitted to telemetry for sepsis/UTI/ARF.    Assessment & Plan:   Principal Problem:   Acute renal failure (HCC) Active Problems:   Essential hypertension   PERIPHERAL VASCULAR DISEASE   Dehydration   h/o Stroke   CAD (coronary artery disease)   DM (diabetes mellitus), type 2, uncontrolled, periph vascular complic (HCC)   UTI (lower urinary tract infection)   Dementia   Metabolic acidosis   Lactic acidosis   Buttock wound   1. Acute renal failure. Likely due to prerenal azotemia in the setting of infection and ACE inhibitor/ gentamycin. BUN 83, Cr 5.37. Continue IVF's and monitor urine output. Continue to monitor BMP. Appreciate nephrology input. 2. UTI with possible sepsis. Blood cultures 1/2 positive for gram positive cocci. Lactic acid elevation is possibly due to several issues, but sepsis cannot yet be ruled out. Procalcitonin level is normal which indicates other etiologies are contributing to lactic acidosis. WBC and lactic acid trending down. Continue broad spectrum abx.  3. Gram positive cocci bacteremia, revealed in 1/2 cultures. Awaiting results of second culture to determine if this is a true infection. Follow up sensitives. Continue abx.  4. Hypokalemia, replace.  5. Metabolic acidosis. Due to renal failure. Continue IVFS. 6. Lactic acidosis. Anticipate  resolution with tx of underlying infection. Continue IVFs. Hold metformin. 7. Dehydration. Anticipate resolution with IVFS. 8. Essential HTN. BP trending up. Will start on metoprolol  Continue to hold ace-inhibitor.  9. DM type 2. With peripheral vascular complications. Uncontrolled. Continue SSI.  10. Stage III Decubitus ulcer on buttock, present on admisison. Does not appear to be actively infected. Continue wound care.  . 11. Dementia. Stable.   DVT prophylaxis: Heparin Code Status: Full Family Communication: Family present at bedside Disposition Plan: Discharge in 1-2 days  Consultants:   Nephrology  WOC  Procedures:   none  Antimicrobials:   Levaquin 4/21 >>  Vanc 4/21 >>   Azactam 4/21 >> 4/22   Subjective: History difficult to obtain from patient.  Per family she has been sleeping a lot and mild episode of dry heaving this morning. Overnight was not able to get much sleep. Was able to tolerate medications when mixed with grape juice and applesauce. Noted to gag with applesauce. Prior to arrival was able to eat regular diet without difficulty. Family request a different antiemetic for longer lasting coverage.   Objective: Filed Vitals:   07/05/15 0620 07/05/15 1511 07/05/15 2016 07/06/15 0649  BP: 163/48 144/82 166/70 166/83  Pulse: 85 83 81 85  Temp: 97.8 F (36.6 C) 98.1 F (36.7 C) 98.1 F (36.7 C) 98 F (36.7 C)  TempSrc: Oral  Axillary Axillary  Resp: 18 18 16 16   Weight: 52.2 kg (115 lb 1.3 oz)   50.7 kg (111 lb 12.4 oz)  SpO2: 100% 100% 100% 100%  Intake/Output Summary (Last 24 hours) at 07/06/15 1036 Last data filed at 07/06/15 0935  Gross per 24 hour  Intake 1197.08 ml  Output   1200 ml  Net  -2.92 ml   Filed Weights   07/04/15 2250 07/05/15 0620 07/06/15 0649  Weight: 51.7 kg (113 lb 15.7 oz) 52.2 kg (115 lb 1.3 oz) 50.7 kg (111 lb 12.4 oz)   Examination:  General exam: NAD  Respiratory system: Clear to auscultation. Respiratory  effort normal. Cardiovascular system: S1 & S2 heard, RRR. No JVD, murmurs, rubs, gallops or clicks. No pedal edema. Gastrointestinal system: Abdomen is nondistended, soft and nontender. No organomegaly or masses felt. Normal bowel sounds heard. Central nervous system:Somnlent, no gross focal deficits.  Extremities: bilateral BKA Psychiatry: unable to assess due to dementia    Data Reviewed: I have personally reviewed following labs and imaging studies  CBC:  Recent Labs Lab 07/04/15 1856 07/05/15 0135 07/05/15 0506 07/06/15 0515  WBC 14.9* 15.7* 14.4* 11.2*  NEUTROABS 13.5*  --   --   --   HGB 11.2* 10.0* 9.9* 9.7*  HCT 34.0* 29.9* 29.3* 27.6*  MCV 89.2 88.5 86.9 85.4  PLT 371 294 274 243   Basic Metabolic Panel:  Recent Labs Lab 07/04/15 1856 07/05/15 0135 07/05/15 0506 07/06/15 0515  NA 140 137 140 144  K 4.4 4.0 3.9 3.3*  CL 103 107 110 116*  CO2 9* 12* 13* 15*  GLUCOSE 199* 139* 108* 161*  BUN 89* 84* 83* 88*  CREATININE 5.88* 5.33* 5.37* 5.63*  CALCIUM 8.0* 7.0* 7.0* 6.8*  PHOS  --   --   --  5.2*   GFR: CrCl cannot be calculated (Unknown ideal weight.). Liver Function Tests:  Recent Labs Lab 07/04/15 1856 07/06/15 0515  AST 35  --   ALT <5*  --   ALKPHOS 63  --   BILITOT 0.5  --   PROT 6.5  --   ALBUMIN 2.5* 2.1*     Recent Labs Lab 07/04/15 1856  TROPONINI 0.03   CBG:  Recent Labs Lab 07/05/15 0814 07/05/15 1144 07/05/15 1631 07/05/15 2103 07/06/15 0744  GLUCAP 103* 114* 115* 110* 145*   Urine analysis:    Component Value Date/Time   COLORURINE YELLOW 07/04/2015 1945   APPEARANCEUR CLOUDY* 07/04/2015 1945   LABSPEC 1.025 07/04/2015 1945   PHURINE 5.5 07/04/2015 1945   GLUCOSEU 500* 07/04/2015 1945   HGBUR MODERATE* 07/04/2015 1945   BILIRUBINUR NEGATIVE 07/04/2015 1945   KETONESUR TRACE* 07/04/2015 1945   PROTEINUR 100* 07/04/2015 1945   UROBILINOGEN 0.2 01/11/2014 2247   NITRITE NEGATIVE 07/04/2015 1945   LEUKOCYTESUR  MODERATE* 07/04/2015 1945   Sepsis Labs: (procalcitonin:4,lacticidven:4)  ) Recent Results (from the past 240 hour(s))  Blood Culture (routine x 2)     Status: None (Preliminary result)   Collection Time: 07/04/15  6:56 PM  Result Value Ref Range Status   Specimen Description LEFT ANTECUBITAL  Final   Special Requests BOTTLES DRAWN AEROBIC ONLY 4CC ONLY  Final   Culture PENDING  Incomplete   Report Status PENDING  Incomplete  Blood Culture (routine x 2)     Status: None (Preliminary result)   Collection Time: 07/04/15  8:28 PM  Result Value Ref Range Status   Specimen Description BLOOD PICC LINE DRAW  Final   Special Requests BOTTLES DRAWN AEROBIC AND ANAEROBIC [facility] EACH  Final   Culture  Setup Time   Final    GRAM POSITIVE COCCI Gram Stain Report  Called to,Read Back By and Verified With: BULILNS,L AT 1910 ON 07/05/2015 BY AGUNDIZ,E. GRAM POSITIVE COCCI Gram Stain Report Called to,Read Back By and Verified With: MCGIBBONEY,C. AT 1930 ON 07/05/2015 BY AGUNDIZ,E.    Culture   Final    GRAM POSITIVE COCCI CULTURE REINCUBATED FOR BETTER GROWTH Performed at [facility]    Report Status PENDING  Incomplete  MRSA PCR Screening     Status: None   Collection Time: 07/05/15 12:40 AM  Result Value Ref Range Status   MRSA by PCR NEGATIVE NEGATIVE Final    Comment:        The GeneXpert MRSA Assay (FDA approved for NASAL specimens only), is one component of a comprehensive MRSA colonization surveillance program. It is not intended to diagnose MRSA infection nor to guide or monitor treatment for MRSA infections.          Radiology Studies: Dg Chest Port 1 View  07/04/2015  CLINICAL DATA:  PICC line placement.  Acute renal failure. EXAM: PORTABLE CHEST 1 VIEW COMPARISON:  04/21/2013 FINDINGS: Right PICC line tip:  SVC.  No pneumothorax. Borderline enlargement of the cardiopericardial silhouette with atherosclerotic aortic arch. The lungs appear clear. IMPRESSION:  1. Right PICC line tip:  SVC.  No complicating feature. 2. Borderline enlargement of the cardiopericardial silhouette. 3. Atherosclerotic aortic arch. Electronically Signed   By: Gaylyn Rong M.D.   On: 07/04/2015 18:52        Scheduled Meds: . aspirin EC  81 mg Oral Daily  . feeding supplement (PRO-STAT SUGAR FREE 64)  30 mL Oral BID  . heparin  5,000 Units Subcutaneous Q8H  . HYDROcodone-acetaminophen  1 tablet Oral Q12H  . insulin aspart  0-9 Units Subcutaneous TID WC  . levofloxacin (LEVAQUIN) IV  500 mg Intravenous Q48H  . sodium chloride flush  3 mL Intravenous Q12H  . vancomycin  1,000 mg Intravenous Q48H   Continuous Infusions: . sodium chloride 125 mL/hr at 07/06/15 0229     LOS: 2 days    Time spent: 25 minutes    Erick Blinks, MD [facility] Pager [phone]  If 7PM-7AM, please contact night-coverage [email or web address] Password [facility] 07/06/2015, 10:36 AM   . By signing my name below, I, Zadie Cleverly, attest that this documentation has been prepared under the direction and in the presence of Erick Blinks, MD. Electronically signed: Zadie Cleverly, Scribe. 07/06/2015 1:19pm   I, Dr. Erick Blinks, personally performed the services described in this documentaiton. All medical record entries made by the scribe were at my direction and in my presence. I have reviewed the chart and agree that the record reflects my personal performance and is accurate and complete  Erick Blinks, MD, 07/06/2015 1:39 PM

## 2015-07-06 NOTE — Progress Notes (Addendum)
Subjective: Interval History: has no complaint of nausea or vomiting. Patient seems to be healing. She denies also any difficulty in breathing..  Objective: Vital signs in last 24 hours: Temp:  [98 F (36.7 C)-98.1 F (36.7 C)] 98 F (36.7 C) (04/23 0649) Pulse Rate:  [81-85] 85 (04/23 0649) Resp:  [16-18] 16 (04/23 0649) BP: (144-166)/(70-83) 166/83 mmHg (04/23 0649) SpO2:  [100 %] 100 % (04/23 0649) Weight:  [50.7 kg (111 lb 12.4 oz)] 50.7 kg (111 lb 12.4 oz) (04/23 0649) Weight change: 5.34 kg (11 lb 12.4 oz)  Intake/Output from previous day: 04/22 0701 - 04/23 0700 In: 1197.1 [I.V.:1197.1] Out: 1200 [Urine:1200] Intake/Output this shift:    Generally patient is alert and in no apparent distress Chest is clear to auscultation Heart exam regular rate and rhythm Extremities no edema  Lab Results:  Recent Labs  07/05/15 0506 07/06/15 0515  WBC 14.4* 11.2*  HGB 9.9* 9.7*  HCT 29.3* 27.6*  PLT 274 243   BMET:  Recent Labs  07/05/15 0506 07/06/15 0515  NA 140 144  K 3.9 3.3*  CL 110 116*  CO2 13* 15*  GLUCOSE 108* 161*  BUN 83* 88*  CREATININE 5.37* 5.63*  CALCIUM 7.0* 6.8*   No results for input(s): PTH in the last 72 hours. Iron Studies: No results for input(s): IRON, TIBC, TRANSFERRIN, FERRITIN in the last 72 hours.  Studies/Results: Dg Chest Port 1 View  07/04/2015  CLINICAL DATA:  PICC line placement.  Acute renal failure. EXAM: PORTABLE CHEST 1 VIEW COMPARISON:  04/21/2013 FINDINGS: Right PICC line tip:  SVC.  No pneumothorax. Borderline enlargement of the cardiopericardial silhouette with atherosclerotic aortic arch. The lungs appear clear. IMPRESSION: 1. Right PICC line tip:  SVC.  No complicating feature. 2. Borderline enlargement of the cardiopericardial silhouette. 3. Atherosclerotic aortic arch. Electronically Signed   By: Gaylyn RongWalter  Liebkemann M.D.   On: 07/04/2015 18:52    I have reviewed the patient's current  medications.  Assessment/Plan: Problem #1 acute kidney injury superimposed on chronic. Thought to be secondary to gentamicin/ACE. Presently her BUN and creatinine seems to be increasing. Patient also vancomycin possibly that might have attributed to her problem. Patient presently is nonoliguric Problem #2 hypokalemia: Her potassium is low Problem #3. Low CO2: Possibly metabolic. Presently improving. Problem #4 anemia Problem #5 history of diabetes Problem #6 urinary tract infection patient on antibiotics: Presently she is afebrile and her white blood cell count is improving. Problem #7 history of CVA Problem #8. Metabolic bone disease her calcium is low and phosphorus is slightly high. Plan: 1]We'll change her IV fluid to half normal saline with [facility] to 135 mL per hour 2] we'll check her renal panel in the morning. 3] Nepro 1 can po bid   LOS: 2 days   Darbi Chandran S 07/06/2015,10:41 AM

## 2015-07-07 LAB — RENAL FUNCTION PANEL
Albumin: 1.9 g/dL — ABNORMAL LOW (ref 3.5–5.0)
Anion gap: 11 (ref 5–15)
BUN: 84 mg/dL — AB (ref 6–20)
CHLORIDE: 113 mmol/L — AB (ref 101–111)
CO2: 14 mmol/L — ABNORMAL LOW (ref 22–32)
Calcium: 6.5 mg/dL — ABNORMAL LOW (ref 8.9–10.3)
Creatinine, Ser: 5.51 mg/dL — ABNORMAL HIGH (ref 0.44–1.00)
GFR calc Af Amer: 8 mL/min — ABNORMAL LOW (ref >60)
GFR, EST NON AFRICAN AMERICAN: 7 mL/min — AB (ref >60)
Glucose, Bld: 197 mg/dL — ABNORMAL HIGH (ref 65–99)
POTASSIUM: 3.5 mmol/L (ref 3.5–5.1)
Phosphorus: 4.2 mg/dL (ref 2.5–4.6)
Sodium: 138 mmol/L (ref 135–145)

## 2015-07-07 LAB — GLUCOSE, CAPILLARY
GLUCOSE-CAPILLARY: 101 mg/dL — AB (ref 65–99)
GLUCOSE-CAPILLARY: 144 mg/dL — AB (ref 65–99)
GLUCOSE-CAPILLARY: 47 mg/dL — AB (ref 65–99)
Glucose-Capillary: 133 mg/dL — ABNORMAL HIGH (ref 65–99)

## 2015-07-07 LAB — URINE CULTURE

## 2015-07-07 MED ORDER — AMLODIPINE BESYLATE 5 MG PO TABS
5.0000 mg | ORAL_TABLET | Freq: Every day | ORAL | Status: DC
  Administered 2015-07-07 – 2015-07-21 (×14): 5 mg via ORAL
  Filled 2015-07-07 (×15): qty 1

## 2015-07-07 MED ORDER — SODIUM BICARBONATE 650 MG PO TABS
650.0000 mg | ORAL_TABLET | Freq: Two times a day (BID) | ORAL | Status: DC
  Administered 2015-07-07 (×2): 650 mg via ORAL
  Filled 2015-07-07 (×2): qty 1

## 2015-07-07 NOTE — Progress Notes (Signed)
Hypoglycemic Event  CBG: 46  Treatment: 15 GM carbohydrate snack  Symptoms: None  Follow-up CBG: Time:2315 CBG Result:144  Possible Reasons for Event: Inadequate meal intake  Comments/MD notified: yes    [name]

## 2015-07-07 NOTE — NC FL2 (Signed)
[place] MEDICAID FL2 LEVEL OF CARE SCREENING TOOL     IDENTIFICATION  Patient Name: Sara GuarneriHelen W Pellow Birthdate: 1933/07/25 Sex: female Admission Date (Current Location): 07/04/2015  [facility] [name]   Facility and Address:  [facility],  [address], [name]      Provider Number: [id number]  Attending Physician Name and Address:  Erick BlinksJehanzeb Memon, MD  Relative Name and Phone Number:       Current Level of Care: Hospital Recommended Level of Care: Skilled Nursing Facility Prior Approval Number:    Date Approved/Denied:   PASRR Number: [id number]  Discharge Plan: SNF    Current Diagnoses: Patient Active Problem List   Diagnosis Date Noted  . Metabolic acidosis 07/04/2015  . Lactic acidosis 07/04/2015  . Dementia   . Buttock wound   . Encephalopathy, metabolic Toxic 09/24/2013  . UTI (urinary tract infection) 09/21/2013  . Hypothermia 09/21/2013  . UTI (lower urinary tract infection) 09/21/2013  . DM (diabetes mellitus), type 2, uncontrolled, periph vascular complic (HCC) 04/22/2013  . Acute renal failure (HCC) 04/21/2013  . Dehydration 04/21/2013  . Hyperosmolar non-ketotic state in patient with type 2 diabetes mellitus (HCC) 04/21/2013  . CAD (coronary artery disease) 04/21/2013  . Hyperglycemia 04/21/2013  . Dysphagia 04/21/2013  . h/o Stroke   . GERD (gastroesophageal reflux disease) 03/18/2011  . GASTROPARESIS 10/27/2009  . HYPERLIPIDEMIA 11/05/2008  . DEPRESSION 11/05/2008  . Essential hypertension 11/05/2008  . PERIPHERAL VASCULAR DISEASE 11/05/2008  . COPD 11/05/2008  . GI BLEEDING 11/05/2008  . NAUSEA AND VOMITING 11/05/2008    Orientation RESPIRATION BLADDER Height & Weight     Self  Normal Indwelling catheter Weight: 109 lb 9.1 oz (49.7 kg) Height:     BEHAVIORAL SYMPTOMS/MOOD NEUROLOGICAL BOWEL NUTRITION STATUS  Other (Comment) (n/a)  (n/a) Incontinent Diet (Dysphagia 3 with thin liquids)   AMBULATORY STATUS COMMUNICATION OF NEEDS Skin   Total Care Verbally  (Stage III to right ischial tuberosity with foam dressing. Redness to sacrum.)                       Personal Care Assistance Level of Assistance  Total care           Functional Limitations Info  Sight, Hearing, Speech Sight Info: Adequate Hearing Info: Adequate Speech Info: Impaired    SPECIAL CARE FACTORS FREQUENCY                       Contractures      Additional Factors Info  Insulin Sliding Scale Code Status Info: Full code Allergies Info: Penicillins           Current Medications (07/07/2015):  This is the current hospital active medication list Current Facility-Administered Medications  Medication Dose Route Frequency Provider Last Rate Last Dose  . 0.9 %  sodium chloride infusion   Intravenous Continuous Salomon MastBelayenh Befekadu, MD 125 mL/hr at 07/06/15 0229    . aspirin EC tablet 81 mg  81 mg Oral Daily Haydee Monicaachal A David, MD   81 mg at 07/07/15 [phone]  . feeding supplement (NEPRO CARB STEADY) liquid 237 mL  237 mL Oral TID WC Salomon MastBelayenh Befekadu, MD   237 mL at 07/07/15 0914  . feeding supplement (PRO-STAT SUGAR FREE 64) liquid 30 mL  30 mL Oral BID Haydee Monicaachal A David, MD   30 mL at 07/07/15 0917  . heparin injection 5,000 Units  5,000 Units Subcutaneous Q8H Rachal A  Onalee Hua, MD   5,000 Units at 07/06/15 2201  . HYDROcodone-acetaminophen (NORCO/VICODIN) 5-325 MG per tablet 1 tablet  1 tablet Oral Q12H Haydee Monica, MD   1 tablet at 07/07/15 [phone]  . insulin aspart (novoLOG) injection 0-9 Units  0-9 Units Subcutaneous TID WC Haydee Monica, MD   2 Units at 07/07/15 0915  . levofloxacin (LEVAQUIN) IVPB 500 mg  500 mg Intravenous Q48H Erick Blinks, MD   500 mg at 07/06/15 1951  . metoprolol tartrate (LOPRESSOR) tablet 25 mg  25 mg Oral BID Erick Blinks, MD   25 mg at 07/07/15 9604  . ondansetron (ZOFRAN) tablet 4 mg  4 mg Oral Q6H PRN Haydee Monica, MD       Or  . ondansetron [facility] injection  4 mg  4 mg Intravenous Q6H PRN Haydee Monica, MD   4 mg at 07/06/15 0021  . prochlorperazine (COMPAZINE) injection 10 mg  10 mg Intravenous Q6H PRN Erick Blinks, MD   10 mg at 07/06/15 1525  . sodium bicarbonate tablet 650 mg  650 mg Oral BID Salomon Mast, MD   650 mg at 07/07/15 0917  . sodium chloride 0.45 % 1,000 mL with potassium chloride 10 mEq infusion   Intravenous Continuous Salomon Mast, MD 135 mL/hr at 07/07/15 0703    . sodium chloride flush (NS) 0.9 % injection 3 mL  3 mL Intravenous Q12H Haydee Monica, MD   3 mL at 07/07/15 0931  . vancomycin (VANCOCIN) IVPB 1000 mg/200 mL premix  1,000 mg Intravenous Q48H Erick Blinks, MD   1,000 mg at 07/06/15 5409     Discharge Medications: Please see discharge summary for a list of discharge medications.  Relevant Imaging Results:  Relevant Lab Results:   Additional Information SS#: [id number]  [place], [place] [phone]

## 2015-07-07 NOTE — Clinical Social Work Note (Signed)
Clinical Social Work Assessment  Patient Details  Name: [name] MRN: [id number] Date of Birth: 1933-11-04  Date of referral:  07/07/15               Reason for consult:  Discharge Planning                Permission sought to share information with:    Permission granted to share information::     Name::        Agency::     Relationship::     Contact Information:     Housing/Transportation Living arrangements for the past 2 months:  Skilled Nursing Facility Source of Information:  Adult Children Patient Interpreter Needed:  None Criminal Activity/Legal Involvement Pertinent to Current Situation/Hospitalization:  No - Comment as needed Significant Relationships:  Adult Children Lives with:  Facility Resident Do you feel safe going back to the place where you live?  Yes Need for family participation in patient care:  Yes (Comment)  Care giving concerns:  [facility]    Social Worker assessment / plan:  CSW attempted to meet with pt at bedside, but pt sleeping soundly. CSW spoke with pt's daughter, [name] on phone. She states that pt was admitted on Friday and she spent all weekend with pt here. Pt has been a resident at [facility] for over [facility] years. Family lives locally and visit frequently. Per [name] at facility, pt is nursing level of care and okay to return. She requires extensive assist with ADLs.   Employment status:  Retired Health and safety inspectornsurance information:  Medicare PT Recommendations:  Not assessed at this time Information / Referral to community resources:  Other (Comment Required) (Return to [facility])  Patient/Family's Response to care:  Pt's daughter requests return to [facility].   Patient/Family's Understanding of and Emotional Response to Diagnosis, Current Treatment, and Prognosis:  Pt's daughter indicates she spent all weekend here and is up to date on admission diagnosis and treatment plan.   Emotional Assessment Appearance:  Appears stated  age Attitude/Demeanor/Rapport:  Unable to Assess Affect (typically observed):  Unable to Assess Orientation:  Oriented to Self Alcohol / Substance use:  Not Applicable Psych involvement (Current and /or in the community):  No (Comment)  Discharge Needs  Concerns to be addressed:  Discharge Planning Concerns Readmission within the last 30 days:  No Current discharge risk:  None Barriers to Discharge:  Continued Medical Work up   Karn CassisStultz, [name], LCSW 07/07/2015, 10:11 AM [phone]

## 2015-07-07 NOTE — Care Management Note (Signed)
Case Management Note  Patient Details  Name: Marnee GuarneriHelen W Merkel MRN: [id number] Date of Birth: 09-Oct-1933  Subjective/Objective:                  Pt admitted with Acute Renal Failure. Pt is from [facility] where she is a LT resident. Anticipate pt will return to facility when appropriate. CSW is aware of DC plan and will make arrangements for return to facility.  Action/Plan: No CM needs anticipated.   Expected Discharge Date:     07/08/2015             Expected Discharge Plan:  Skilled Nursing Facility  In-House Referral:  Clinical Social Work  Discharge planning Services  CM Consult  Post Acute Care Choice:  NA Choice offered to:  NA  DME Arranged:    DME Agency:     HH Arranged:    HH Agency:     Status of Service:  Completed, signed off  Medicare Important Message Given:    Date Medicare IM Given:    Medicare IM give by:    Date Additional Medicare IM Given:    Additional Medicare Important Message give by:     If discussed at Long Length of Stay Meetings, dates discussed:    Additional Comments:  Malcolm MetroChildress, Vincent Ehrler Demske, RN 07/07/2015, 2:43 PM

## 2015-07-07 NOTE — Progress Notes (Addendum)
Subjective: Interval History: Patient offers no complaints.  Objective: Vital signs in last 24 hours: Temp:  [98 F (36.7 C)-98.2 F (36.8 C)] 98 F (36.7 C) (04/24 0654) Pulse Rate:  [52-90] 52 (04/24 0654) Resp:  [16-20] 20 (04/24 0654) BP: (159-168)/(53-81) 168/61 mmHg (04/24 0654) SpO2:  [98 %-100 %] 100 % (04/24 0654) Weight:  [49.7 kg (109 lb 9.1 oz)] 49.7 kg (109 lb 9.1 oz) (04/24 0654) Weight change: -1 kg (-2 lb 3.3 oz)  Intake/Output from previous day: 04/23 0701 - 04/24 0700 In: 2538.8 [P.O.:120; I.V.:2218.8; IV Piggyback:200] Out: 1550 [Urine:1550] Intake/Output this shift:   Generally patient is alert and in no apparent distress Chest is clear to auscultation Heart examination regular rate and rhythm no murmur or S3 Extremities: She is status post bilateral BKA and no edema   Lab Results:  Recent Labs  07/05/15 0506 07/06/15 0515  WBC 14.4* 11.2*  HGB 9.9* 9.7*  HCT 29.3* 27.6*  PLT 274 243   BMET:   Recent Labs  07/06/15 0515 07/07/15 0455  NA 144 138  K 3.3* 3.5  CL 116* 113*  CO2 15* 14*  GLUCOSE 161* 197*  BUN 88* 84*  CREATININE 5.63* 5.51*  CALCIUM 6.8* 6.5*   No results for input(s): PTH in the last 72 hours. Iron Studies: No results for input(s): IRON, TIBC, TRANSFERRIN, FERRITIN in the last 72 hours.  Studies/Results: [place] Renal  07/06/2015  CLINICAL DATA:  Acute kidney injury, UTI EXAM: RENAL / URINARY TRACT ULTRASOUND COMPLETE COMPARISON:  CT abdomen pelvis dated 12/18/2009 FINDINGS: Right Kidney: Length: 11.1 cm. Echogenic renal parenchyma, suggesting medical renal disease. No hydronephrosis. Left Kidney: Length: 10.9 cm. Echogenic renal parenchyma, suggesting medical renal disease. No hydronephrosis. Bladder: Decompressed by indwelling Foley catheter. IMPRESSION: Echogenic renal parenchyma, suggesting medical renal disease. No hydronephrosis. Bladder decompressed by indwelling Foley catheter. Electronically Signed   By: Charline BillsSriyesh   Krishnan M.D.   On: 07/06/2015 11:30    I have reviewed the patient's current medications.  Assessment/Plan: Problem #1 acute kidney injury superimposed on chronic. Thought to be secondary to gentamicin/ACE. Her renal function showed a slight improvement. Patient presently non-oliguric. Her 24-hour urine output is 1500. Problem #2 hypokalemia: Her potassium has normalized. Problem #3. Low CO2: Possibly metabolic. Her CO2 remained slow. Problem #4 anemia: Her hemoglobin is below our target goal. Etiology as this moment is no clear. Problem #5 history of diabetes Problem #6 urinary tract infection patient on antibiotics: Presently she is afebrile and her white blood cell count is improving. Patient on antibiotics Problem #7 history of CVA Problem #8. Metabolic bone disease her calcium is low and phosphorus is slightly high. Plan: 1]We'll continue his present management 2] we'll start patient on sodium bicarbonate 650 mg by mouth twice a day 3] check her renal panel in the morning.   LOS: 3 days   Janiqua Friscia S 07/07/2015,7:39 AM

## 2015-07-07 NOTE — Progress Notes (Signed)
PROGRESS NOTE    Marnee GuarneriHelen W Stretch  JYN:[id number] DOB: [birth year] DOA: 07/04/2015 PCP: Dwana MelenaZack Hall, MD   Outpatient Specialists:   West BaliSandi L Fields, MD, Gastroenterology.  Brief Narrative:  [age] yof with a hx of dementia, HTN, CVA, chronic indwelling foley, PVD s/p bilateral BKA was recently started on gentamycin for a UTI with E. Coli / decubitus ulcer. It was noted that her renal function was abnormal and her Cr was elevated to 5. Her abx were stopped and she was started on IV hydration. Repeat labs showed worsening renal function, so she was brought to the ED. She does not eat or drink well and was very dehydrated. She was admitted to telemetry for sepsis/UTI/ARF.    Assessment & Plan:   Principal Problem:   Acute renal failure (HCC) Active Problems:   Essential hypertension   PERIPHERAL VASCULAR DISEASE   Dehydration   h/o Stroke   CAD (coronary artery disease)   DM (diabetes mellitus), type 2, uncontrolled, periph vascular complic (HCC)   UTI (lower urinary tract infection)   Dementia   Metabolic acidosis   Lactic acidosis   Buttock wound  1. Acute renal failure. Likely due to prerenal azotemia in the setting of infection and ACE inhibitor/ gentamycin.  Continue IVF's and monitor urine output. Continue to monitor BMP. Appreciate nephrology input. 2. UTI with possible sepsis. Blood cultures positive for gram positive cocci. Lactic acid elevation is possibly due to several issues, but sepsis cannot yet be ruled out. Procalcitonin level is normal which indicates other etiologies are contributing to lactic acidosis. WBC and lactic acid trending down. Continue broad spectrum abx. Follow up ur 3. Gram positive cocci bacteremia, 1/2 cultures positive for coagulase negative staph and enterococcus. This culture was drawn from PICC line. PICC line will likely need to be removed. Will discuss further treatment with ID. Follow up sensitives. Continue abx.  4. Hypokalemia, resolved.  5. Metabolic  acidosis. Due to renal failure. Continue IVFs. 6. Lactic acidosis. Anticipate resolution with tx of underlying infection. Continue IVFs. Hold metformin. 7. Dehydration. Anticipate resolution with IVFs. 8. Essential HTN. BP remains elevated despite metoprolol. Will add norvasc  Continue to hold ace-inhibitor.  9. DM type 2. With peripheral vascular complications. Uncontrolled. Continue SSI.  10. Stage III Decubitus ulcer on buttock, present on admisison. Does not appear to be actively infected. Continue wound care.  . 11. Dementia. Stable.   DVT prophylaxis: Heparin Code Status: Full Family Communication: Family present at bedside Disposition Plan: Discharge in 1-2 days  Consultants:   Nephrology  WOC  Procedures:   none  Antimicrobials:   Levaquin 4/21 >>  Vanc 4/21 >>   Azactam 4/21 >> 4/22  Subjective: Unable to obtain due to patient's mental status.   Objective: Filed Vitals:   07/06/15 1400 07/06/15 2023 07/06/15 2122 07/07/15 0654  BP: 159/81  159/53 168/61  Pulse: 90  62 52  Temp: 98.2 F (36.8 C)  98.1 F (36.7 C) 98 F (36.7 C)  TempSrc:   Oral Oral  Resp: 16  20 20   Weight:    49.7 kg (109 lb 9.1 oz)  SpO2: 100% 98% 100% 100%    Intake/Output Summary (Last 24 hours) at 07/07/15 0828 Last data filed at 07/07/15 0655  Gross per 24 hour  Intake 2538.75 ml  Output   1550 ml  Net 988.75 ml   Filed Weights   07/05/15 0620 07/06/15 0649 07/07/15 0654  Weight: 52.2 kg (115 lb 1.3 oz) 50.7 kg (111  lb 12.4 oz) 49.7 kg (109 lb 9.1 oz)   Examination:  General exam: NAD. Awakes to voice but does not participate in the exam Respiratory system: Clear to auscultation. Respiratory effort normal. Cardiovascular system: RRR. No JVD, murmurs, rubs, gallops or clicks. No pedal edema. Gastrointestinal system: Abdomen is nondistended, soft and nontender. No organomegaly or masses felt. Normal bowel sounds heard. Central nervous system: No gross focal deficits.    Extremities: bilateral BKA Psychiatry: unable to assess due to dementia    Data Reviewed: I have personally reviewed following labs and imaging studies  CBC:  Recent Labs Lab 07/04/15 1856 07/05/15 0135 07/05/15 0506 07/06/15 0515  WBC 14.9* 15.7* 14.4* 11.2*  NEUTROABS 13.5*  --   --   --   HGB 11.2* 10.0* 9.9* 9.7*  HCT 34.0* 29.9* 29.3* 27.6*  MCV 89.2 88.5 86.9 85.4  PLT 371 294 274 243   Basic Metabolic Panel:  Recent Labs Lab 07/04/15 1856 07/05/15 0135 07/05/15 0506 07/06/15 0515 07/07/15 0455  NA 140 137 140 144 138  K 4.4 4.0 3.9 3.3* 3.5  CL 103 107 110 116* 113*  CO2 9* 12* 13* 15* 14*  GLUCOSE 199* 139* 108* 161* 197*  BUN 89* 84* 83* 88* 84*  CREATININE 5.88* 5.33* 5.37* 5.63* 5.51*  CALCIUM 8.0* 7.0* 7.0* 6.8* 6.5*  PHOS  --   --   --  5.2* 4.2   GFR: CrCl cannot be calculated (Unknown ideal weight.). Liver Function Tests:  Recent Labs Lab 07/04/15 1856 07/06/15 0515 07/07/15 0455  AST 35  --   --   ALT <5*  --   --   ALKPHOS 63  --   --   BILITOT 0.5  --   --   PROT 6.5  --   --   ALBUMIN 2.5* 2.1* 1.9*     Recent Labs Lab 07/04/15 1856  TROPONINI 0.03   CBG:  Recent Labs Lab 07/05/15 2103 07/06/15 0744 07/06/15 1147 07/06/15 2215 07/07/15 0040  GLUCAP 110* 145* 106* 47* 144*   Urine analysis:    Component Value Date/Time   COLORURINE YELLOW 07/04/2015 1945   APPEARANCEUR CLOUDY* 07/04/2015 1945   LABSPEC 1.025 07/04/2015 1945   PHURINE 5.5 07/04/2015 1945   GLUCOSEU 500* 07/04/2015 1945   HGBUR MODERATE* 07/04/2015 1945   BILIRUBINUR NEGATIVE 07/04/2015 1945   KETONESUR TRACE* 07/04/2015 1945   PROTEINUR 100* 07/04/2015 1945   UROBILINOGEN 0.2 01/11/2014 2247   NITRITE NEGATIVE 07/04/2015 1945   LEUKOCYTESUR MODERATE* 07/04/2015 1945   Sepsis Labs: (procalcitonin:4,lacticidven:4)  ) Recent Results (from the past 240 hour(s))  Blood Culture (routine x 2)     Status: None (Preliminary result)    Collection Time: 07/04/15  6:56 PM  Result Value Ref Range Status   Specimen Description LEFT ANTECUBITAL  Final   Special Requests BOTTLES DRAWN AEROBIC ONLY 4CC ONLY  Final   Culture PENDING  Incomplete   Report Status PENDING  Incomplete  Blood Culture (routine x 2)     Status: None (Preliminary result)   Collection Time: 07/04/15  8:28 PM  Result Value Ref Range Status   Specimen Description BLOOD PICC LINE DRAW  Final   Special Requests BOTTLES DRAWN AEROBIC AND ANAEROBIC [facility] EACH  Final   Culture  Setup Time   Final    GRAM POSITIVE COCCI Gram Stain Report Called to,Read Back By and Verified With: BULILNS,L AT 1910 ON 07/05/2015 BY AGUNDIZ,E. GRAM POSITIVE COCCI Gram Stain Report Called to,Read  Back By and Verified With: MCGIBBONEY,C. AT 1930 ON 07/05/2015 BY AGUNDIZ,E.    Culture   Final    GRAM POSITIVE COCCI CULTURE REINCUBATED FOR BETTER GROWTH Performed at [facility]    Report Status PENDING  Incomplete  MRSA PCR Screening     Status: None   Collection Time: 07/05/15 12:40 AM  Result Value Ref Range Status   MRSA by PCR NEGATIVE NEGATIVE Final    Comment:        The GeneXpert MRSA Assay (FDA approved for NASAL specimens only), is one component of a comprehensive MRSA colonization surveillance program. It is not intended to diagnose MRSA infection nor to guide or monitor treatment for MRSA infections.          Radiology Studies: US Renal  07/06/2015  CLINICAL DATA:  Acute kidney injury, UTI EXAM: RENAL / URINARY TRACT ULTRASOUND COMPLETE COMPARISON:  CT abdomen pelvis dated 12/18/2009 FINDINGS: Right Kidney: Length: 11.1 cm. Echogenic renal parenchyma, suggesting medical renal disease. No hydronephrosis. Left Kidney: Length: 10.9 cm. Echogenic renal parenchyma, suggesting medical renal disease. No hydronephrosis. Bladder: Decompressed by indwelling Foley catheter. IMPRESSION: Echogenic renal parenchyma, suggesting medical renal disease. No  hydronephrosis. Bladder decompressed by indwelling Foley catheter. Electronically Signed   By: Charline Bills M.D.   On: 07/06/2015 11:30        Scheduled Meds: . aspirin EC  81 mg Oral Daily  . feeding supplement (NEPRO CARB STEADY)  237 mL Oral TID WC  . feeding supplement (PRO-STAT SUGAR FREE 64)  30 mL Oral BID  . heparin  5,000 Units Subcutaneous Q8H  . HYDROcodone-acetaminophen  1 tablet Oral Q12H  . insulin aspart  0-9 Units Subcutaneous TID WC  . levofloxacin (LEVAQUIN) IV  500 mg Intravenous Q48H  . metoprolol tartrate  25 mg Oral BID  . sodium bicarbonate  650 mg Oral BID  . sodium chloride flush  3 mL Intravenous Q12H  . vancomycin  1,000 mg Intravenous Q48H   Continuous Infusions: . sodium chloride 125 mL/hr at 07/06/15 0229  . sodium chloride 0.45 % with kcl 135 mL/hr at 07/07/15 0703     LOS: 3 days    Time spent: 25 minutes   Erick Blinks, MD [facility] Pager [phone] If 7PM-7AM, please contact night-coverage [email or web address] Password [facility] 07/07/2015, 8:28 AM   . By signing my name below, I, Zadie Cleverly, attest that this documentation has been prepared under the direction and in the presence of Erick Blinks, MD. Electronically signed: Zadie Cleverly, Scribe. 07/07/2015 12:25pm  I, Dr. Erick Blinks, personally performed the services described in this documentaiton. All medical record entries made by the scribe were at my direction and in my presence. I have reviewed the chart and agree that the record reflects my personal performance and is accurate and complete  Erick Blinks, MD, 07/07/2015 12:42 PM

## 2015-07-08 LAB — RENAL FUNCTION PANEL
ALBUMIN: 2 g/dL — AB (ref 3.5–5.0)
Anion gap: 12 (ref 5–15)
BUN: 85 mg/dL — AB (ref 6–20)
CO2: 12 mmol/L — ABNORMAL LOW (ref 22–32)
CREATININE: 5.71 mg/dL — AB (ref 0.44–1.00)
Calcium: 6.5 mg/dL — ABNORMAL LOW (ref 8.9–10.3)
Chloride: 111 mmol/L (ref 101–111)
GFR, EST AFRICAN AMERICAN: 7 mL/min — AB (ref >60)
GFR, EST NON AFRICAN AMERICAN: 6 mL/min — AB (ref >60)
GLUCOSE: 140 mg/dL — AB (ref 65–99)
Phosphorus: 4.5 mg/dL (ref 2.5–4.6)
Potassium: 4.3 mmol/L (ref 3.5–5.1)
SODIUM: 135 mmol/L (ref 135–145)

## 2015-07-08 LAB — CULTURE, BLOOD (ROUTINE X 2)

## 2015-07-08 LAB — GLUCOSE, CAPILLARY
GLUCOSE-CAPILLARY: 83 mg/dL (ref 65–99)
GLUCOSE-CAPILLARY: 97 mg/dL (ref 65–99)
Glucose-Capillary: 102 mg/dL — ABNORMAL HIGH (ref 65–99)

## 2015-07-08 LAB — PROCALCITONIN

## 2015-07-08 LAB — VANCOMYCIN, RANDOM: Vancomycin Rm: 25 ug/mL

## 2015-07-08 MED ORDER — FUROSEMIDE 10 MG/ML IJ SOLN
80.0000 mg | Freq: Two times a day (BID) | INTRAMUSCULAR | Status: DC
  Administered 2015-07-08 – 2015-07-10 (×6): 80 mg via INTRAVENOUS
  Filled 2015-07-08 (×6): qty 8

## 2015-07-08 MED ORDER — VANCOMYCIN HCL IN DEXTROSE 750-5 MG/150ML-% IV SOLN
750.0000 mg | INTRAVENOUS | Status: DC
  Filled 2015-07-08: qty 150

## 2015-07-08 MED ORDER — STERILE WATER FOR INJECTION IV SOLN
INTRAVENOUS | Status: DC
  Administered 2015-07-08 – 2015-07-09 (×2): via INTRAVENOUS
  Filled 2015-07-08 (×7): qty 9.7

## 2015-07-08 MED ORDER — HYDRALAZINE HCL 20 MG/ML IJ SOLN: 10.0000 mg | Freq: Four times a day (QID) | INTRAMUSCULAR | Status: DC | PRN

## 2015-07-08 NOTE — Progress Notes (Signed)
PROGRESS NOTE    Sara GuarneriHelen W [name]  ZOX:[id number] DOB: 07/15/1933 DOA: 07/04/2015 PCP: Sara MelenaZack Hall, MD   Outpatient Specialists:   West BaliSandi L Fields, MD, Gastroenterology.  Brief Narrative:  [age] yof with a hx of dementia, HTN, CVA, chronic indwelling foley, PVD s/p bilateral BKA was recently started on gentamycin for a UTI with E. Coli / decubitus ulcer. It was noted that her renal function was abnormal and her Cr was elevated to 5. Her abx were stopped and she was started on IV hydration. Repeat labs showed worsening renal function, so she was brought to the ED. She does not eat or drink well and was very dehydrated. She is being treated with IV fluids, but has not had significant recovery of renal function as of yet. Nephrology is following. She has been started on lasix today. Anticipate discharge back to SNF in 2-3 days if renal function recovers..    Assessment & Plan:   Principal Problem:   Acute renal failure (HCC) Active Problems:   Essential hypertension   PERIPHERAL VASCULAR DISEASE   Dehydration   h/o Stroke   CAD (coronary artery disease)   DM (diabetes mellitus), type 2, uncontrolled, periph vascular complic (HCC)   UTI (lower urinary tract infection)   Dementia   Metabolic acidosis   Lactic acidosis   Buttock wound  1. Acute renal failure. Likely due to prerenal azotemia in the setting of dehydration and ACE inhibitor/ gentamycin.  Nephrology following. She is continued on IV hydration, but has also been started on IV lasix. Continue to monitor urine output. Continue to monitor BMP. BUN and creatinine trending up. Renal ultrasound does not show obstructive process. 2. Gram positive cocci bacteremia, 1/2 cultures positive for coagulase negative staph and enterococcus. This culture was drawn from PICC line. Discussed with Dr. Anne HahnSynder with ID who recommended removing the PICC line, discontinuing antibiotics and then repeating blood cultures in 48 hours (4/27). I have placed an order  to have PICC removed once reliable peripheral IV can be secured.  3. Hypokalemia, resolved.  4. Metabolic acidosis. Due to renal failure. Continue IVFs. Started on bicarbonate infusion today 5. Lactic acidosis. Anticipate resolution with tx renal failure. Continue IVFs. Hold metformin. 6. Dehydration. Anticipate resolution with IVFs. 7. Essential HTN. On metoprolol and norvasc. Continue hydralazine prn. Continue to hold ace-inhibitor.  8. DM type 2. With peripheral vascular complications. Blood sugars are in reasonable range. Continue SSI.  9. Stage III Decubitus ulcer on buttock, present on admisison. Does not appear to be actively infected. Continue wound care. 10. Dementia. Stable.   DVT prophylaxis: Heparin Code Status: Full Family Communication: Family present at bedside Disposition Plan: Discharge back to SNF in 2-3 days  Consultants:   Nephrology  WOC  Procedures:   none  Antimicrobials:   Levaquin 4/21 >>4/25  Vanc 4/21 >>  4/25  Azactam 4/21 >> 4/22  Subjective: Patient does not answer questions. Family reports that she has been sleepy today  Objective: Filed Vitals:   07/07/15 0922 07/07/15 1321 07/07/15 1342 07/08/15 0616  BP: 171/68 146/72 138/49 133/58  Pulse: 63 63 53 62  Temp:  98.2 F (36.8 C) 98.1 F (36.7 C) 98.2 F (36.8 C)  TempSrc:  Oral  Oral  Resp:   18 18  Weight:    48.9 kg (107 lb 12.9 oz)  SpO2:  100% 99% 98%    Intake/Output Summary (Last 24 hours) at 07/08/15 0648 Last data filed at 07/08/15 0617  Gross per 24 hour  Intake 3099.67 ml  Output   1225 ml  Net 1874.67 ml   Filed Weights   07/06/15 0649 07/07/15 0654 07/08/15 0616  Weight: 50.7 kg (111 lb 12.4 oz) 49.7 kg (109 lb 9.1 oz) 48.9 kg (107 lb 12.9 oz)   Examination:  General exam: NAD. Awakes to voice but does not participate in the exam Respiratory system: CTA B. Respiratory effort normal. Cardiovascular system: RRR. No JVD, murmurs, rubs, gallops or clicks. No  pedal edema. Gastrointestinal system: Abdomen is nondistended, soft and nontender. No organomegaly or masses felt. Normal bowel sounds heard. Central nervous system: No gross focal deficits. She is somnolent Skin:  Stage 3 pressure ulcer on the right ischial tuberosity that does not appear to be infected Extremities: bilateral BKA Psychiatry: unable to assess due to dementia    Data Reviewed: I have personally reviewed following labs and imaging studies  CBC:  Recent Labs Lab 07/04/15 1856 07/05/15 0135 07/05/15 0506 07/06/15 0515  WBC 14.9* 15.7* 14.4* 11.2*  NEUTROABS 13.5*  --   --   --   HGB 11.2* 10.0* 9.9* 9.7*  HCT 34.0* 29.9* 29.3* 27.6*  MCV 89.2 88.5 86.9 85.4  PLT 371 294 274 243   Basic Metabolic Panel:  Recent Labs Lab 07/05/15 0135 07/05/15 0506 07/06/15 0515 07/07/15 0455 07/08/15 0437  NA 137 140 144 138 135  K 4.0 3.9 3.3* 3.5 4.3  CL 107 110 116* 113* 111  CO2 12* 13* 15* 14* 12*  GLUCOSE 139* 108* 161* 197* 140*  BUN 84* 83* 88* 84* 85*  CREATININE 5.33* 5.37* 5.63* 5.51* 5.71*  CALCIUM 7.0* 7.0* 6.8* 6.5* 6.5*  PHOS  --   --  5.2* 4.2 4.5   GFR: CrCl cannot be calculated (Unknown ideal weight.). Liver Function Tests:  Recent Labs Lab 07/04/15 1856 07/06/15 0515 07/07/15 0455 07/08/15 0437  AST 35  --   --   --   ALT <5*  --   --   --   ALKPHOS 63  --   --   --   BILITOT 0.5  --   --   --   PROT 6.5  --   --   --   ALBUMIN 2.5* 2.1* 1.9* 2.0*     Recent Labs Lab 07/04/15 1856  TROPONINI 0.03   CBG:  Recent Labs Lab 07/06/15 1147 07/06/15 2215 07/07/15 0040 07/07/15 1632 07/07/15 2137  GLUCAP 106* 47* 144* 101* 133*   Urine analysis:    Component Value Date/Time   COLORURINE YELLOW 07/04/2015 1945   APPEARANCEUR CLOUDY* 07/04/2015 1945   LABSPEC 1.025 07/04/2015 1945   PHURINE 5.5 07/04/2015 1945   GLUCOSEU 500* 07/04/2015 1945   HGBUR MODERATE* 07/04/2015 1945   BILIRUBINUR NEGATIVE 07/04/2015 1945   KETONESUR  TRACE* 07/04/2015 1945   PROTEINUR 100* 07/04/2015 1945   UROBILINOGEN 0.2 01/11/2014 2247   NITRITE NEGATIVE 07/04/2015 1945   LEUKOCYTESUR MODERATE* 07/04/2015 1945   Sepsis Labs: (procalcitonin:4,lacticidven:4)  ) Recent Results (from the past 240 hour(s))  Blood Culture (routine x 2)     Status: None (Preliminary result)   Collection Time: 07/04/15  6:56 PM  Result Value Ref Range Status   Specimen Description LEFT ANTECUBITAL  Final   Special Requests BOTTLES DRAWN AEROBIC ONLY 4CC ONLY  Final   Culture NO GROWTH 3 DAYS  Final   Report Status PENDING  Incomplete  Urine culture     Status: None   Collection Time: 07/04/15  7:45 PM  Result Value Ref Range Status   Specimen Description URINE, RANDOM  Final   Special Requests NONE  Final   Culture MULTIPLE SPECIES PRESENT, SUGGEST RECOLLECTION  Final   Report Status 07/07/2015 FINAL  Final  Blood Culture (routine x 2)     Status: Abnormal (Preliminary result)   Collection Time: 07/04/15  8:28 PM  Result Value Ref Range Status   Specimen Description BLOOD PICC LINE DRAW  Final   Special Requests BOTTLES DRAWN AEROBIC AND ANAEROBIC 6CC EACH  Final   Culture  Setup Time   Final    GRAM POSITIVE COCCI Gram Stain Report Called to,Read Back By and Verified With: BULILNS,L AT 1910 ON 07/05/2015 BY AGUNDIZ,E. GRAM POSITIVE COCCI Gram Stain Report Called to,Read Back By and Verified With: MCGIBBONEY,C. AT 1930 ON 07/05/2015 BY AGUNDIZ,E.    Culture (A)  Final    ENTEROCOCCUS SPECIES STAPHYLOCOCCUS SPECIES (COAGULASE NEGATIVE)    Report Status PENDING  Incomplete  MRSA PCR Screening     Status: None   Collection Time: 07/05/15 12:40 AM  Result Value Ref Range Status   MRSA by PCR NEGATIVE NEGATIVE Final    Comment:        The GeneXpert MRSA Assay (FDA approved for NASAL specimens only), is one component of a comprehensive MRSA colonization surveillance program. It is not intended to diagnose MRSA infection nor to  guide or monitor treatment for MRSA infections.          Radiology Studies: US Renal  07/06/2015  CLINICAL DATA:  Acute kidney injury, UTI EXAM: RENAL / URINARY TRACT ULTRASOUND COMPLETE COMPARISON:  CT abdomen pelvis dated 12/18/2009 FINDINGS: Right Kidney: Length: 11.1 cm. Echogenic renal parenchyma, suggesting medical renal disease. No hydronephrosis. Left Kidney: Length: 10.9 cm. Echogenic renal parenchyma, suggesting medical renal disease. No hydronephrosis. Bladder: Decompressed by indwelling Foley catheter. IMPRESSION: Echogenic renal parenchyma, suggesting medical renal disease. No hydronephrosis. Bladder decompressed by indwelling Foley catheter. Electronically Signed   By: Charline Bills M.D.   On: 07/06/2015 11:30        Scheduled Meds: . amLODipine  5 mg Oral Daily  . aspirin EC  81 mg Oral Daily  . feeding supplement (NEPRO CARB STEADY)  237 mL Oral TID WC  . feeding supplement (PRO-STAT SUGAR FREE 64)  30 mL Oral BID  . heparin  5,000 Units Subcutaneous Q8H  . HYDROcodone-acetaminophen  1 tablet Oral Q12H  . insulin aspart  0-9 Units Subcutaneous TID WC  . levofloxacin (LEVAQUIN) IV  500 mg Intravenous Q48H  . metoprolol tartrate  25 mg Oral BID  . sodium bicarbonate  650 mg Oral BID  . sodium chloride flush  3 mL Intravenous Q12H  . vancomycin  1,000 mg Intravenous Q48H   Continuous Infusions: . sodium chloride 125 mL/hr at 07/06/15 0229  . sodium chloride 0.45 % with kcl 135 mL/hr at 07/07/15 1639     LOS: 4 days    Time spent: 25 minutes   Erick Blinks, MD [facility] Pager [phone] If 7PM-7AM, please contact night-coverage [email or web address] Password [facility] 07/08/2015, 6:48 AM

## 2015-07-08 NOTE — Progress Notes (Signed)
ANTIBIOTIC CONSULT NOTE-  Pharmacy Consult for Vancomycin and Levaquin Indication: sepsis / wound infection  Allergies  Allergen Reactions  . Penicillins     REACTION: unknown reaction: MAR reported   Patient Measurements: Weight: 107 lb 12.9 oz (48.9 kg)  Vital Signs: Temp: 98.2 F (36.8 C) (04/25 0616) Temp Source: Oral (04/25 0616) BP: 133/58 mmHg (04/25 0616) Pulse Rate: 62 (04/25 0616)  Labs:  Recent Labs  07/05/15 1400 07/06/15 0515 07/07/15 0455 07/08/15 0437  WBC  --  11.2*  --   --   HGB  --  9.7*  --   --   PLT  --  243  --   --   LABCREA 24.81  --   --   --   CREATININE  --  5.63* 5.51* 5.71*   CrCl cannot be calculated (Unknown ideal weight.).   Recent Labs  07/08/15 0437  VANCORANDOM 25  (was on Gentamicin PTA per notes, Natasha Bence has been d/c'd)   Microbiology: Recent Results (from the past 720 hour(s))  Blood Culture (routine x 2)     Status: None (Preliminary result)   Collection Time: 07/04/15  6:56 PM  Result Value Ref Range Status   Specimen Description LEFT ANTECUBITAL  Final   Special Requests BOTTLES DRAWN AEROBIC ONLY 4CC ONLY  Final   Culture NO GROWTH 4 DAYS  Final   Report Status PENDING  Incomplete  Urine culture     Status: None   Collection Time: 07/04/15  7:45 PM  Result Value Ref Range Status   Specimen Description URINE, RANDOM  Final   Special Requests NONE  Final   Culture MULTIPLE SPECIES PRESENT, SUGGEST RECOLLECTION  Final   Report Status 07/07/2015 FINAL  Final  Blood Culture (routine x 2)     Status: Abnormal   Collection Time: 07/04/15  8:28 PM  Result Value Ref Range Status   Specimen Description BLOOD PICC LINE DRAWN BY RN  Final   Special Requests BOTTLES DRAWN AEROBIC AND ANAEROBIC [facility] EACH  Final   Culture  Setup Time   Final    GRAM POSITIVE COCCI Gram Stain Report Called to,Read Back By and Verified With: BULILNS,L AT 1910 ON 07/05/2015 BY AGUNDIZ,E. GRAM POSITIVE COCCI Gram Stain Report Called to,Read Back By  and Verified With: MCGIBBONEY,C. AT 1930 ON 07/05/2015 BY AGUNDIZ,E.    Culture (A)  Final    ENTEROCOCCUS SPECIES STAPHYLOCOCCUS SPECIES (COAGULASE NEGATIVE) THE SIGNIFICANCE OF ISOLATING THIS ORGANISM FROM A SINGLE SET OF BLOOD CULTURES WHEN MULTIPLE SETS ARE DRAWN IS UNCERTAIN. PLEASE NOTIFY THE MICROBIOLOGY DEPARTMENT WITHIN ONE WEEK IF SPECIATION AND SENSITIVITIES ARE REQUIRED. Performed at [facility]    Report Status 07/08/2015 FINAL  Final   Organism ID, Bacteria ENTEROCOCCUS SPECIES  Final      Susceptibility   Enterococcus species - MIC*    AMPICILLIN <=2 SENSITIVE Sensitive     VANCOMYCIN 1 SENSITIVE Sensitive     GENTAMICIN SYNERGY RESISTANT Resistant     * ENTEROCOCCUS SPECIES  MRSA PCR Screening     Status: None   Collection Time: 07/05/15 12:40 AM  Result Value Ref Range Status   MRSA by PCR NEGATIVE NEGATIVE Final    Comment:        The GeneXpert MRSA Assay (FDA approved for NASAL specimens only), is one component of a comprehensive MRSA colonization surveillance program. It is not intended to diagnose MRSA infection nor to guide or monitor treatment for MRSA infections.    Medical  History: Past Medical History  Diagnosis Date  . HTN (hypertension)   . Stroke (HCC)   . Diabetes mellitus without complication (HCC)   . Embolism (HCC)   . PVD (peripheral vascular disease) (HCC)   . Dementia    Assessment: 82yo female admitted with ARF and UTI / sepsis.  CONS and enterococcus in blood.  Vanc level this am 25 mg/L    Plan:   Change vanc to 750mg  IV q48hrs starting tomorrow  Check level when appropriate  Consider dc Levaquin 500mg  IV q48hrs  Monitor labs, renal fxn, progress and c/s  Laquasia Pincus Poteet, RPH 07/08/2015,10:21 AM

## 2015-07-08 NOTE — Progress Notes (Signed)
Subjective: Interval History: Patient offers no complaints.  Objective: Vital signs in last 24 hours: Temp:  [98.1 F (36.7 C)-98.2 F (36.8 C)] 98.2 F (36.8 C) (04/25 0616) Pulse Rate:  [53-63] 62 (04/25 0616) Resp:  [18] 18 (04/25 0616) BP: (133-171)/(49-72) 133/58 mmHg (04/25 0616) SpO2:  [98 %-100 %] 98 % (04/25 0616) Weight:  [48.9 kg (107 lb 12.9 oz)] 48.9 kg (107 lb 12.9 oz) (04/25 0616) Weight change: -0.8 kg (-1 lb 12.2 oz)  Intake/Output from previous day: 04/24 0701 - 04/25 0700 In: 3099.7 [P.O.:20; I.V.:3079.7] Out: 675 [Urine:675] Intake/Output this shift:   Generally patient is alert and in no apparent distress Chest is clear to auscultation Heart examination regular rate and rhythm no murmur or S3 Extremities: She is status post bilateral BKA and no edema   Lab Results:  Recent Labs  07/06/15 0515  WBC 11.2*  HGB 9.7*  HCT 27.6*  PLT 243   BMET:   Recent Labs  07/07/15 0455 07/08/15 0437  NA 138 135  K 3.5 4.3  CL 113* 111  CO2 14* 12*  GLUCOSE 197* 140*  BUN 84* 85*  CREATININE 5.51* 5.71*  CALCIUM 6.5* 6.5*   No results for input(s): PTH in the last 72 hours. Iron Studies: No results for input(s): IRON, TIBC, TRANSFERRIN, FERRITIN in the last 72 hours.  Studies/Results: [facility]  07/06/2015  CLINICAL DATA:  Acute kidney injury, UTI EXAM: RENAL / URINARY TRACT ULTRASOUND COMPLETE COMPARISON:  CT abdomen pelvis dated 12/18/2009 FINDINGS: Right Kidney: Length: 11.1 cm. Echogenic renal parenchyma, suggesting medical renal disease. No hydronephrosis. Left Kidney: Length: 10.9 cm. Echogenic renal parenchyma, suggesting medical renal disease. No hydronephrosis. Bladder: Decompressed by indwelling Foley catheter. IMPRESSION: Echogenic renal parenchyma, suggesting medical renal disease. No hydronephrosis. Bladder decompressed by indwelling Foley catheter. Electronically Signed   By: Charline BillsSriyesh  Krishnan M.D.   On: 07/06/2015 11:30    I have reviewed  the patient's current medications.  Assessment/Plan: Problem #1 acute kidney injury superimposed on chronic. Thought to be secondary to gentamicin/ACE. Presently on vancomycin. Her BUN and creatinine still remained high. Patient presently patient is non-oliguric.  Problem #2 hypokalemia: Patient is on potassium supplement and her potassium has corrected.  Problem #3. Low CO2: Possibly metabolic. Patient is started on sodium bicarbonate orally however her CO2 continued to decline.  Problem #4 anemia: Her hemoglobin is below our target goal. Etiology as this moment is no clear. Problem #5 history of diabetes Problem #6 urinary tract infection patient on antibiotics: Presently she is afebrile and her white blood cell count is improving. Patient on antibiotics Problem #7 history of CVA Problem #8. Metabolic bone disease her calcium is low and phosphorus is slightly high. Plan: 1] because of lack of renal function improvement has this moment patient may benefit from discontinuation of vancomycin. 2] will DC IV fluid with potassium 3] will DC by mouth sodium bicarbonate 4] will start patient on 1/4 saline with 100 mEq of sodium bicarbonate at 125 mL per hour 5] will start patient on Lasix 80 mg IV twice a day  6] check her renal panel in the morning.   LOS: 4 days   Sayed Apostol S 07/08/2015,7:59 AM

## 2015-07-09 DIAGNOSIS — L8990 Pressure ulcer of unspecified site, unspecified stage: Secondary | ICD-10-CM | POA: Insufficient documentation

## 2015-07-09 DIAGNOSIS — I739 Peripheral vascular disease, unspecified: Secondary | ICD-10-CM

## 2015-07-09 LAB — BASIC METABOLIC PANEL
Anion gap: 16 — ABNORMAL HIGH (ref 5–15)
BUN: 82 mg/dL — AB (ref 6–20)
CHLORIDE: 107 mmol/L (ref 101–111)
CO2: 14 mmol/L — ABNORMAL LOW (ref 22–32)
CREATININE: 5.9 mg/dL — AB (ref 0.44–1.00)
Calcium: 6.3 mg/dL — CL (ref 8.9–10.3)
GFR, EST AFRICAN AMERICAN: 7 mL/min — AB (ref >60)
GFR, EST NON AFRICAN AMERICAN: 6 mL/min — AB (ref >60)
Glucose, Bld: 89 mg/dL (ref 65–99)
Potassium: 3.4 mmol/L — ABNORMAL LOW (ref 3.5–5.1)
SODIUM: 137 mmol/L (ref 135–145)

## 2015-07-09 LAB — CBC
HCT: 28.3 % — ABNORMAL LOW (ref 36.0–46.0)
Hemoglobin: 10 g/dL — ABNORMAL LOW (ref 12.0–15.0)
MCH: 29.2 pg (ref 26.0–34.0)
MCHC: 35.3 g/dL (ref 30.0–36.0)
MCV: 82.5 fL (ref 78.0–100.0)
PLATELETS: 258 10*3/uL (ref 150–400)
RBC: 3.43 MIL/uL — AB (ref 3.87–5.11)
RDW: 13.3 % (ref 11.5–15.5)
WBC: 11.7 10*3/uL — AB (ref 4.0–10.5)

## 2015-07-09 LAB — CULTURE, BLOOD (ROUTINE X 2): CULTURE: NO GROWTH

## 2015-07-09 LAB — GLUCOSE, CAPILLARY
GLUCOSE-CAPILLARY: 87 mg/dL (ref 65–99)
GLUCOSE-CAPILLARY: 100 mg/dL — AB (ref 65–99)
Glucose-Capillary: 101 mg/dL — ABNORMAL HIGH (ref 65–99)

## 2015-07-09 MED ORDER — POTASSIUM CHLORIDE CRYS ER 20 MEQ PO TBCR
40.0000 meq | EXTENDED_RELEASE_TABLET | Freq: Once | ORAL | Status: AC
  Administered 2015-07-09: 40 meq via ORAL
  Filled 2015-07-09 (×2): qty 2

## 2015-07-09 MED ORDER — SODIUM CHLORIDE 0.9 % IV SOLN
1.0000 g | Freq: Once | INTRAVENOUS | Status: AC
  Administered 2015-07-09: 1 g via INTRAVENOUS
  Filled 2015-07-09 (×2): qty 10

## 2015-07-09 NOTE — Progress Notes (Signed)
[name]  MRN: [id number]  DOB/AGE: 1934/02/17 82 y.o.  Primary Care Physician:Zack Margo Aye, MD  Admit date: 07/04/2015  Chief Complaint:  Chief Complaint  Patient presents with  . Abnormal Labs     S-Pt presented on  07/04/2015 with  Chief Complaint  Patient presents with  . Abnormal Labs   .    Pt today feels better  Meds  . amLODipine  5 mg Oral Daily  . aspirin EC  81 mg Oral Daily  . calcium gluconate  1 g Intravenous Once  . feeding supplement (NEPRO CARB STEADY)  237 mL Oral TID WC  . feeding supplement (PRO-STAT SUGAR FREE 64)  30 mL Oral BID  . furosemide  80 mg Intravenous BID  . heparin  5,000 Units Subcutaneous Q8H  . HYDROcodone-acetaminophen  1 tablet Oral Q12H  . insulin aspart  0-9 Units Subcutaneous TID WC  . metoprolol tartrate  25 mg Oral BID  . sodium chloride flush  3 mL Intravenous Q12H      Physical Exam: Vital signs in last 24 hours: Temp:  [98.1 F (36.7 C)-98.4 F (36.9 C)] 98.4 F (36.9 C) (04/26 0700) Pulse Rate:  [57-75] 75 (04/26 0902) Resp:  [16-20] 16 (04/26 0700) BP: (135-161)/(52-60) 152/52 mmHg (04/26 0902) SpO2:  [97 %-100 %] 100 % (04/26 0700) Weight:  [131 lb 6.3 oz (59.6 kg)] 131 lb 6.3 oz (59.6 kg) (04/26 0600) Weight change: 23 lb 9.4 oz (10.7 kg) Last BM Date: 07/08/15  Intake/Output from previous day: 04/25 0701 - 04/26 0700 In: 60 [P.O.:60] Out: 1300 [Urine:1300] Total I/O In: 60 [P.O.:60] Out: -    Physical Exam: General- pt is awake,alert Resp- No acute REsp distress, decreased at bases. CVS- S1S2 regular in rate and rhythm, SEM + GIT- BS+, soft, NT, ND EXT- b/L BKA, trace Edema,No Cyanosis   Lab Results: CBC  Recent Labs  07/09/15 0434  WBC 11.7*  HGB 10.0*  HCT 28.3*  PLT 258    BMET  Recent Labs  07/08/15 0437 07/09/15 0434  NA 135 137  K 4.3 3.4*  CL 111 107  CO2 12* 14*  GLUCOSE 140* 89  BUN 85* 82*  CREATININE 5.71* 5.90*  CALCIUM 6.5* 6.3*   Creat trend 2017   5.7--5.9    MICRO Recent Results (from the past 240 hour(s))  Blood Culture (routine x 2)     Status: None (Preliminary result)   Collection Time: 07/04/15  6:56 PM  Result Value Ref Range Status   Specimen Description LEFT ANTECUBITAL  Final   Special Requests BOTTLES DRAWN AEROBIC ONLY 4CC ONLY  Final   Culture NO GROWTH 4 DAYS  Final   Report Status PENDING  Incomplete  Urine culture     Status: None   Collection Time: 07/04/15  7:45 PM  Result Value Ref Range Status   Specimen Description URINE, RANDOM  Final   Special Requests NONE  Final   Culture MULTIPLE SPECIES PRESENT, SUGGEST RECOLLECTION  Final   Report Status 07/07/2015 FINAL  Final  Blood Culture (routine x 2)     Status: Abnormal   Collection Time: 07/04/15  8:28 PM  Result Value Ref Range Status   Specimen Description BLOOD PICC LINE DRAWN BY RN  Final   Special Requests BOTTLES DRAWN AEROBIC AND ANAEROBIC [facility] EACH  Final   Culture  Setup Time   Final    GRAM POSITIVE COCCI Gram Stain Report Called to,Read Back By and Verified With: BULILNS,L  AT 1910 ON 07/05/2015 BY AGUNDIZ,E. GRAM POSITIVE COCCI Gram Stain Report Called to,Read Back By and Verified With: MCGIBBONEY,C. AT 1930 ON 07/05/2015 BY AGUNDIZ,E.    Culture (A)  Final    ENTEROCOCCUS SPECIES STAPHYLOCOCCUS SPECIES (COAGULASE NEGATIVE) THE SIGNIFICANCE OF ISOLATING THIS ORGANISM FROM A SINGLE SET OF BLOOD CULTURES WHEN MULTIPLE SETS ARE DRAWN IS UNCERTAIN. PLEASE NOTIFY THE MICROBIOLOGY DEPARTMENT WITHIN ONE WEEK IF SPECIATION AND SENSITIVITIES ARE REQUIRED. [facility]    Report Status 07/08/2015 FINAL  Final   Organism ID, Bacteria ENTEROCOCCUS SPECIES  Final      Susceptibility   Enterococcus species - MIC*    AMPICILLIN <=2 SENSITIVE Sensitive     VANCOMYCIN 1 SENSITIVE Sensitive     GENTAMICIN SYNERGY RESISTANT Resistant     * ENTEROCOCCUS SPECIES  MRSA PCR Screening     Status: None   Collection Time: 07/05/15 12:40 AM   Result Value Ref Range Status   MRSA by PCR NEGATIVE NEGATIVE Final    Comment:        The GeneXpert MRSA Assay (FDA approved for NASAL specimens only), is one component of a comprehensive MRSA colonization surveillance program. It is not intended to diagnose MRSA infection nor to guide or monitor treatment for MRSA infections.       Lab Results  Component Value Date   CALCIUM 6.3* 07/09/2015   PHOS 4.5 07/08/2015   Alb 2.0 Corrected calcium 6.5+1.6=8.1     Impression: 1)Renal  AKI secondary to ATN               AKI sec to Gentamycin /ACE               Non oliguric ATN  2)HTN  Medication- On Diuretics- On Calcium Channel Blockers On Beta blockers On Vasodilators   3)Anemia HGb stable  4)Hypocalcemia Phosphorus at goal. After correcting for low albumin calcium is still low but stable  5)ID-Blood cultures positive for Coagulase negative staph and enterococcus PICC line now d/ced  6)Electrolytes Hypokalemic   Received KCL this am   NOrmonatremic   7)Acid base Co2 not at goal Started on IV Bicarb    Plan:  Will continue current diuretics I discuss pt renal issues with the daughter.     Shayann Garbutt S 07/09/2015, 9:16 AM

## 2015-07-09 NOTE — Progress Notes (Signed)
CRITICAL VALUE ALERT  Critical value received: calcium 6.3  Date of notification:  4-26  Time of notification: 0615  Critical value read back: yes  Nurse who received alert:  Bjohnson, rn   MD notified (1st page):  danford  Time of first page: 0625  MD notified (2nd page):  Time of second page:  Responding MD: 0630  Time MD responded:  0630

## 2015-07-09 NOTE — Progress Notes (Signed)
PROGRESS NOTE    [name]  ZOX:[id number] DOB: 1934/01/16 DOA: 07/04/2015 PCP: [name], MD  Outpatient Specialists:    Brief Narrative: 65 yof with a hx of dementia, HTN, CVA, chronic indwelling foley, PVD s/p bilateral BKA was recently started on gentamycin for a UTI with E. Coli / decubitus ulcer. It was noted that her renal function was abnormal and her Cr was elevated to 5. Her abx were stopped and she was started on IV hydration. Repeat labs showed worsening renal function, so she was brought to the ED. She does not eat or drink well and was very dehydrated. She is being treated with IV fluids, but has not had significant recovery of renal function as of yet. Nephrology is following.   Assessment & Plan:   Principal Problem:   Acute renal failure (HCC) Active Problems:   Essential hypertension   PERIPHERAL VASCULAR DISEASE   Dehydration   h/o Stroke   CAD (coronary artery disease)   DM (diabetes mellitus), type 2, uncontrolled, periph vascular complic (HCC)   Dementia   Metabolic acidosis   Lactic acidosis   Buttock wound   Pressure ulcer    Acute renal failure.   Likely due to prerenal azotemia in the setting of dehydration and ACE inhibitor/ gentamycin.   Nephrology was consulted and is following.   Patient is continued on IV hydration, but has also been started on IV lasix.   Continue to monitor urine output. Continue to monitor BMP. BUN and creatinine trending up.   Renal ultrasound without obstructive process.  Gram positive cocci bacteremia, 1/2 cultures positive for coagulase negative staph and enterococcus.   This culture was drawn from PICC line.   On 07/08/2015, Dr. Kerry Hough had discussed with Dr. Anne Hahn with ID who recommended removing the PICC line, discontinuing antibiotics and then repeating blood cultures on 4/27   Hypokalemia  resolved.   Metabolic acidosis.   Due to renal failure.   Continue IVFs.   Continue on bicarbonate  infusion  Lactic acidosis.   Anticipate resolution with tx renal failure.   Continue IVFs.   Holding metformin.  Dehydration.  Clinically improved with IV fluids  Essential HTN.   Patient is continued on metoprolol and norvasc.   Continue hydralazine prn.   Continue to hold ace-inhibitor per above  DM type 2 with peripheral vascular complications.   Continue SSI.   Stage III Decubitus ulcer on buttock, present on admisison.   Does not appear to be actively infected.   Continue wound care.  Dementia.   Stable.   DVT prophylaxis: Heparin subcutaneous Code Status: Full Family Communication: Patient in room, daughter at bedside Disposition Plan: Uncertain at this time  Consultants:   Nephrology  Procedures:     Antimicrobials:   Levaquin 4/21 >>4/25  Vanc 4/21 >> 4/25  Azactam 4/21 >> 4/22    Subjective: Unable to obtain today, patient is sleepy  Objective: Filed Vitals:   07/08/15 2106 07/09/15 0600 07/09/15 0700 07/09/15 0902  BP: 146/58  135/60 152/52  Pulse: 60  57 75  Temp: 98.1 F (36.7 C)  98.4 F (36.9 C)   TempSrc: Oral  Oral   Resp: 20  16   Height:      Weight:  59.6 kg (131 lb 6.3 oz)    SpO2: 100%  100%     Intake/Output Summary (Last 24 hours) at 07/09/15 1349 Last data filed at 07/09/15 1100  Gross per 24 hour  Intake  63 ml  Output   2750 ml  Net  -2687 ml   Filed Weights   07/07/15 0654 07/08/15 0616 07/09/15 0600  Weight: 49.7 kg (109 lb 9.1 oz) 48.9 kg (107 lb 12.9 oz) 59.6 kg (131 lb 6.3 oz)    Examination:  General exam: Asleep, appears comfortable  Respiratory system: Clear to auscultation. Respiratory effort normal. Cardiovascular system: S1 & S2 heard, RRR.  Gastrointestinal system: Abdomen is nondistended, soft and nontender.  Normal bowel sounds heard. Central nervous system: Asleep, easily arousable. No focal neurological deficits. Extremities: Symmetric 5 x 5 power. Skin: No rashes, lesions  or ulcers Psychiatry: Difficult to assess, patient drowsy    Data Reviewed: I have personally reviewed following labs and imaging studies  CBC:  Recent Labs Lab 07/04/15 1856 07/05/15 0135 07/05/15 0506 07/06/15 0515 07/09/15 0434  WBC 14.9* 15.7* 14.4* 11.2* 11.7*  NEUTROABS 13.5*  --   --   --   --   HGB 11.2* 10.0* 9.9* 9.7* 10.0*  HCT 34.0* 29.9* 29.3* 27.6* 28.3*  MCV 89.2 88.5 86.9 85.4 82.5  PLT 371 294 274 243 258   Basic Metabolic Panel:  Recent Labs Lab 07/05/15 0506 07/06/15 0515 07/07/15 0455 07/08/15 0437 07/09/15 0434  NA 140 144 138 135 137  K 3.9 3.3* 3.5 4.3 3.4*  CL 110 116* 113* 111 107  CO2 13* 15* 14* 12* 14*  GLUCOSE 108* 161* 197* 140* 89  BUN 83* 88* 84* 85* 82*  CREATININE 5.37* 5.63* 5.51* 5.71* 5.90*  CALCIUM 7.0* 6.8* 6.5* 6.5* 6.3*  PHOS  --  5.2* 4.2 4.5  --    GFR: Estimated Creatinine Clearance: 7 mL/min (by C-G formula based on Cr of 5.9). Liver Function Tests:  Recent Labs Lab 07/04/15 1856 07/06/15 0515 07/07/15 0455 07/08/15 0437  AST 35  --   --   --   ALT <5*  --   --   --   ALKPHOS 63  --   --   --   BILITOT 0.5  --   --   --   PROT 6.5  --   --   --   ALBUMIN 2.5* 2.1* 1.9* 2.0*   No results for input(s): LIPASE, AMYLASE in the last 168 hours. No results for input(s): AMMONIA in the last 168 hours. Coagulation Profile: No results for input(s): INR, PROTIME in the last 168 hours. Cardiac Enzymes:  Recent Labs Lab 07/04/15 1856  TROPONINI 0.03   BNP (last 3 results) No results for input(s): PROBNP in the last 8760 hours. HbA1C: No results for input(s): HGBA1C in the last 72 hours. CBG:  Recent Labs Lab 07/07/15 2137 07/08/15 1149 07/08/15 1724 07/08/15 2105 07/09/15 0736  GLUCAP 133* 83 97 102* 87   Lipid Profile: No results for input(s): CHOL, HDL, LDLCALC, TRIG, CHOLHDL, LDLDIRECT in the last 72 hours. Thyroid Function Tests: No results for input(s): TSH, T4TOTAL, FREET4, T3FREE, THYROIDAB  in the last 72 hours. Anemia Panel: No results for input(s): VITAMINB12, FOLATE, FERRITIN, TIBC, IRON, RETICCTPCT in the last 72 hours. Urine analysis:    Component Value Date/Time   COLORURINE YELLOW 07/04/2015 1945   APPEARANCEUR CLOUDY* 07/04/2015 1945   LABSPEC 1.025 07/04/2015 1945   PHURINE 5.5 07/04/2015 1945   GLUCOSEU 500* 07/04/2015 1945   HGBUR MODERATE* 07/04/2015 1945   BILIRUBINUR NEGATIVE 07/04/2015 1945   KETONESUR TRACE* 07/04/2015 1945   PROTEINUR 100* 07/04/2015 1945   UROBILINOGEN 0.2 01/11/2014 2247   NITRITE NEGATIVE 07/04/2015  1945   LEUKOCYTESUR MODERATE* 07/04/2015 1945   Sepsis Labs: @LABRCNTIP (procalcitonin:4,lacticidven:4)  ) Recent Results (from the past 240 hour(s))  Blood Culture (routine x 2)     Status: None   Collection Time: 07/04/15  6:56 PM  Result Value Ref Range Status   Specimen Description LEFT ANTECUBITAL  Final   Special Requests BOTTLES DRAWN AEROBIC ONLY 4CC ONLY  Final   Culture NO GROWTH 5 DAYS  Final   Report Status 07/09/2015 FINAL  Final  Urine culture     Status: None   Collection Time: 07/04/15  7:45 PM  Result Value Ref Range Status   Specimen Description URINE, RANDOM  Final   Special Requests NONE  Final   Culture MULTIPLE SPECIES PRESENT, SUGGEST RECOLLECTION  Final   Report Status 07/07/2015 FINAL  Final  Blood Culture (routine x 2)     Status: Abnormal   Collection Time: 07/04/15  8:28 PM  Result Value Ref Range Status   Specimen Description BLOOD PICC LINE DRAWN BY RN  Final   Special Requests BOTTLES DRAWN AEROBIC AND ANAEROBIC [facility] EACH  Final   Culture  Setup Time   Final    GRAM POSITIVE COCCI Gram Stain Report Called to,Read Back By and Verified With: BULILNS,L AT 1910 ON 07/05/2015 BY AGUNDIZ,E. GRAM POSITIVE COCCI Gram Stain Report Called to,Read Back By and Verified With: MCGIBBONEY,C. AT 1930 ON 07/05/2015 BY AGUNDIZ,E.    Culture (A)  Final    ENTEROCOCCUS SPECIES STAPHYLOCOCCUS SPECIES (COAGULASE  NEGATIVE) THE SIGNIFICANCE OF ISOLATING THIS ORGANISM FROM A SINGLE SET OF BLOOD CULTURES WHEN MULTIPLE SETS ARE DRAWN IS UNCERTAIN. PLEASE NOTIFY THE MICROBIOLOGY DEPARTMENT WITHIN ONE WEEK IF SPECIATION AND SENSITIVITIES ARE REQUIRED. [facility]formed [facility]    Report Status 07/08/2015 FINAL  Final   Organism ID, Bacteria ENTEROCOCCUS SPECIES  Final      Susceptibility   Enterococcus species - MIC*    AMPICILLIN <=2 SENSITIVE Sensitive     VANCOMYCIN 1 SENSITIVE Sensitive     GENTAMICIN SYNERGY RESISTANT Resistant     * ENTEROCOCCUS SPECIES  MRSA PCR Screening     Status: None   Collection Time: 07/05/15 12:40 AM  Result Value Ref Range Status   MRSA by PCR NEGATIVE NEGATIVE Final    Comment:        The GeneXpert MRSA Assay (FDA approved for NASAL specimens only), is one component of a comprehensive MRSA colonization surveillance program. It is not intended to diagnose MRSA infection nor to guide or monitor treatment for MRSA infections.          Radiology Studies: No results found.      Scheduled Meds: . amLODipine  5 mg Oral Daily  . aspirin EC  81 mg Oral Daily  . feeding supplement (NEPRO CARB STEADY)  237 mL Oral TID WC  . feeding supplement (PRO-STAT SUGAR FREE 64)  30 mL Oral BID  . furosemide  80 mg Intravenous BID  . heparin  5,000 Units Subcutaneous Q8H  . HYDROcodone-acetaminophen  1 tablet Oral Q12H  . insulin aspart  0-9 Units Subcutaneous TID WC  . metoprolol tartrate  25 mg Oral BID  . sodium chloride flush  3 mL Intravenous Q12H   Continuous Infusions: .  sodium bicarbonate infusion 1/4 NS 1000 mL 125 mL/hr at 07/09/15 1004     LOS: 5 days     [name], Scheryl MartenSTEPHEN K, MD [facility] Pager [phone]  If 7PM-7AM, please contact night-coverage [email or web address] Password [email or web address]  07/09/2015, 1:49 PM

## 2015-07-10 DIAGNOSIS — L8990 Pressure ulcer of unspecified site, unspecified stage: Secondary | ICD-10-CM

## 2015-07-10 LAB — BASIC METABOLIC PANEL
Anion gap: 20 — ABNORMAL HIGH (ref 5–15)
BUN: 85 mg/dL — AB (ref 6–20)
CALCIUM: 6.4 mg/dL — AB (ref 8.9–10.3)
CHLORIDE: 103 mmol/L (ref 101–111)
CO2: 18 mmol/L — ABNORMAL LOW (ref 22–32)
CREATININE: 5.89 mg/dL — AB (ref 0.44–1.00)
GFR calc Af Amer: 7 mL/min — ABNORMAL LOW (ref >60)
GFR calc non Af Amer: 6 mL/min — ABNORMAL LOW (ref >60)
Glucose, Bld: 103 mg/dL — ABNORMAL HIGH (ref 65–99)
Potassium: 2.8 mmol/L — ABNORMAL LOW (ref 3.5–5.1)
SODIUM: 141 mmol/L (ref 135–145)

## 2015-07-10 LAB — GLUCOSE, CAPILLARY
GLUCOSE-CAPILLARY: 97 mg/dL (ref 65–99)
GLUCOSE-CAPILLARY: 119 mg/dL — AB (ref 65–99)
GLUCOSE-CAPILLARY: 124 mg/dL — AB (ref 65–99)
Glucose-Capillary: 121 mg/dL — ABNORMAL HIGH (ref 65–99)

## 2015-07-10 LAB — MAGNESIUM: Magnesium: 0.9 mg/dL — CL (ref 1.7–2.4)

## 2015-07-10 MED ORDER — MAGNESIUM SULFATE 2 GM/50ML IV SOLN
2.0000 g | Freq: Once | INTRAVENOUS | Status: AC
  Administered 2015-07-10: 2 g via INTRAVENOUS
  Filled 2015-07-10: qty 50

## 2015-07-10 MED ORDER — STERILE WATER FOR INJECTION IV SOLN
INTRAVENOUS | Status: DC
  Administered 2015-07-10 (×2): via INTRAVENOUS
  Filled 2015-07-10 (×4): qty 9.7

## 2015-07-10 MED ORDER — POTASSIUM CHLORIDE CRYS ER 20 MEQ PO TBCR: 40.0000 meq | EXTENDED_RELEASE_TABLET | Freq: Two times a day (BID) | ORAL | Status: DC

## 2015-07-10 MED ORDER — POTASSIUM CHLORIDE 10 MEQ/100ML IV SOLN
10.0000 meq | INTRAVENOUS | Status: AC
  Administered 2015-07-10 (×3): 10 meq via INTRAVENOUS
  Filled 2015-07-10 (×3): qty 100

## 2015-07-10 NOTE — Progress Notes (Signed)
PROGRESS NOTE    Sara GuarneriHelen W [name]  ZOX:[id number] DOB: 05/19/1933 DOA: 07/04/2015 PCP: Dwana MelenaZack Hall, MD  Outpatient Specialists:    Brief Narrative: [age] yof with a hx of dementia, HTN, CVA, chronic indwelling foley, PVD s/p bilateral BKA was recently started on gentamycin for a UTI with E. Coli / decubitus ulcer. It was noted that her renal function was abnormal and her Cr was elevated to 5. Her abx were stopped and she was started on IV hydration. Repeat labs showed worsening renal function, so she was brought to the ED. She does not eat or drink well and was very dehydrated. She is being treated with IV fluids, but has not had significant recovery of renal function as of yet. Nephrology is following.   Assessment & Plan:   Principal Problem:   Acute renal failure (HCC) Active Problems:   Essential hypertension   PERIPHERAL VASCULAR DISEASE   Dehydration   h/o Stroke   CAD (coronary artery disease)   DM (diabetes mellitus), type 2, uncontrolled, periph vascular complic (HCC)   Dementia   Metabolic acidosis   Lactic acidosis   Buttock wound   Pressure ulcer    Acute renal failure.   Likely due to prerenal azotemia in the setting of dehydration and ACE inhibitor/gentamycin.   Nephrology was consulted and is following.   Patient is continued on sodium bicarb infusion with IV lasix.   Continue to monitor urine output. Continue to monitor BMP. BUN and creatinine trending up.   Renal ultrasound without obstructive process.  Gram positive cocci bacteremia, 1/2 cultures positive for coagulase negative staph and enterococcus.   This culture was drawn from PICC line.   On 07/08/2015, Dr. Kerry HoughMemon had discussed with Dr. Anne HahnSynder with ID who recommended removing the PICC line, discontinuing antibiotics and then repeating blood cultures on 4/27  Will repeat blood cultures   Hypokalemia  Low this AM  Mg also low. Will replace both K and Mg  Hypomagnesemia  Will replace.    Metabolic acidosis.   Due to renal failure.   Continue IVFs.   Continue on bicarbonate infusion  Lactic acidosis.   Anticipate resolution with tx renal failure.   Continue IVFs.   Holding metformin.  Dehydration.  Clinically improved with IV fluids  Essential HTN.   Patient is continued on metoprolol and norvasc.   Continue hydralazine prn.   Continue to hold ace-inhibitor per above  DM type 2 with peripheral vascular complications.   Continue SSI.   Stage III Decubitus ulcer on buttock, present on admisison.   Does not appear to be actively infected.   Continue wound care.  [facility] requested follow up wound culture. Will order, however findings will likely be non-diagnostic at best  Dementia.   Stable.   DVT prophylaxis: Heparin subcutaneous Code Status: Full Family Communication: Patient in room Disposition Plan: Uncertain at this time  Consultants:   Nephrology  Procedures:     Antimicrobials:   Levaquin 4/21 >>4/25  Vanc 4/21 >> 4/25  Azactam 4/21 >> 4/22    Subjective: Difficult to obtain given sleepiness  Objective: Filed Vitals:   07/09/15 0902 07/09/15 1300 07/09/15 2020 07/10/15 0500  BP: 152/52 150/53 155/72 127/38  Pulse: 75 54 65 68  Temp:  97.9 F (36.6 C) 97.5 F (36.4 C) 97.6 F (36.4 C)  TempSrc:  Oral Oral Oral  Resp:  18 20 18   Height:      Weight:    59.4 kg (130 lb 15.3  oz)  SpO2:  100% 100% 100%    Intake/Output Summary (Last 24 hours) at 07/10/15 1415 Last data filed at 07/10/15 1100  Gross per 24 hour  Intake      0 ml  Output   1500 ml  Net  -1500 ml   Filed Weights   07/08/15 0616 07/09/15 0600 07/10/15 0500  Weight: 48.9 kg (107 lb 12.9 oz) 59.6 kg (131 lb 6.3 oz) 59.4 kg (130 lb 15.3 oz)    Examination:  General exam: Asleep, easily arousable, appears comfortable  Respiratory system: Clear to auscultation. Respiratory effort normal. Cardiovascular system: S1 & S2  heard, RRR.  Gastrointestinal system: Abdomen is nondistended, soft and nontender.  Normal bowel sounds heard. Central nervous system: Asleep, easily arousable. No focal neurological deficits. Extremities: Symmetric 5 x 5 power. Skin: No rashes, lesions or ulcers Psychiatry: Difficult to assess, patient drowsy    Data Reviewed: I have personally reviewed following labs and imaging studies  CBC:  Recent Labs Lab 07/04/15 1856 07/05/15 0135 07/05/15 0506 07/06/15 0515 07/09/15 0434  WBC 14.9* 15.7* 14.4* 11.2* 11.7*  NEUTROABS 13.5*  --   --   --   --   HGB 11.2* 10.0* 9.9* 9.7* 10.0*  HCT 34.0* 29.9* 29.3* 27.6* 28.3*  MCV 89.2 88.5 86.9 85.4 82.5  PLT 371 294 274 243 258   Basic Metabolic Panel:  Recent Labs Lab 07/06/15 0515 07/07/15 0455 07/08/15 0437 07/09/15 0434 07/10/15 0409  NA 144 138 135 137 141  K 3.3* 3.5 4.3 3.4* 2.8*  CL 116* 113* 111 107 103  CO2 15* 14* 12* 14* 18*  GLUCOSE 161* 197* 140* 89 103*  BUN 88* 84* 85* 82* 85*  CREATININE 5.63* 5.51* 5.71* 5.90* 5.89*  CALCIUM 6.8* 6.5* 6.5* 6.3* 6.4*  MG  --   --   --   --  0.9*  PHOS 5.2* 4.2 4.5  --   --    GFR: Estimated Creatinine Clearance: 7 mL/min (by C-G formula based on Cr of 5.89). Liver Function Tests:  Recent Labs Lab 07/04/15 1856 07/06/15 0515 07/07/15 0455 07/08/15 0437  AST 35  --   --   --   ALT <5*  --   --   --   ALKPHOS 63  --   --   --   BILITOT 0.5  --   --   --   PROT 6.5  --   --   --   ALBUMIN 2.5* 2.1* 1.9* 2.0*   No results for input(s): LIPASE, AMYLASE in the last 168 hours. No results for input(s): AMMONIA in the last 168 hours. Coagulation Profile: No results for input(s): INR, PROTIME in the last 168 hours. Cardiac Enzymes:  Recent Labs Lab 07/04/15 1856  TROPONINI 0.03   BNP (last 3 results) No results for input(s): PROBNP in the last 8760 hours. HbA1C: No results for input(s): HGBA1C in the last 72 hours. CBG:  Recent Labs Lab 07/09/15 0736  07/09/15 1632 07/09/15 2024 07/10/15 0712 07/10/15 1117  GLUCAP 87 100* 101* 97 119*   Lipid Profile: No results for input(s): CHOL, HDL, LDLCALC, TRIG, CHOLHDL, LDLDIRECT in the last 72 hours. Thyroid Function Tests: No results for input(s): TSH, T4TOTAL, FREET4, T3FREE, THYROIDAB in the last 72 hours. Anemia Panel: No results for input(s): VITAMINB12, FOLATE, FERRITIN, TIBC, IRON, RETICCTPCT in the last 72 hours. Urine analysis:    Component Value Date/Time   COLORURINE YELLOW 07/04/2015 1945   APPEARANCEUR CLOUDY* 07/04/2015 1945  LABSPEC 1.025 07/04/2015 1945   PHURINE 5.5 07/04/2015 1945   GLUCOSEU 500* 07/04/2015 1945   HGBUR MODERATE* 07/04/2015 1945   BILIRUBINUR NEGATIVE 07/04/2015 1945   KETONESUR TRACE* 07/04/2015 1945   PROTEINUR 100* 07/04/2015 1945   UROBILINOGEN 0.2 01/11/2014 2247   NITRITE NEGATIVE 07/04/2015 1945   LEUKOCYTESUR MODERATE* 07/04/2015 1945   Sepsis Labs: (procalcitonin:4,lacticidven:4)  ) Recent Results (from the past 240 hour(s))  Blood Culture (routine x 2)     Status: None   Collection Time: 07/04/15  6:56 PM  Result Value Ref Range Status   Specimen Description LEFT ANTECUBITAL  Final   Special Requests BOTTLES DRAWN AEROBIC ONLY 4CC ONLY  Final   Culture NO GROWTH 5 DAYS  Final   Report Status 07/09/2015 FINAL  Final  Urine culture     Status: None   Collection Time: 07/04/15  7:45 PM  Result Value Ref Range Status   Specimen Description URINE, RANDOM  Final   Special Requests NONE  Final   Culture MULTIPLE SPECIES PRESENT, SUGGEST RECOLLECTION  Final   Report Status 07/07/2015 FINAL  Final  Blood Culture (routine x 2)     Status: Abnormal   Collection Time: 07/04/15  8:28 PM  Result Value Ref Range Status   Specimen Description BLOOD PICC LINE DRAWN BY RN  Final   Special Requests BOTTLES DRAWN AEROBIC AND ANAEROBIC [facility] EACH  Final   Culture  Setup Time   Final    GRAM POSITIVE COCCI Gram Stain Report Called  to,Read Back By and Verified With: BULILNS,L AT 1910 ON 07/05/2015 BY AGUNDIZ,E. GRAM POSITIVE COCCI Gram Stain Report Called to,Read Back By and Verified With: MCGIBBONEY,C. AT 1930 ON 07/05/2015 BY AGUNDIZ,E.    Culture (A)  Final    ENTEROCOCCUS SPECIES STAPHYLOCOCCUS SPECIES (COAGULASE NEGATIVE) THE SIGNIFICANCE OF ISOLATING THIS ORGANISM FROM A SINGLE SET OF BLOOD CULTURES WHEN MULTIPLE SETS ARE DRAWN IS UNCERTAIN. PLEASE NOTIFY THE MICROBIOLOGY DEPARTMENT WITHIN ONE WEEK IF SPECIATION AND SENSITIVITIES ARE REQUIRED. Performed at [facility]    Report Status 07/08/2015 FINAL  Final   Organism ID, Bacteria ENTEROCOCCUS SPECIES  Final      Susceptibility   Enterococcus species - MIC*    AMPICILLIN <=2 SENSITIVE Sensitive     VANCOMYCIN 1 SENSITIVE Sensitive     GENTAMICIN SYNERGY RESISTANT Resistant     * ENTEROCOCCUS SPECIES  MRSA PCR Screening     Status: None   Collection Time: 07/05/15 12:40 AM  Result Value Ref Range Status   MRSA by PCR NEGATIVE NEGATIVE Final    Comment:        The GeneXpert MRSA Assay (FDA approved for NASAL specimens only), is one component of a comprehensive MRSA colonization surveillance program. It is not intended to diagnose MRSA infection nor to guide or monitor treatment for MRSA infections.          Radiology Studies: No results found.      Scheduled Meds: . amLODipine  5 mg Oral Daily  . aspirin EC  81 mg Oral Daily  . feeding supplement (NEPRO CARB STEADY)  237 mL Oral TID WC  . feeding supplement (PRO-STAT SUGAR FREE 64)  30 mL Oral BID  . furosemide  80 mg Intravenous BID  . heparin  5,000 Units Subcutaneous Q8H  . HYDROcodone-acetaminophen  1 tablet Oral Q12H  . insulin aspart  0-9 Units Subcutaneous TID WC  . magnesium sulfate 1 - 4 g bolus IVPB  2 g Intravenous  Once  . metoprolol tartrate  25 mg Oral BID  . potassium chloride  10 mEq Intravenous Q1 Hr x 3  . sodium chloride flush  3 mL Intravenous Q12H    Continuous Infusions: .  sodium bicarbonate infusion 1/4 NS 1000 mL 125 mL/hr at 07/10/15 1336     LOS: 6 days     CHIU, Scheryl Marten, MD [facility] Pager [phone]  If 7PM-7AM, please contact night-coverage [email or web address] Password [email or web address] 07/10/2015, 2:15 PM

## 2015-07-11 LAB — GLUCOSE, CAPILLARY
Glucose-Capillary: 117 mg/dL — ABNORMAL HIGH (ref 65–99)
Glucose-Capillary: 126 mg/dL — ABNORMAL HIGH (ref 65–99)
Glucose-Capillary: 123 mg/dL — ABNORMAL HIGH (ref 65–99)
Glucose-Capillary: 114 mg/dL — ABNORMAL HIGH (ref 65–99)

## 2015-07-11 LAB — RENAL FUNCTION PANEL
Albumin: 1.9 g/dL — ABNORMAL LOW (ref 3.5–5.0)
Anion gap: 20 — ABNORMAL HIGH (ref 5–15)
BUN: 84 mg/dL — ABNORMAL HIGH (ref 6–20)
CALCIUM: 6.3 mg/dL — AB (ref 8.9–10.3)
CO2: 18 mmol/L — ABNORMAL LOW (ref 22–32)
CREATININE: 6.19 mg/dL — AB (ref 0.44–1.00)
Chloride: 98 mmol/L — ABNORMAL LOW (ref 101–111)
GFR, EST AFRICAN AMERICAN: 7 mL/min — AB (ref >60)
GFR, EST NON AFRICAN AMERICAN: 6 mL/min — AB (ref >60)
Glucose, Bld: 122 mg/dL — ABNORMAL HIGH (ref 65–99)
PHOSPHORUS: 5.3 mg/dL — AB (ref 2.5–4.6)
Potassium: 2.8 mmol/L — ABNORMAL LOW (ref 3.5–5.1)
SODIUM: 136 mmol/L (ref 135–145)

## 2015-07-11 LAB — BASIC METABOLIC PANEL
Anion gap: 14 (ref 5–15)
BUN: 79 mg/dL — AB (ref 6–20)
CHLORIDE: 99 mmol/L — AB (ref 101–111)
CO2: 20 mmol/L — ABNORMAL LOW (ref 22–32)
Calcium: 6.6 mg/dL — ABNORMAL LOW (ref 8.9–10.3)
Creatinine, Ser: 5.82 mg/dL — ABNORMAL HIGH (ref 0.44–1.00)
GFR calc Af Amer: 7 mL/min — ABNORMAL LOW (ref >60)
GFR calc non Af Amer: 6 mL/min — ABNORMAL LOW (ref >60)
GLUCOSE: 112 mg/dL — AB (ref 65–99)
POTASSIUM: 3.8 mmol/L (ref 3.5–5.1)
Sodium: 133 mmol/L — ABNORMAL LOW (ref 135–145)

## 2015-07-11 LAB — MAGNESIUM
MAGNESIUM: 1.4 mg/dL — AB (ref 1.7–2.4)
MAGNESIUM: 1.9 mg/dL (ref 1.7–2.4)

## 2015-07-11 MED ORDER — POTASSIUM CHLORIDE 10 MEQ/100ML IV SOLN
10.0000 meq | INTRAVENOUS | Status: AC
  Administered 2015-07-11 (×4): 10 meq via INTRAVENOUS
  Filled 2015-07-11: qty 100

## 2015-07-11 MED ORDER — FUROSEMIDE 10 MG/ML IJ SOLN
40.0000 mg | Freq: Two times a day (BID) | INTRAMUSCULAR | Status: DC
Start: 2015-07-11 — End: 2015-07-17
  Administered 2015-07-11 – 2015-07-16 (×12): 40 mg via INTRAVENOUS
  Filled 2015-07-11 (×12): qty 4

## 2015-07-11 MED ORDER — SODIUM CHLORIDE 0.45 % IV SOLN
INTRAVENOUS | Status: DC
  Administered 2015-07-11 – 2015-07-12 (×5): via INTRAVENOUS
  Filled 2015-07-11 (×11): qty 1000

## 2015-07-11 MED ORDER — CALCIUM CARBONATE ANTACID 500 MG PO CHEW
2.0000 | CHEWABLE_TABLET | Freq: Once | ORAL | Status: AC
  Administered 2015-07-11: 400 mg via ORAL
  Filled 2015-07-11: qty 2

## 2015-07-11 MED ORDER — MAGNESIUM SULFATE 2 GM/50ML IV SOLN
2.0000 g | Freq: Once | INTRAVENOUS | Status: AC
  Administered 2015-07-11: 2 g via INTRAVENOUS
  Filled 2015-07-11: qty 50

## 2015-07-11 NOTE — Progress Notes (Signed)
Pt had a 13 beat run of VTach. MD Aware

## 2015-07-11 NOTE — Care Management Important Message (Signed)
Important Message  Patient Details  Name: Marnee GuarneriHelen W Kamps MRN: [id number] Date of Birth: Jan 22, 1934   Medicare Important Message Given:  Yes    Malcolm MetroChildress, Laterra Lubinski Demske, RN 07/11/2015, 10:22 AM

## 2015-07-11 NOTE — Progress Notes (Signed)
Notified MD, pt had 10 beat run of V Tach nonsustained. Awaiting response.

## 2015-07-11 NOTE — Progress Notes (Signed)
Subjective: Interval History: Patient offers no complaints.  Objective: Vital signs in last 24 hours: Temp:  [98 F (36.7 C)] 98 F (36.7 C) (04/28 0658) Pulse Rate:  [58-61] 61 (04/28 0658) Resp:  [20] 20 (04/28 0658) BP: (123-132)/(48-91) 132/48 mmHg (04/28 0658) SpO2:  [95 %-100 %] 98 % (04/28 0658) Weight change:   Intake/Output from previous day: 04/27 0701 - 04/28 0700 In: 0  Out: 1800 [Urine:1800] Intake/Output this shift:   Generally patient is alert and in no apparent distress Chest is clear to auscultation Heart examination regular rate and rhythm no murmur or S3 Extremities: She is status post bilateral BKA and no edema   Lab Results:  Recent Labs  07/09/15 0434  WBC 11.7*  HGB 10.0*  HCT 28.3*  PLT 258   BMET:   Recent Labs  07/10/15 0409 07/11/15 0424  NA 141 136  K 2.8* 2.8*  CL 103 98*  CO2 18* 18*  GLUCOSE 103* 122*  BUN 85* 84*  CREATININE 5.89* 6.19*  CALCIUM 6.4* 6.3*   No results for input(s): PTH in the last 72 hours. Iron Studies: No results for input(s): IRON, TIBC, TRANSFERRIN, FERRITIN in the last 72 hours.  Studies/Results: No results found.  I have reviewed the patient's current medications.  Assessment/Plan: Problem #1 acute kidney injury superimposed on chronic. Thought to be secondary to gentamicin/ACE/vancomycin. Her renal function continued to decline. Because of her dementia very difficult to know whether patient has uremic signs and symptoms. Patient has 1800 mL of urine output. Problem #2 hypokalemia: Patient is on potassium supplement but her potassium remains low. Problem #3. Low CO2: Possibly metabolic. Patient on sodium supplement. Problem #4 anemia: Her hemoglobin is below our target goal. Etiology as this moment is no clear. Problem #5 history of diabetes Problem #6 urinary tract infection patient on antibiotics:  Problem #7 history of CVA Problem #8. Metabolic bone disease her calcium is low and phosphorus is  slightly high. Plan: 1] will change IV fluid to half normal saline with 40 mEq of KCl at 1 65 mL per hour 2] will decrease Lasix to 40 mg IV twice a day 3] if her renal function continue to decline we'll consider dialysis. 4] check her renal panel in the morning.   LOS: 7 days   Kambrea Carrasco S 07/11/2015,8:01 AM

## 2015-07-11 NOTE — Progress Notes (Addendum)
PROGRESS NOTE    Sara GuarneriHelen W [name]  ZOX:[id number] DOB: 04/15/1933 DOA: 07/04/2015 PCP: Dwana MelenaZack Hall, MD  Outpatient Specialists:    Brief Narrative: [age] yof with a hx of dementia, HTN, CVA, chronic indwelling foley, PVD s/p bilateral BKA was recently started on gentamycin for a UTI with E. Coli / decubitus ulcer. It was noted that her renal function was abnormal and her Cr was elevated to 5. Her abx were stopped and she was started on IV hydration. Repeat labs showed worsening renal function, so she was brought to the ED. She does not eat or drink well and was very dehydrated. She is being treated with IV fluids, but has not had significant recovery of renal function as of yet. Nephrology is following.   Assessment & Plan:   Principal Problem:   Acute renal failure (HCC) Active Problems:   Essential hypertension   PERIPHERAL VASCULAR DISEASE   Dehydration   h/o Stroke   CAD (coronary artery disease)   DM (diabetes mellitus), type 2, uncontrolled, periph vascular complic (HCC)   Dementia   Metabolic acidosis   Lactic acidosis   Buttock wound   Pressure ulcer    Acute renal failure.   Likely due to prerenal azotemia in the setting of dehydration and ACE inhibitor/gentamycin.   Nephrology was consulted and continues to follow  Patient is continued on IVF infusion with IV lasix.   Continue to monitor urine output. Continue to monitor BMP. BUN and creatinine continue trending up.   Renal ultrasound without obstructive process.  If no significant improvement, consideration to start HD  Gram positive cocci bacteremia, 1/2 cultures positive for coagulase negative staph and enterococcus.   This culture was drawn from PICC line.   On 07/08/2015, Dr. Kerry HoughMemon had discussed with Dr. Anne HahnSynder with ID who recommended removing the PICC line, discontinuing antibiotics and then repeating blood cultures on 4/27  Repeat blood cultures neg thus far x 2  Hypokalemia  Remains low  Cont to  replace as tolerated  Hypomagnesemia  Improved but still low  Cont to replace  Metabolic acidosis.   Due to renal failure.   Continue IVFs.   Continue on bicarbonate infusion  Lactic acidosis.   Anticipate resolution with tx renal failure.   Continue IVFs.   Holding metformin.  Dehydration.  Clinically improved with IV fluids  Essential HTN.   Patient is continued on metoprolol and norvasc.   Continue hydralazine prn.   Continue to hold ace-inhibitor per above  DM type 2 with peripheral vascular complications.   Continue SSI.   Stage III Decubitus ulcer on buttock, present on admisison.   Does not appear to be actively infected.   Continue wound care.  Skilled Nursing Agency requested follow up wound culture. Will order, however findings will likely be non-diagnostic at best, pending  Dementia.   Stable.   DVT prophylaxis: Heparin subcutaneous Code Status: Full Family Communication: Patient in room, updated pt's daughter over phone Disposition Plan: Uncertain at this time  Consultants:   Nephrology  Procedures:     Antimicrobials:   Levaquin 4/21 >>4/25  Vanc 4/21 >> 4/25  Azactam 4/21 >> 4/22    Subjective: Pt drowsy this AM  Objective: Filed Vitals:   07/10/15 0500 07/10/15 2108 07/10/15 2109 07/11/15 0658  BP: 127/38 123/91  132/48  Pulse: 68 58  61  Temp: 97.6 F (36.4 C)   98 F (36.7 C)  TempSrc: Oral   Oral  Resp: 18 20  20   Height:  Weight: 59.4 kg (130 lb 15.3 oz)     SpO2: 100% 100% 95% 98%    Intake/Output Summary (Last 24 hours) at 07/11/15 1404 Last data filed at 07/11/15 1300  Gross per 24 hour  Intake    120 ml  Output   1750 ml  Net  -1630 ml   Filed Weights   07/08/15 0616 07/09/15 0600 07/10/15 0500  Weight: 48.9 kg (107 lb 12.9 oz) 59.6 kg (131 lb 6.3 oz) 59.4 kg (130 lb 15.3 oz)    Examination:  General exam: Asleep, easily arousable, appears comfortable, laying in bed Respiratory  system: Clear to auscultation. Respiratory effort normal. Cardiovascular system: S1 & S2 heard, RRR.  Gastrointestinal system: Abdomen is nondistended, soft and nontender.  Normal bowel sounds heard. Central nervous system: Asleep, easily arousable. No focal neurological deficits. Extremities: Symmetric 5 x 5 power. Skin: No rashes, lesions Psychiatry: Difficult to fully assess, patient drowsy    Data Reviewed: I have personally reviewed following labs and imaging studies  CBC:  Recent Labs Lab 07/04/15 1856 07/05/15 0135 07/05/15 0506 07/06/15 0515 07/09/15 0434  WBC 14.9* 15.7* 14.4* 11.2* 11.7*  NEUTROABS 13.5*  --   --   --   --   HGB 11.2* 10.0* 9.9* 9.7* 10.0*  HCT 34.0* 29.9* 29.3* 27.6* 28.3*  MCV 89.2 88.5 86.9 85.4 82.5  PLT 371 294 274 243 258   Basic Metabolic Panel:  Recent Labs Lab 07/06/15 0515 07/07/15 0455 07/08/15 0437 07/09/15 0434 07/10/15 0409 07/11/15 0424  NA 144 138 135 137 141 136  K 3.3* 3.5 4.3 3.4* 2.8* 2.8*  CL 116* 113* 111 107 103 98*  CO2 15* 14* 12* 14* 18* 18*  GLUCOSE 161* 197* 140* 89 103* 122*  BUN 88* 84* 85* 82* 85* 84*  CREATININE 5.63* 5.51* 5.71* 5.90* 5.89* 6.19*  CALCIUM 6.8* 6.5* 6.5* 6.3* 6.4* 6.3*  MG  --   --   --   --  0.9* 1.4*  PHOS 5.2* 4.2 4.5  --   --  5.3*   GFR: Estimated Creatinine Clearance: 6.6 mL/min (by C-G formula based on Cr of 6.19). Liver Function Tests:  Recent Labs Lab 07/04/15 1856 07/06/15 0515 07/07/15 0455 07/08/15 0437 07/11/15 0424  AST 35  --   --   --   --   ALT <5*  --   --   --   --   ALKPHOS 63  --   --   --   --   BILITOT 0.5  --   --   --   --   PROT 6.5  --   --   --   --   ALBUMIN 2.5* 2.1* 1.9* 2.0* 1.9*   No results for input(s): LIPASE, AMYLASE in the last 168 hours. No results for input(s): AMMONIA in the last 168 hours. Coagulation Profile: No results for input(s): INR, PROTIME in the last 168 hours. Cardiac Enzymes:  Recent Labs Lab 07/04/15 1856    TROPONINI 0.03   BNP (last 3 results) No results for input(s): PROBNP in the last 8760 hours. HbA1C: No results for input(s): HGBA1C in the last 72 hours. CBG:  Recent Labs Lab 07/10/15 1117 07/10/15 1705 07/10/15 2111 07/11/15 0735 07/11/15 1155  GLUCAP 119* 124* 121* 117* 126*   Lipid Profile: No results for input(s): CHOL, HDL, LDLCALC, TRIG, CHOLHDL, LDLDIRECT in the last 72 hours. Thyroid Function Tests: No results for input(s): TSH, T4TOTAL, FREET4, T3FREE, THYROIDAB in the last  72 hours. Anemia Panel: No results for input(s): VITAMINB12, FOLATE, FERRITIN, TIBC, IRON, RETICCTPCT in the last 72 hours. Urine analysis:    Component Value Date/Time   COLORURINE YELLOW 07/04/2015 1945   APPEARANCEUR CLOUDY* 07/04/2015 1945   LABSPEC 1.025 07/04/2015 1945   PHURINE 5.5 07/04/2015 1945   GLUCOSEU 500* 07/04/2015 1945   HGBUR MODERATE* 07/04/2015 1945   BILIRUBINUR NEGATIVE 07/04/2015 1945   KETONESUR TRACE* 07/04/2015 1945   PROTEINUR 100* 07/04/2015 1945   UROBILINOGEN 0.2 01/11/2014 2247   NITRITE NEGATIVE 07/04/2015 1945   LEUKOCYTESUR MODERATE* 07/04/2015 1945   Sepsis Labs: @LABRCNTIP (procalcitonin:4,lacticidven:4)  ) Recent Results (from the past 240 hour(s))  Blood Culture (routine x 2)     Status: None   Collection Time: 07/04/15  6:56 PM  Result Value Ref Range Status   Specimen Description LEFT ANTECUBITAL  Final   Special Requests BOTTLES DRAWN AEROBIC ONLY 4CC ONLY  Final   Culture NO GROWTH 5 DAYS  Final   Report Status 07/09/2015 FINAL  Final  Urine culture     Status: None   Collection Time: 07/04/15  7:45 PM  Result Value Ref Range Status   Specimen Description URINE, RANDOM  Final   Special Requests NONE  Final   Culture MULTIPLE SPECIES PRESENT, SUGGEST RECOLLECTION  Final   Report Status 07/07/2015 FINAL  Final  Blood Culture (routine x 2)     Status: Abnormal   Collection Time: 07/04/15  8:28 PM  Result Value Ref Range Status    Specimen Description BLOOD PICC LINE DRAWN BY RN  Final   Special Requests BOTTLES DRAWN AEROBIC AND ANAEROBIC [facility] EACH  Final   Culture  Setup Time   Final    GRAM POSITIVE COCCI Gram Stain Report Called to,Read Back By and Verified With: BULILNS,L AT 1910 ON 07/05/2015 BY AGUNDIZ,E. GRAM POSITIVE COCCI Gram Stain Report Called to,Read Back By and Verified With: MCGIBBONEY,C. AT 1930 ON 07/05/2015 BY AGUNDIZ,E.    Culture (A)  Final    ENTEROCOCCUS SPECIES STAPHYLOCOCCUS SPECIES (COAGULASE NEGATIVE) THE SIGNIFICANCE OF ISOLATING THIS ORGANISM FROM A SINGLE SET OF BLOOD CULTURES WHEN MULTIPLE SETS ARE DRAWN IS UNCERTAIN. PLEASE NOTIFY THE MICROBIOLOGY DEPARTMENT WITHIN ONE WEEK IF SPECIATION AND SENSITIVITIES ARE REQUIRED. Performed at [facility]    Report Status 07/08/2015 FINAL  Final   Organism ID, Bacteria ENTEROCOCCUS SPECIES  Final      Susceptibility   Enterococcus species - MIC*    AMPICILLIN <=2 SENSITIVE Sensitive     VANCOMYCIN 1 SENSITIVE Sensitive     GENTAMICIN SYNERGY RESISTANT Resistant     * ENTEROCOCCUS SPECIES  MRSA PCR Screening     Status: None   Collection Time: 07/05/15 12:40 AM  Result Value Ref Range Status   MRSA by PCR NEGATIVE NEGATIVE Final    Comment:        The GeneXpert MRSA Assay (FDA approved for NASAL specimens only), is one component of a comprehensive MRSA colonization surveillance program. It is not intended to diagnose MRSA infection nor to guide or monitor treatment for MRSA infections.   Culture, blood (routine x 2)     Status: None (Preliminary result)   Collection Time: 07/10/15  2:26 PM  Result Value Ref Range Status   Specimen Description BLOOD LEFT ANTECUBITAL  Final   Special Requests BOTTLES DRAWN AEROBIC AND ANAEROBIC 6CC  Final   Culture NO GROWTH < 24 HOURS  Final   Report Status PENDING  Incomplete  Culture, blood (  routine x 2)     Status: None (Preliminary result)   Collection Time: 07/10/15  4:46 PM  Result  Value Ref Range Status   Specimen Description BLOOD PICC LINE DRAWN BY RN  Final   Special Requests BOTTLES DRAWN AEROBIC AND ANAEROBIC 10CC EACH  Final   Culture NO GROWTH < 24 HOURS  Final   Report Status PENDING  Incomplete         Radiology Studies: No results found.      Scheduled Meds: . amLODipine  5 mg Oral Daily  . aspirin EC  81 mg Oral Daily  . feeding supplement (NEPRO CARB STEADY)  237 mL Oral TID WC  . feeding supplement (PRO-STAT SUGAR FREE 64)  30 mL Oral BID  . furosemide  40 mg Intravenous BID  . heparin  5,000 Units Subcutaneous Q8H  . HYDROcodone-acetaminophen  1 tablet Oral Q12H  . insulin aspart  0-9 Units Subcutaneous TID WC  . metoprolol tartrate  25 mg Oral BID  . sodium chloride flush  3 mL Intravenous Q12H   Continuous Infusions: . sodium chloride 0.45 % with kcl 135 mL/hr at 07/11/15 1043     LOS: 7 days     CHIU, Scheryl Marten, MD [facility] Pager [phone]  If 7PM-7AM, please contact night-coverage [email or web address] Password [facility] 07/11/2015, 2:04 PM

## 2015-07-11 NOTE — Clinical Social Work Note (Signed)
CSW updated [facility] on pt. Facility remains willing to accept pt when medically stable.  Audric Venn, LCSW [phone] 

## 2015-07-12 LAB — RENAL FUNCTION PANEL
Albumin: 2 g/dL — ABNORMAL LOW (ref 3.5–5.0)
Anion gap: 13 (ref 5–15)
BUN: 79 mg/dL — AB (ref 6–20)
CHLORIDE: 100 mmol/L — AB (ref 101–111)
CO2: 20 mmol/L — AB (ref 22–32)
CREATININE: 5.52 mg/dL — AB (ref 0.44–1.00)
Calcium: 6.8 mg/dL — ABNORMAL LOW (ref 8.9–10.3)
GFR, EST AFRICAN AMERICAN: 8 mL/min — AB (ref >60)
GFR, EST NON AFRICAN AMERICAN: 7 mL/min — AB (ref >60)
Glucose, Bld: 124 mg/dL — ABNORMAL HIGH (ref 65–99)
POTASSIUM: 4.2 mmol/L (ref 3.5–5.1)
Phosphorus: 4.1 mg/dL (ref 2.5–4.6)
Sodium: 133 mmol/L — ABNORMAL LOW (ref 135–145)

## 2015-07-12 LAB — GLUCOSE, CAPILLARY
GLUCOSE-CAPILLARY: 157 mg/dL — AB (ref 65–99)
Glucose-Capillary: 106 mg/dL — ABNORMAL HIGH (ref 65–99)
Glucose-Capillary: 132 mg/dL — ABNORMAL HIGH (ref 65–99)
Glucose-Capillary: 167 mg/dL — ABNORMAL HIGH (ref 65–99)

## 2015-07-12 LAB — MAGNESIUM: Magnesium: 1.7 mg/dL (ref 1.7–2.4)

## 2015-07-12 MED ORDER — AMIODARONE HCL 200 MG PO TABS
200.0000 mg | ORAL_TABLET | Freq: Two times a day (BID) | ORAL | Status: DC
  Administered 2015-07-12 – 2015-07-13 (×2): 200 mg via ORAL
  Filled 2015-07-12 (×2): qty 1

## 2015-07-12 MED ORDER — MAGNESIUM SULFATE 2 GM/50ML IV SOLN
2.0000 g | Freq: Once | INTRAVENOUS | Status: AC
  Administered 2015-07-12: 2 g via INTRAVENOUS
  Filled 2015-07-12: qty 50

## 2015-07-12 MED ORDER — NEPRO/CARBSTEADY PO LIQD
237.0000 mL | Freq: Three times a day (TID) | ORAL | Status: DC
  Administered 2015-07-12 – 2015-07-21 (×12): 237 mL via ORAL

## 2015-07-12 NOTE — Progress Notes (Signed)
Nutrition Follow-up  DOCUMENTATION CODES:   Not applicable  INTERVENTION:   -Continue 30 ml Prostat BID, each supplement provides 100 kcals and 15 grams protein -Nepro Shake po TID, each supplement provides 425 kcal and 19 grams protein  NUTRITION DIAGNOSIS:   Increased nutrient needs related to acute illness as evidenced by estimated needs.  Ongoing  GOAL:   Patient will meet greater than or equal to 90% of their needs  Unmet  MONITOR:   Skin, Supplement acceptance, Diet advancement, Labs, PO intake  REASON FOR ASSESSMENT:   Malnutrition Screening Tool    ASSESSMENT:   [age] yof with a hx of dementia, HTN, CVA, chronic indwelling foley, PVD s/p bilateral BKA was recently started on gentamycin for a UTI with E. Coli / decubitus ulcer. It was noted that her renal function was abnormal and her Cr was elevated to 5. Her abx were stopped and she was started on IV hydration. Repeat labs showed worsening renal function, so she was brought to the ED. She does not eat or drink well and was very dehydrated. She was admitted to telemetry for sepsis/UTI/ARF.   Pt admitted with ARF; nephology following. Per MD notes, no significant recovery in renal function with consideration of initiating HD.  Pt is on a dysphagia 3 diet with thin liquids. Intake remains very poor; PO: 0% per doc flowsheet records. Per MAR, pt is accepting Prostat supplement. RD will also order Nepro shake due to increased nutrient density to assist with optimizing PO intake and to promote wound healing.   Reviewed CWOCN note from 07/05/15; pt with stage III pressure injury to rt ischial tuberosity.   CSW following; plan to d/c back to [facility] once medically stable.   Labs reviewed: Na: 133, CBGS: 106-123.   Diet Order:  DIET DYS 3 Room service appropriate?: Yes; Fluid consistency:: Thin  Skin:  Wound (see comment) (stage III rt ischial tuberosity)  Last BM:  07/08/15  Height:   Ht Readings from Last 1  Encounters:  07/08/15 5\' 5"  (1.651 m)    Weight:   Wt Readings from Last 1 Encounters:  07/10/15 130 lb 15.3 oz (59.4 kg)    Ideal Body Weight:  47.5 kg  BMI:  Body mass index is 21.79 kg/(m^2).  Estimated Nutritional Needs:   Kcal:  1500-1700  Protein:  70-85 grams  Fluid:  >1.5 L  EDUCATION NEEDS:   No education needs identified at this time  Avira Tillison A. Mayford KnifeWilliams, RD, LDN, CDE Pager: [phone] After hours Pager: [phone]

## 2015-07-12 NOTE — Progress Notes (Signed)
Sara GuarneriHelen W [name]  MRN: [id number]  DOB/AGE: December 27, 1933 82 y.o.  Primary Care Physician:Zack Margo AyeHall, MD  Admit date: 07/04/2015  Chief Complaint:  Chief Complaint  Patient presents with  . Abnormal Labs     S-Pt presented on  07/04/2015 with  Chief Complaint  Patient presents with  . Abnormal Labs   .    Pt unable to offer any complaints.Pt son and daughter are present in the room.      Meds  . amLODipine  5 mg Oral Daily  . aspirin EC  81 mg Oral Daily  . feeding supplement (NEPRO CARB STEADY)  237 mL Oral TID WC  . feeding supplement (NEPRO CARB STEADY)  237 mL Oral TID BM  . feeding supplement (PRO-STAT SUGAR FREE 64)  30 mL Oral BID  . furosemide  40 mg Intravenous BID  . heparin  5,000 Units Subcutaneous Q8H  . HYDROcodone-acetaminophen  1 tablet Oral Q12H  . insulin aspart  0-9 Units Subcutaneous TID WC  . metoprolol tartrate  25 mg Oral BID  . sodium chloride flush  3 mL Intravenous Q12H      Physical Exam: Vital signs in last 24 hours: Temp:  [96.8 F (36 C)-97.6 F (36.4 C)] 96.8 F (36 C) (04/29 0522) Pulse Rate:  [52-98] 60 (04/29 0522) Resp:  [20] 20 (04/29 0522) BP: (106-132)/(46-77) 106/77 mmHg (04/29 0522) SpO2:  [95 %-100 %] 95 % (04/29 0522) Weight change:  Last BM Date: 07/08/15  Intake/Output from previous day: 04/28 0701 - 04/29 0700 In: 120 [P.O.:120] Out: 950 [Urine:950]     Physical Exam: General- pt does not communicate Resp- No acute REsp distress, decreased at bases. CVS- S1S2 regular in rate and rhythm, SEM + GIT- BS+, soft, NT, ND EXT- b/L BKA, trace Edema,No Cyanosis   Lab Results: HGb  10.0  BMET  Recent Labs  07/11/15 2150 07/12/15 0636  NA 133* 133*  K 3.8 4.2  CL 99* 100*  CO2 20* 20*  GLUCOSE 112* 124*  BUN 79* 79*  CREATININE 5.82* 5.52*  CALCIUM 6.6* 6.8*   Creat trend 2017  6.1=>5.8=>5.5    MICRO Recent Results (from the past 240 hour(s))  Blood Culture (routine x 2)     Status: None   Collection  Time: 07/04/15  6:56 PM  Result Value Ref Range Status   Specimen Description LEFT ANTECUBITAL  Final   Special Requests BOTTLES DRAWN AEROBIC ONLY 4CC ONLY  Final   Culture NO GROWTH 5 DAYS  Final   Report Status 07/09/2015 FINAL  Final  Urine culture     Status: None   Collection Time: 07/04/15  7:45 PM  Result Value Ref Range Status   Specimen Description URINE, RANDOM  Final   Special Requests NONE  Final   Culture MULTIPLE SPECIES PRESENT, SUGGEST RECOLLECTION  Final   Report Status 07/07/2015 FINAL  Final  Blood Culture (routine x 2)     Status: Abnormal   Collection Time: 07/04/15  8:28 PM  Result Value Ref Range Status   Specimen Description BLOOD PICC LINE DRAWN BY RN  Final   Special Requests BOTTLES DRAWN AEROBIC AND ANAEROBIC [facility]6CC EACH  Final   Culture  Setup Time   Final    GRAM POSITIVE COCCI Gram Stain Report Called to,Read Back By and Verified With: BULILNS,L AT 1910 ON 07/05/2015 BY AGUNDIZ,E. GRAM POSITIVE COCCI Gram Stain Report Called to,Read Back By and Verified With: MCGIBBONEY,C. AT 1930 ON 07/05/2015 BY AGUNDIZ,E.  Culture (A)  Final    ENTEROCOCCUS SPECIES STAPHYLOCOCCUS SPECIES (COAGULASE NEGATIVE) THE SIGNIFICANCE OF ISOLATING THIS ORGANISM FROM A SINGLE SET OF BLOOD CULTURES WHEN MULTIPLE SETS ARE DRAWN IS UNCERTAIN. PLEASE NOTIFY THE MICROBIOLOGY DEPARTMENT WITHIN ONE WEEK IF SPECIATION AND SENSITIVITIES ARE REQUIRED. Performed at [facility]    Report Status 07/08/2015 FINAL  Final   Organism ID, Bacteria ENTEROCOCCUS SPECIES  Final      Susceptibility   Enterococcus species - MIC*    AMPICILLIN <=2 SENSITIVE Sensitive     VANCOMYCIN 1 SENSITIVE Sensitive     GENTAMICIN SYNERGY RESISTANT Resistant     * ENTEROCOCCUS SPECIES  MRSA PCR Screening     Status: None   Collection Time: 07/05/15 12:40 AM  Result Value Ref Range Status   MRSA by PCR NEGATIVE NEGATIVE Final    Comment:        The GeneXpert MRSA Assay (FDA approved for NASAL  specimens only), is one component of a comprehensive MRSA colonization surveillance program. It is not intended to diagnose MRSA infection nor to guide or monitor treatment for MRSA infections.   Culture, blood (routine x 2)     Status: None (Preliminary result)   Collection Time: 07/10/15  2:26 PM  Result Value Ref Range Status   Specimen Description BLOOD LEFT ANTECUBITAL  Final   Special Requests BOTTLES DRAWN AEROBIC AND ANAEROBIC 6CC  Final   Culture NO GROWTH 2 DAYS  Final   Report Status PENDING  Incomplete  Culture, blood (routine x 2)     Status: None (Preliminary result)   Collection Time: 07/10/15  4:46 PM  Result Value Ref Range Status   Specimen Description BLOOD PICC LINE DRAWN BY RN  Final   Special Requests BOTTLES DRAWN AEROBIC AND ANAEROBIC 10CC EACH  Final   Culture NO GROWTH 2 DAYS  Final   Report Status PENDING  Incomplete      Lab Results  Component Value Date   CALCIUM 6.8* 07/12/2015   PHOS 4.1 07/12/2015   Alb 2.0 Corrected calcium 6.5+1.6=8.1     Impression: 1)Renal  AKI secondary to ATN               AKI sec to Gentamycin /ACE               Non oliguric ATN  2)HTN  Medication- On Diuretics- On Calcium Channel Blockers On Beta blockers    3)Anemia HGb stable  4)Hypocalcemia Phosphorus at goal. After correcting for low albumin calcium is still low but stable  5)ID-Blood cultures positive for Coagulase negative staph and enterococcus PICC line now d/ced  6)Electrolytes Hypokalemic   Now better  Hyponatremic   7)Acid base Co2 not at goal But better    Plan:  Will continue current treatment plan I discuss pt renal issues again today with the daughter and son    Randa Pardy 07/12/2015, 12:19 PM

## 2015-07-12 NOTE — Progress Notes (Signed)
PROGRESS NOTE    [name]  ZOX:[id number] DOB: Jul 30, 1933 DOA: 07/04/2015 PCP: [name], MD  Outpatient Specialists:    Brief Narrative: 6 yof with a hx of dementia, HTN, CVA, chronic indwelling foley, PVD s/p bilateral BKA was recently started on gentamycin for a UTI with E. Coli / decubitus ulcer. It was noted that her renal function was abnormal and her Cr was elevated to 5. Her abx were stopped and she was started on IV hydration. Repeat labs showed worsening renal function, so she was brought to the ED. She does not eat or drink well and was very dehydrated. She is being treated with IV fluids, but has not had significant recovery of renal function as of yet. Nephrology is following.   Assessment & Plan:   Principal Problem:   Acute renal failure (HCC) Active Problems:   Essential hypertension   PERIPHERAL VASCULAR DISEASE   Dehydration   h/o Stroke   CAD (coronary artery disease)   DM (diabetes mellitus), type 2, uncontrolled, periph vascular complic (HCC)   Dementia   Metabolic acidosis   Lactic acidosis   Buttock wound   Pressure ulcer   Acute renal failure.   Likely due to prerenal azotemia in the setting of dehydration and ACE inhibitor/gentamycin.   Nephrology was consulted and continues to follow  Patient is continued on IVF infusion with IV lasix.   Continue to monitor urine output. Continue to monitor BMP. BUN and creatinine had continued to trend up.   Renal ultrasound without obstructive process.  Renal function improving  Gram positive cocci bacteremia, 1/2 cultures positive for coagulase negative staph and enterococcus.   This culture was drawn from PICC line.   On 07/08/2015, Dr. Kerry Hough had discussed with Dr. Anne Hahn with ID who recommended removing the PICC line, discontinuing antibiotics and then repeating blood cultures on 4/27  Repeat blood cultures neg thus far x 2  Hypokalemia  Replaced  Cont to replace as  tolerated  Hypomagnesemia  Improved  Will cont to replace to keep >2  Metabolic acidosis.   Due to renal failure.   Continue IVFs.   Lactic acidosis.   Anticipate resolution with tx renal failure.   Continue IVFs.   Holding metformin.  Dehydration.  Clinically improved with IV fluids  Essential HTN.   Patient is continued on metoprolol and norvasc.   Continue hydralazine prn.   Continue to hold ace-inhibitor per above  DM type 2 with peripheral vascular complications.   Continue SSI.   Stage III Decubitus ulcer on buttock, present on admisison.   Does not appear to be actively infected.   Continue wound care.  [facility] requested follow up wound culture. Will order, however findings will likely be non-diagnostic at best, pending  Dementia.   Stable.  Wide complex tachycardia, nonsustained  Noted recently. Thought initially to be secondary to hypokalemia/hypomagnesemai  Noted again overnight  Cont to normalize lytes   Already on metoprolol, however given HR of 60, would not increase dose  Will start on  bid amiodarone for now   DVT prophylaxis: Heparin subcutaneous Code Status: Full Family Communication: Patient in room, updated pt's daughter at bedside Disposition Plan: Uncertain at this time  Consultants:   Nephrology  Procedures:     Antimicrobials:   Levaquin 4/21 >>4/25  Vanc 4/21 >> 4/25  Azactam 4/21 >> 4/22    Subjective: Pt remains drowsy this AM. Family reports pt seems more arousable though  Objective: Filed Vitals:  07/11/15 1300 07/11/15 2006 07/11/15 2326 07/12/15 0522  BP: 132/52  131/46 106/77  Pulse: 52  98 60  Temp: 97.6 F (36.4 C)   96.8 F (36 C)  TempSrc:   Axillary Rectal  Resp: 20  20 20   Height:      Weight:      SpO2: 100% 96% 100% 95%    Intake/Output Summary (Last 24 hours) at 07/12/15 1602 Last data filed at 07/11/15 1909  Gross per 24 hour  Intake      0 ml   Output    750 ml  Net   -750 ml   Filed Weights   07/08/15 0616 07/09/15 0600 07/10/15 0500  Weight: 48.9 kg (107 lb 12.9 oz) 59.6 kg (131 lb 6.3 oz) 59.4 kg (130 lb 15.3 oz)    Examination:  General exam: Asleep, easily arousable, appears comfortable, in bed Respiratory system: Clear to auscultation. Respiratory effort normal. Cardiovascular system: S1 & S2 heard, RRR.  Gastrointestinal system: Abdomen is nondistended,  Normal bowel sounds heard. Central nervous system: Asleep, easily arousable. No focal neurological deficits. Extremities: Symmetric 5 x 5 power. Skin: No rashes, lesions Psychiatry: Difficult to fully assess, patient drowsy    Data Reviewed: I have personally reviewed following labs and imaging studies  CBC:  Recent Labs Lab 07/06/15 0515 07/09/15 0434  WBC 11.2* 11.7*  HGB 9.7* 10.0*  HCT 27.6* 28.3*  MCV 85.4 82.5  PLT 243 258   Basic Metabolic Panel:  Recent Labs Lab 07/06/15 0515 07/07/15 0455 07/08/15 0437 07/09/15 0434 07/10/15 0409 07/11/15 0424 07/11/15 2150 07/12/15 0636  NA 144 138 135 137 141 136 133* 133*  K 3.3* 3.5 4.3 3.4* 2.8* 2.8* 3.8 4.2  CL 116* 113* 111 107 103 98* 99* 100*  CO2 15* 14* 12* 14* 18* 18* 20* 20*  GLUCOSE 161* 197* 140* 89 103* 122* 112* 124*  BUN 88* 84* 85* 82* 85* 84* 79* 79*  CREATININE 5.63* 5.51* 5.71* 5.90* 5.89* 6.19* 5.82* 5.52*  CALCIUM 6.8* 6.5* 6.5* 6.3* 6.4* 6.3* 6.6* 6.8*  MG  --   --   --   --  0.9* 1.4* 1.9 1.7  PHOS 5.2* 4.2 4.5  --   --  5.3*  --  4.1   GFR: Estimated Creatinine Clearance: 7.4 mL/min (by C-G formula based on Cr of 5.52). Liver Function Tests:  Recent Labs Lab 07/06/15 0515 07/07/15 0455 07/08/15 0437 07/11/15 0424 07/12/15 0636  ALBUMIN 2.1* 1.9* 2.0* 1.9* 2.0*   No results for input(s): LIPASE, AMYLASE in the last 168 hours. No results for input(s): AMMONIA in the last 168 hours. Coagulation Profile: No results for input(s): INR, PROTIME in the last 168  hours. Cardiac Enzymes: No results for input(s): CKTOTAL, CKMB, CKMBINDEX, TROPONINI in the last 168 hours. BNP (last 3 results) No results for input(s): PROBNP in the last 8760 hours. HbA1C: No results for input(s): HGBA1C in the last 72 hours. CBG:  Recent Labs Lab 07/11/15 1155 07/11/15 1645 07/11/15 2103 07/12/15 0726 07/12/15 1113  GLUCAP 126* 123* 114* 106* 132*   Lipid Profile: No results for input(s): CHOL, HDL, LDLCALC, TRIG, CHOLHDL, LDLDIRECT in the last 72 hours. Thyroid Function Tests: No results for input(s): TSH, T4TOTAL, FREET4, T3FREE, THYROIDAB in the last 72 hours. Anemia Panel: No results for input(s): VITAMINB12, FOLATE, FERRITIN, TIBC, IRON, RETICCTPCT in the last 72 hours. Urine analysis:    Component Value Date/Time   COLORURINE YELLOW 07/04/2015 1945   APPEARANCEUR CLOUDY* 07/04/2015  1945   LABSPEC 1.025 07/04/2015 1945   PHURINE 5.5 07/04/2015 1945   GLUCOSEU 500* 07/04/2015 1945   HGBUR MODERATE* 07/04/2015 1945   BILIRUBINUR NEGATIVE 07/04/2015 1945   KETONESUR TRACE* 07/04/2015 1945   PROTEINUR 100* 07/04/2015 1945   UROBILINOGEN 0.2 01/11/2014 2247   NITRITE NEGATIVE 07/04/2015 1945   LEUKOCYTESUR MODERATE* 07/04/2015 1945   Sepsis Labs: @LABRCNTIP (procalcitonin:4,lacticidven:4)  ) Recent Results (from the past 240 hour(s))  Blood Culture (routine x 2)     Status: None   Collection Time: 07/04/15  6:56 PM  Result Value Ref Range Status   Specimen Description LEFT ANTECUBITAL  Final   Special Requests BOTTLES DRAWN AEROBIC ONLY 4CC ONLY  Final   Culture NO GROWTH 5 DAYS  Final   Report Status 07/09/2015 FINAL  Final  Urine culture     Status: None   Collection Time: 07/04/15  7:45 PM  Result Value Ref Range Status   Specimen Description URINE, RANDOM  Final   Special Requests NONE  Final   Culture MULTIPLE SPECIES PRESENT, SUGGEST RECOLLECTION  Final   Report Status 07/07/2015 FINAL  Final  Blood Culture (routine x 2)     Status:  Abnormal   Collection Time: 07/04/15  8:28 PM  Result Value Ref Range Status   Specimen Description BLOOD PICC LINE DRAWN BY RN  Final   Special Requests BOTTLES DRAWN AEROBIC AND ANAEROBIC [facility]6CC EACH  Final   Culture  Setup Time   Final    GRAM POSITIVE COCCI Gram Stain Report Called to,Read Back By and Verified With: BULILNS,L AT 1910 ON 07/05/2015 BY AGUNDIZ,E. GRAM POSITIVE COCCI Gram Stain Report Called to,Read Back By and Verified With: MCGIBBONEY,C. AT 1930 ON 07/05/2015 BY AGUNDIZ,E.    Culture (A)  Final    ENTEROCOCCUS SPECIES STAPHYLOCOCCUS SPECIES (COAGULASE NEGATIVE) THE SIGNIFICANCE OF ISOLATING THIS ORGANISM FROM A SINGLE SET OF BLOOD CULTURES WHEN MULTIPLE SETS ARE DRAWN IS UNCERTAIN. PLEASE NOTIFY THE MICROBIOLOGY DEPARTMENT WITHIN ONE WEEK IF SPECIATION AND SENSITIVITIES ARE REQUIRED. [facility]    Report Status 07/08/2015 FINAL  Final   Organism ID, Bacteria ENTEROCOCCUS SPECIES  Final      Susceptibility   Enterococcus species - MIC*    AMPICILLIN <=2 SENSITIVE Sensitive     VANCOMYCIN 1 SENSITIVE Sensitive     GENTAMICIN SYNERGY RESISTANT Resistant     * ENTEROCOCCUS SPECIES  MRSA PCR Screening     Status: None   Collection Time: 07/05/15 12:40 AM  Result Value Ref Range Status   MRSA by PCR NEGATIVE NEGATIVE Final    Comment:        The GeneXpert MRSA Assay (FDA approved for NASAL specimens only), is one component of a comprehensive MRSA colonization surveillance program. It is not intended to diagnose MRSA infection nor to guide or monitor treatment for MRSA infections.   Culture, blood (routine x 2)     Status: None (Preliminary result)   Collection Time: 07/10/15  2:26 PM  Result Value Ref Range Status   Specimen Description BLOOD LEFT ANTECUBITAL  Final   Special Requests BOTTLES DRAWN AEROBIC AND ANAEROBIC 6CC  Final   Culture NO GROWTH 2 DAYS  Final   Report Status PENDING  Incomplete  Culture, blood (routine x 2)      Status: None (Preliminary result)   Collection Time: 07/10/15  4:46 PM  Result Value Ref Range Status   Specimen Description BLOOD PICC LINE DRAWN BY RN  Final  Special Requests BOTTLES DRAWN AEROBIC AND ANAEROBIC 10CC EACH  Final   Culture NO GROWTH 2 DAYS  Final   Report Status PENDING  Incomplete  Wound culture     Status: None (Preliminary result)   Collection Time: 07/10/15  5:52 PM  Result Value Ref Range Status   Specimen Description DECUBITIS  Final   Special Requests NONE  Final   Gram Stain PENDING  Incomplete   Culture   Final    NO GROWTH 1 DAY Performed at [facility]    Report Status PENDING  Incomplete         Radiology Studies: No results found.      Scheduled Meds: . amiodarone  200 mg Oral BID  . amLODipine  5 mg Oral Daily  . aspirin EC  81 mg Oral Daily  . feeding supplement (NEPRO CARB STEADY)  237 mL Oral TID WC  . feeding supplement (NEPRO CARB STEADY)  237 mL Oral TID BM  . feeding supplement (PRO-STAT SUGAR FREE 64)  30 mL Oral BID  . furosemide  40 mg Intravenous BID  . heparin  5,000 Units Subcutaneous Q8H  . HYDROcodone-acetaminophen  1 tablet Oral Q12H  . insulin aspart  0-9 Units Subcutaneous TID WC  . magnesium sulfate 1 - 4 g bolus IVPB  2 g Intravenous Once  . metoprolol tartrate  25 mg Oral BID  . sodium chloride flush  3 mL Intravenous Q12H   Continuous Infusions: . sodium chloride 0.45 % with kcl 135 mL/hr at 07/12/15 1438     LOS: 8 days     CHIU, Scheryl Marten, MD [facility] Pager [phone]  If 7PM-7AM, please contact night-coverage [email or web address] Password [email or web address] 07/12/2015, 4:02 PM

## 2015-07-13 LAB — GLUCOSE, CAPILLARY
GLUCOSE-CAPILLARY: 102 mg/dL — AB (ref 65–99)
GLUCOSE-CAPILLARY: 65 mg/dL (ref 65–99)
GLUCOSE-CAPILLARY: 120 mg/dL — AB (ref 65–99)
Glucose-Capillary: 126 mg/dL — ABNORMAL HIGH (ref 65–99)
Glucose-Capillary: 78 mg/dL (ref 65–99)
Glucose-Capillary: 84 mg/dL (ref 65–99)

## 2015-07-13 LAB — BASIC METABOLIC PANEL
Anion gap: 9 (ref 5–15)
BUN: 72 mg/dL — AB (ref 6–20)
CHLORIDE: 101 mmol/L (ref 101–111)
CO2: 21 mmol/L — ABNORMAL LOW (ref 22–32)
CREATININE: 5.07 mg/dL — AB (ref 0.44–1.00)
Calcium: 7 mg/dL — ABNORMAL LOW (ref 8.9–10.3)
GFR calc Af Amer: 8 mL/min — ABNORMAL LOW (ref >60)
GFR calc non Af Amer: 7 mL/min — ABNORMAL LOW (ref >60)
Glucose, Bld: 134 mg/dL — ABNORMAL HIGH (ref 65–99)
Potassium: 4.8 mmol/L (ref 3.5–5.1)
SODIUM: 131 mmol/L — AB (ref 135–145)

## 2015-07-13 LAB — WOUND CULTURE
CULTURE: NO GROWTH
Gram Stain: NONE SEEN

## 2015-07-13 LAB — MAGNESIUM: MAGNESIUM: 2.2 mg/dL (ref 1.7–2.4)

## 2015-07-13 MED ORDER — SODIUM CHLORIDE 0.9 % IV SOLN
INTRAVENOUS | Status: DC
  Administered 2015-07-13 – 2015-07-15 (×4): via INTRAVENOUS

## 2015-07-13 NOTE — Progress Notes (Signed)
PROGRESS NOTE    OTELIA HETTINGER  WUJ:[id number] DOB: Sep 25, 1933 DOA: 07/04/2015 PCP: [name], MD  Outpatient Specialists:    Brief Narrative: 20 yof with a hx of dementia, HTN, CVA, chronic indwelling foley, PVD s/p bilateral BKA was recently started on gentamycin for a UTI with E. Coli / decubitus ulcer. It was noted that her renal function was abnormal and her Cr was elevated to 5. Her abx were stopped and she was started on IV hydration. Repeat labs showed worsening renal function, so she was brought to the ED. She does not eat or drink well and was very dehydrated. She is being treated with IV fluids, but has not had significant recovery of renal function as of yet. Nephrology is following.   Assessment & Plan:   Principal Problem:   Acute renal failure (HCC) Active Problems:   Essential hypertension   PERIPHERAL VASCULAR DISEASE   Dehydration   h/o Stroke   CAD (coronary artery disease)   DM (diabetes mellitus), type 2, uncontrolled, periph vascular complic (HCC)   Dementia   Metabolic acidosis   Lactic acidosis   Buttock wound   Pressure ulcer   Acute renal failure.   Likely due to prerenal azotemia in the setting of dehydration and ACE inhibitor/gentamycin.   Nephrology was consulted and continues to follow  Patient is continued on IVF infusion with IV lasix.   Continue to monitor urine output. Continue to monitor BMP. BUN and creatinine had continued to trend up.   Renal ultrasound without obstructive process.  Renal function is showing improvement  Gram positive cocci bacteremia, 1/2 cultures positive for coagulase negative staph and enterococcus.   This culture was drawn from PICC line.   On 07/08/2015, Dr. Kerry Hough had discussed with Dr. Anne Hahn with ID who recommended removing the PICC line, discontinuing antibiotics and then repeating blood cultures on 4/27  Repeat blood cultures neg thus far x 2  Hypokalemia  Replaced  Continue to  monitor  Hypomagnesemia  Replaced  Metabolic acidosis.   Due to renal failure.   Continue IVFs.   Lactic acidosis.   Anticipate resolution with tx renal failure.   Continue IVFs.   Holding metformin.  Dehydration.  Clinically improved with IV fluids  Essential HTN.   Patient is continued on metoprolol and norvasc.   Continue hydralazine prn.   Continue to hold ace-inhibitor per above  DM type 2 with peripheral vascular complications.   Continue SSI.   Stage III Decubitus ulcer on buttock, present on admisison.   Does not appear to be actively infected.   Continue wound care.  [facility] requested follow up wound culture. Will order, however findings will likely be non-diagnostic at best, pending  Dementia.   Stable.  Wide complex tachycardia, nonsustained  Possibly secondary to recent hypokalemia, hypomagnesemia, metabolic acidosis related to renal sufficiency  Electrolytes have been normalized  Amiodarone was added to metoprolol on 4/29  This a.m., patient noted to be bradycardic with heart rate down to the mid 30s, asymptomatic  Have held amiodarone as well as metoprolol. Discussed case with cardiology who agrees. Also recommendations for 2-D echocardiogram has been ordered.  Heart rate is improving.   DVT prophylaxis: Heparin subcutaneous Code Status: Full Family Communication: Patient in room, updated pt's daughter at bedside Disposition Plan: Uncertain at this time  Consultants:   Nephrology  Procedures:     Antimicrobials:   Levaquin 4/21 >>4/25  Vanc 4/21 >> 4/25  Azactam 4/21 >> 4/22  Subjective: More awake this a.m., patient without complaints  Objective: Filed Vitals:   07/12/15 1734 07/12/15 2040 07/12/15 2110 07/13/15 0546  BP: 152/60 155/62  127/76  Pulse: 55 57  58  Temp: 97.5 F (36.4 C) 97.2 F (36.2 C)  97.5 F (36.4 C)  TempSrc: Axillary Axillary  Axillary  Resp: 20 20  20   Height:       Weight:      SpO2: 97% 95% 97% 92%    Intake/Output Summary (Last 24 hours) at 07/13/15 1606 Last data filed at 07/13/15 1200  Gross per 24 hour  Intake     10 ml  Output   2000 ml  Net  -1990 ml   Filed Weights   07/08/15 0616 07/09/15 0600 07/10/15 0500  Weight: 48.9 kg (107 lb 12.9 oz) 59.6 kg (131 lb 6.3 oz) 59.4 kg (130 lb 15.3 oz)    Examination:  General exam:Awake, appears comfortable, in bed Respiratory system: Clear to auscultation. Respiratory effort normal. Cardiovascular system: S1 & S2 heard, RRR.  Gastrointestinal system: Abdomen is nondistended,  Normal bowel sounds heard. Central nervous system: Asleep, easily arousable. No focal neurological deficits. Extremities: Symmetric 5 x 5 power. Skin: No rashes, lesions Psychiatry: Difficult to fully assess, patient drowsy    Data Reviewed: I have personally reviewed following labs and imaging studies  CBC:  Recent Labs Lab 07/09/15 0434  WBC 11.7*  HGB 10.0*  HCT 28.3*  MCV 82.5  PLT 258   Basic Metabolic Panel:  Recent Labs Lab 07/07/15 0455 07/08/15 0437  07/10/15 0409 07/11/15 0424 07/11/15 2150 07/12/15 0636 07/13/15 0625  NA 138 135  < > 141 136 133* 133* 131*  K 3.5 4.3  < > 2.8* 2.8* 3.8 4.2 4.8  CL 113* 111  < > 103 98* 99* 100* 101  CO2 14* 12*  < > 18* 18* 20* 20* 21*  GLUCOSE 197* 140*  < > 103* 122* 112* 124* 134*  BUN 84* 85*  < > 85* 84* 79* 79* 72*  CREATININE 5.51* 5.71*  < > 5.89* 6.19* 5.82* 5.52* 5.07*  CALCIUM 6.5* 6.5*  < > 6.4* 6.3* 6.6* 6.8* 7.0*  MG  --   --   --  0.9* 1.4* 1.9 1.7 2.2  PHOS 4.2 4.5  --   --  5.3*  --  4.1  --   < > = values in this interval not displayed. GFR: Estimated Creatinine Clearance: 8.1 mL/min (by C-G formula based on Cr of 5.07). Liver Function Tests:  Recent Labs Lab 07/07/15 0455 07/08/15 0437 07/11/15 0424 07/12/15 0636  ALBUMIN 1.9* 2.0* 1.9* 2.0*   No results for input(s): LIPASE, AMYLASE in the last 168 hours. No results  for input(s): AMMONIA in the last 168 hours. Coagulation Profile: No results for input(s): INR, PROTIME in the last 168 hours. Cardiac Enzymes: No results for input(s): CKTOTAL, CKMB, CKMBINDEX, TROPONINI in the last 168 hours. BNP (last 3 results) No results for input(s): PROBNP in the last 8760 hours. HbA1C: No results for input(s): HGBA1C in the last 72 hours. CBG:  Recent Labs Lab 07/12/15 2044 07/13/15 0754 07/13/15 0945 07/13/15 1142 07/13/15 1240  GLUCAP 157* 126* 102* 65 78   Lipid Profile: No results for input(s): CHOL, HDL, LDLCALC, TRIG, CHOLHDL, LDLDIRECT in the last 72 hours. Thyroid Function Tests: No results for input(s): TSH, T4TOTAL, FREET4, T3FREE, THYROIDAB in the last 72 hours. Anemia Panel: No results for input(s): VITAMINB12, FOLATE, FERRITIN, TIBC, IRON, RETICCTPCT  in the last 72 hours. Urine analysis:    Component Value Date/Time   COLORURINE YELLOW 07/04/2015 1945   APPEARANCEUR CLOUDY* 07/04/2015 1945   LABSPEC 1.025 07/04/2015 1945   PHURINE 5.5 07/04/2015 1945   GLUCOSEU 500* 07/04/2015 1945   HGBUR MODERATE* 07/04/2015 1945   BILIRUBINUR NEGATIVE 07/04/2015 1945   KETONESUR TRACE* 07/04/2015 1945   PROTEINUR 100* 07/04/2015 1945   UROBILINOGEN 0.2 01/11/2014 2247   NITRITE NEGATIVE 07/04/2015 1945   LEUKOCYTESUR MODERATE* 07/04/2015 1945   Sepsis Labs: @LABRCNTIP (procalcitonin:4,lacticidven:4)  ) Recent Results (from the past 240 hour(s))  Blood Culture (routine x 2)     Status: None   Collection Time: 07/04/15  6:56 PM  Result Value Ref Range Status   Specimen Description LEFT ANTECUBITAL  Final   Special Requests BOTTLES DRAWN AEROBIC ONLY 4CC ONLY  Final   Culture NO GROWTH 5 DAYS  Final   Report Status 07/09/2015 FINAL  Final  Urine culture     Status: None   Collection Time: 07/04/15  7:45 PM  Result Value Ref Range Status   Specimen Description URINE, RANDOM  Final   Special Requests NONE  Final   Culture MULTIPLE SPECIES  PRESENT, SUGGEST RECOLLECTION  Final   Report Status 07/07/2015 FINAL  Final  Blood Culture (routine x 2)     Status: Abnormal   Collection Time: 07/04/15  8:28 PM  Result Value Ref Range Status   Specimen Description BLOOD PICC LINE DRAWN BY RN  Final   Special Requests BOTTLES DRAWN AEROBIC AND ANAEROBIC [facility] EACH  Final   Culture  Setup Time   Final    GRAM POSITIVE COCCI Gram Stain Report Called to,Read Back By and Verified With: BULILNS,L AT 1910 ON 07/05/2015 BY AGUNDIZ,E. GRAM POSITIVE COCCI Gram Stain Report Called to,Read Back By and Verified With: MCGIBBONEY,C. AT 1930 ON 07/05/2015 BY AGUNDIZ,E.    Culture (A)  Final    ENTEROCOCCUS SPECIES STAPHYLOCOCCUS SPECIES (COAGULASE NEGATIVE) THE SIGNIFICANCE OF ISOLATING THIS ORGANISM FROM A SINGLE SET OF BLOOD CULTURES WHEN MULTIPLE SETS ARE DRAWN IS UNCERTAIN. PLEASE NOTIFY THE MICROBIOLOGY DEPARTMENT WITHIN ONE WEEK IF SPECIATION AND SENSITIVITIES ARE REQUIRED. Performed at [facility]    Report Status 07/08/2015 FINAL  Final   Organism ID, Bacteria ENTEROCOCCUS SPECIES  Final      Susceptibility   Enterococcus species - MIC*    AMPICILLIN <=2 SENSITIVE Sensitive     VANCOMYCIN 1 SENSITIVE Sensitive     GENTAMICIN SYNERGY RESISTANT Resistant     * ENTEROCOCCUS SPECIES  MRSA PCR Screening     Status: None   Collection Time: 07/05/15 12:40 AM  Result Value Ref Range Status   MRSA by PCR NEGATIVE NEGATIVE Final    Comment:        The GeneXpert MRSA Assay (FDA approved for NASAL specimens only), is one component of a comprehensive MRSA colonization surveillance program. It is not intended to diagnose MRSA infection nor to guide or monitor treatment for MRSA infections.   Culture, blood (routine x 2)     Status: None (Preliminary result)   Collection Time: 07/10/15  2:26 PM  Result Value Ref Range Status   Specimen Description BLOOD LEFT ANTECUBITAL  Final   Special Requests BOTTLES DRAWN AEROBIC AND ANAEROBIC 6CC   Final   Culture NO GROWTH 3 DAYS  Final   Report Status PENDING  Incomplete  Culture, blood (routine x 2)     Status: None (Preliminary result)   Collection  Time: 07/10/15  4:46 PM  Result Value Ref Range Status   Specimen Description BLOOD PICC LINE DRAWN BY RN  Final   Special Requests BOTTLES DRAWN AEROBIC AND ANAEROBIC 10CC EACH  Final   Culture NO GROWTH 3 DAYS  Final   Report Status PENDING  Incomplete  Wound culture     Status: None (Preliminary result)   Collection Time: 07/10/15  5:52 PM  Result Value Ref Range Status   Specimen Description DECUBITIS  Final   Special Requests NONE  Final   Gram Stain PENDING  Incomplete   Culture   Final    NO GROWTH 2 DAYS Performed at [facility]    Report Status PENDING  Incomplete         Radiology Studies: No results found.      Scheduled Meds: . amLODipine  5 mg Oral Daily  . aspirin EC  81 mg Oral Daily  . feeding supplement (NEPRO CARB STEADY)  237 mL Oral TID WC  . feeding supplement (NEPRO CARB STEADY)  237 mL Oral TID BM  . feeding supplement (PRO-STAT SUGAR FREE 64)  30 mL Oral BID  . furosemide  40 mg Intravenous BID  . heparin  5,000 Units Subcutaneous Q8H  . HYDROcodone-acetaminophen  1 tablet Oral Q12H  . insulin aspart  0-9 Units Subcutaneous TID WC  . sodium chloride flush  3 mL Intravenous Q12H   Continuous Infusions: . sodium chloride 135 mL/hr at 07/13/15 1432     LOS: 9 days     Annalea Alguire, Scheryl Marten, MD [facility] Pager [phone]  If 7PM-7AM, please contact night-coverage [email or web address] Password [email or web address] 07/13/2015, 4:06 PM

## 2015-07-13 NOTE — Progress Notes (Signed)
Patient is sinus brady in the 30s and 40s. Patient also had a 5 beat run of Vtach. Dr Red Christianshui notified.

## 2015-07-13 NOTE — Progress Notes (Signed)
Dr Kristian CoveyBefekadu called with new order to d/c 1/2 NS 40k potassium. New order for Normal saline at 135cc/hr

## 2015-07-13 NOTE — Progress Notes (Signed)
Patient's CBG 65, gave patient some juice. Rechecked 78. Dr. Red Christianshui notified.

## 2015-07-13 NOTE — Progress Notes (Signed)
Subjective: Patient nonverbal, but indicates no complaints, stable  Objective: Vital signs in last 24 hours: Temp:  [97.2 F (36.2 C)-97.5 F (36.4 C)] 97.5 F (36.4 C) (04/30 0546) Pulse Rate:  [55-58] 58 (04/30 0546) Resp:  [20] 20 (04/30 0546) BP: (127-155)/(60-76) 127/76 mmHg (04/30 0546) SpO2:  [92 %-97 %] 92 % (04/30 0546) Weight change:   Intake/Output from previous day: 04/29 0701 - 04/30 0700 In: 60 [P.O.:60] Out: 2000 [Urine:2000] Intake/Output this shift:   EXAM: General appearance:  Comfortable, in no apparent distress Resp:  CTA without rales, rhonchi, or wheezes Cardio:  RRR with Gr II/VI systolic murmur, no rub GI:   + BS, soft and nontender Extremities:  Bilateral BKA, trace edema  Lab Results: No results for input(s): WBC, HGB, HCT, PLT in the last 72 hours. BMET:  Recent Labs  07/11/15 0424  07/12/15 0636 07/13/15 0625  NA 136  < > 133* 131*  K 2.8*  < > 4.2 4.8  CL 98*  < > 100* 101  CO2 18*  < > 20* 21*  GLUCOSE 122*  < > 124* 134*  BUN 84*  < > 79* 72*  CREATININE 6.19*  < > 5.52* 5.07*  CALCIUM 6.3*  < > 6.8* 7.0*  ALBUMIN 1.9*  --  2.0*  --   < > = values in this interval not displayed. No results for input(s): PTH in the last 72 hours. Iron Studies: No results for input(s): IRON, TIBC, TRANSFERRIN, FERRITIN in the last 72 hours.   Studies/Results: No results found.   Assessment/Plan: 1. Acute kidney injury superimposed on chronic - believed to be secondary to gentamicin/ACE/vancomycin. Her renal function is improving with Cr down to 5.07, but because of her dementia very difficult to know whether patient has uremic signs and symptoms. Patient has 2000 ml of urine output in 24 hrs on Lasix 40 mg IV bid, net I/O -4886 ml since admission. 2. Hypokalemia - K improved to 4.8 on 1/2 NS with KCl 40 mEq. 3. Metabolic acidosis - CO2 improving on sodium bicarbonate. 4. Anemia - Hgb is stable.. 5. Diabetes - on insulin, glucose stable. 6. Urinary  tract infection patient - s/p antibiotics.  7. History of CVA 8. Metabolic bone disease - Ca 7 (8.6 corrected), P 4.1, stable. 9. Bacteremia - 1/2 blood cultures + for coagulase-negative staph & enterococcus; PICC line removed 4/25, repeat cultures 4/27 negative.  Plan:  Continue with present mgt           Renal panel & CBC tomorrow AM           Reviewed with Dr. Kristian CoveyBefekadu  10.     LOS: 9 days   LYLES,CHARLES 07/13/2015,7:59 AM

## 2015-07-14 LAB — RENAL FUNCTION PANEL
Albumin: 2 g/dL — ABNORMAL LOW (ref 3.5–5.0)
Anion gap: 10 (ref 5–15)
BUN: 72 mg/dL — AB (ref 6–20)
CHLORIDE: 105 mmol/L (ref 101–111)
CO2: 20 mmol/L — AB (ref 22–32)
CREATININE: 4.91 mg/dL — AB (ref 0.44–1.00)
Calcium: 7.2 mg/dL — ABNORMAL LOW (ref 8.9–10.3)
GFR calc Af Amer: 9 mL/min — ABNORMAL LOW (ref >60)
GFR calc non Af Amer: 8 mL/min — ABNORMAL LOW (ref >60)
Glucose, Bld: 116 mg/dL — ABNORMAL HIGH (ref 65–99)
Phosphorus: 3.4 mg/dL (ref 2.5–4.6)
Potassium: 4.2 mmol/L (ref 3.5–5.1)
Sodium: 135 mmol/L (ref 135–145)

## 2015-07-14 LAB — CBC
HEMATOCRIT: 26.6 % — AB (ref 36.0–46.0)
Hemoglobin: 9.4 g/dL — ABNORMAL LOW (ref 12.0–15.0)
MCH: 29.4 pg (ref 26.0–34.0)
MCHC: 35.3 g/dL (ref 30.0–36.0)
MCV: 83.1 fL (ref 78.0–100.0)
PLATELETS: 199 10*3/uL (ref 150–400)
RBC: 3.2 MIL/uL — AB (ref 3.87–5.11)
RDW: 13.8 % (ref 11.5–15.5)
WBC: 14.5 10*3/uL — AB (ref 4.0–10.5)

## 2015-07-14 LAB — GLUCOSE, CAPILLARY
GLUCOSE-CAPILLARY: 109 mg/dL — AB (ref 65–99)
GLUCOSE-CAPILLARY: 107 mg/dL — AB (ref 65–99)
Glucose-Capillary: 105 mg/dL — ABNORMAL HIGH (ref 65–99)

## 2015-07-14 MED ORDER — METOPROLOL TARTRATE 25 MG PO TABS
12.5000 mg | ORAL_TABLET | Freq: Two times a day (BID) | ORAL | Status: DC
  Administered 2015-07-14 – 2015-07-15 (×2): 12.5 mg via ORAL
  Filled 2015-07-14 (×2): qty 1

## 2015-07-14 NOTE — Progress Notes (Signed)
Subjective: Interval History: Patient offers no complaints.  Objective: Vital signs in last 24 hours: Temp:  [97.7 F (36.5 C)-97.8 F (36.6 C)] 97.8 F (36.6 C) (05/01 0620) Pulse Rate:  [69-70] 70 (05/01 0620) Resp:  [20] 20 (05/01 0620) BP: (131-137)/(53-64) 137/53 mmHg (05/01 0620) SpO2:  [91 %-95 %] 95 % (05/01 0620) Weight change:   Intake/Output from previous day: 04/30 0701 - 05/01 0700 In: 10 [P.O.:10] Out: 500 [Urine:500] Intake/Output this shift:   Generally patient is sommolent but arousable and in no apparent distress Chest is clear to auscultation Heart examination regular rate and rhythm no murmur or S3 Extremities: She is status post bilateral BKA and no edema   Lab Results:  Recent Labs  07/14/15 0443  WBC 14.5*  HGB 9.4*  HCT 26.6*  PLT 199   BMET:   Recent Labs  07/13/15 0625 07/14/15 0443  NA 131* 135  K 4.8 4.2  CL 101 105  CO2 21* 20*  GLUCOSE 134* 116*  BUN 72* 72*  CREATININE 5.07* 4.91*  CALCIUM 7.0* 7.2*   No results for input(s): PTH in the last 72 hours. Iron Studies: No results for input(s): IRON, TIBC, TRANSFERRIN, FERRITIN in the last 72 hours.  Studies/Results: No results found.  I have reviewed the patient's current medications.  Assessment/Plan: Problem #1 acute kidney injury superimposed on chronic. Thought to be secondary to gentamicin/ACE/vancomycin. Her renal function very slowly seems to be improving. Patient remains nonoliguric. Problem #2 hypokalemia: Potassium supplement was discontinued. Her potassium remains normal. Problem #3. Low CO2: Possibly metabolic. He remains stable  Problem #4 anemia: Her hemoglobin is below our target goal. Her hemoglobin however remains stable. Problem #5 history of diabetes Problem #6 urinary tract infection patient on antibiotics: Patient is a febrile and her white blood cell count however seems to be increasing. Problem #7 history of CVA Problem #8. Metabolic bone disease :  Her calcium and phosphorus is range. Problem #9 hypertension: Her blood pressure is reasonably controlled Plan: 1] will continue his normal saline at present rate.  2] check her renal panel in the morning.   LOS: 10 days   [name] S 07/14/2015,10:15 AM

## 2015-07-14 NOTE — Progress Notes (Signed)
Dressing changes completed

## 2015-07-14 NOTE — Progress Notes (Signed)
PROGRESS NOTE    Sara GuarneriHelen W [name]  ZOX:[id number] DOB: 1933/05/02 DOA: 07/04/2015 PCP: Dwana MelenaZack Hall, MD  Outpatient Specialists:    Brief Narrative: [age] yof with a hx of dementia, HTN, CVA, chronic indwelling foley, PVD s/p bilateral BKA was recently started on gentamycin for a UTI with E. Coli / decubitus ulcer. It was noted that her renal function was abnormal and her Cr was elevated to 5. Her abx were stopped and she was started on IV hydration. Repeat labs showed worsening renal function, so she was brought to the ED. She does not eat or drink well and was very dehydrated. She is being treated with IV fluids, but has not had significant recovery of renal function as of yet. Nephrology is following.   Assessment & Plan:   Principal Problem:   Acute renal failure (HCC) Active Problems:   Essential hypertension   PERIPHERAL VASCULAR DISEASE   Dehydration   h/o Stroke   CAD (coronary artery disease)   DM (diabetes mellitus), type 2, uncontrolled, periph vascular complic (HCC)   Dementia   Metabolic acidosis   Lactic acidosis   Buttock wound   Pressure ulcer   Acute renal failure.   Likely due to prerenal azotemia in the setting of dehydration and ACE inhibitor/gentamycin.   Nephrology was consulted and continues to follow  Patient is continued on IVF infusion with IV lasix.   Continue to monitor urine output. Continue to monitor BMP. BUN and creatinine had continued to trend up.   Renal ultrasound without obstructive process.  Renal function continuing to show improvement  Gram positive cocci bacteremia, 1/2 cultures positive for coagulase negative staph and enterococcus.   This culture was drawn from PICC line.   On 07/08/2015, Dr. Kerry HoughMemon had discussed with Dr. Anne HahnSynder with ID who recommended removing the PICC line, discontinuing antibiotics and then repeating blood cultures on 4/27  Repeat blood cultures neg thus far x 2  Hypokalemia  Replaced  Continue to  monitor  Hypomagnesemia  Replaced  Metabolic acidosis.   Due to renal failure.   Continue IVFs.   Lactic acidosis.   Anticipate resolution with tx renal failure.   Continue IVFs.   Holding metformin.  Dehydration.  Clinically improved with IV fluid hydration  Essential HTN.   Patient is continued on norvasc.   Continued hydralazine prn.   Continue to hold ace-inhibitor per above  DM type 2 with peripheral vascular complications.   Continue SSI.   Stage III Decubitus ulcer on buttock, present on admisison.   Does not appear to be actively infected.   Continue wound care.  Skilled Nursing Agency requested follow up wound culture. Will order, however findings will likely be non-diagnostic at best, pending  Dementia.   Stable.  Wide complex tachycardia, nonsustained  Possibly secondary to recent hypokalemia, hypomagnesemia, metabolic acidosis related to renal sufficiency  Electrolytes have been normalized  Amiodarone was added to metoprolol on 4/29  On 4/30, patient noted to be bradycardic with heart rate down to the mid 30s, asymptomatic. Held amiodarone as well as metoprolol. Discussed case with cardiology who agreed with plan. Also recommended 2-D echocardiogram  Heart rate is improving.  May resume low dose metoprolol if HR and BP tolerates within the next 24hrs   DVT prophylaxis: Heparin subcutaneous Code Status: Full Family Communication: Patient in room, updated pt's daughter at bedside Disposition Plan: Uncertain at this time  Consultants:   Nephrology  Procedures:     Antimicrobials:   Levaquin 4/21 >>4/25  Vanc 4/21 >> 4/25  Azactam 4/21 >> 4/22    Subjective: Patient awake this AM, not following commands however  Objective: Filed Vitals:   07/13/15 2017 07/13/15 2219 07/14/15 0620 07/14/15 1323  BP:  131/64 137/53 135/68  Pulse:  69 70 74  Temp:  97.7 F (36.5 C) 97.8 F (36.6 C) 98.2 F (36.8 C)  TempSrc:   Axillary Axillary Oral  Resp:  20 20 18   Height:      Weight:      SpO2: 94% 91% 95% 97%    Intake/Output Summary (Last 24 hours) at 07/14/15 1428 Last data filed at 07/14/15 1610  Gross per 24 hour  Intake      0 ml  Output    500 ml  Net   -500 ml   Filed Weights   07/08/15 0616 07/09/15 0600 07/10/15 0500  Weight: 48.9 kg (107 lb 12.9 oz) 59.6 kg (131 lb 6.3 oz) 59.4 kg (130 lb 15.3 oz)    Examination:  General exam:Awake, appears comfortable, in bed Respiratory system: Clear to auscultation. Respiratory effort normal. Cardiovascular system: S1 & S2 heard, RRR.  Gastrointestinal system: Abdomen is nondistended,  Normal bowel sounds heard. Central nervous system: Asleep, easily arousable. No focal neurological deficits. Extremities: Symmetric 5 x 5 power. Skin: No rashes, lesions Psychiatry: Difficult to fully assess, patient drowsy, but awake    Data Reviewed: I have personally reviewed following labs and imaging studies  CBC:  Recent Labs Lab 07/09/15 0434 07/14/15 0443  WBC 11.7* 14.5*  HGB 10.0* 9.4*  HCT 28.3* 26.6*  MCV 82.5 83.1  PLT 258 199   Basic Metabolic Panel:  Recent Labs Lab 07/08/15 0437  07/10/15 0409 07/11/15 0424 07/11/15 2150 07/12/15 0636 07/13/15 0625 07/14/15 0443  NA 135  < > 141 136 133* 133* 131* 135  K 4.3  < > 2.8* 2.8* 3.8 4.2 4.8 4.2  CL 111  < > 103 98* 99* 100* 101 105  CO2 12*  < > 18* 18* 20* 20* 21* 20*  GLUCOSE 140*  < > 103* 122* 112* 124* 134* 116*  BUN 85*  < > 85* 84* 79* 79* 72* 72*  CREATININE 5.71*  < > 5.89* 6.19* 5.82* 5.52* 5.07* 4.91*  CALCIUM 6.5*  < > 6.4* 6.3* 6.6* 6.8* 7.0* 7.2*  MG  --   --  0.9* 1.4* 1.9 1.7 2.2  --   PHOS 4.5  --   --  5.3*  --  4.1  --  3.4  < > = values in this interval not displayed. GFR: Estimated Creatinine Clearance: 8.4 mL/min (by C-G formula based on Cr of 4.91). Liver Function Tests:  Recent Labs Lab 07/08/15 0437 07/11/15 0424 07/12/15 0636 07/14/15 0443   ALBUMIN 2.0* 1.9* 2.0* 2.0*   No results for input(s): LIPASE, AMYLASE in the last 168 hours. No results for input(s): AMMONIA in the last 168 hours. Coagulation Profile: No results for input(s): INR, PROTIME in the last 168 hours. Cardiac Enzymes: No results for input(s): CKTOTAL, CKMB, CKMBINDEX, TROPONINI in the last 168 hours. BNP (last 3 results) No results for input(s): PROBNP in the last 8760 hours. HbA1C: No results for input(s): HGBA1C in the last 72 hours. CBG:  Recent Labs Lab 07/13/15 1240 07/13/15 1615 07/13/15 2050 07/14/15 0731 07/14/15 1128  GLUCAP 78 84 120* 105* 109*   Lipid Profile: No results for input(s): CHOL, HDL, LDLCALC, TRIG, CHOLHDL, LDLDIRECT in the last 72 hours. Thyroid Function Tests: No  results for input(s): TSH, T4TOTAL, FREET4, T3FREE, THYROIDAB in the last 72 hours. Anemia Panel: No results for input(s): VITAMINB12, FOLATE, FERRITIN, TIBC, IRON, RETICCTPCT in the last 72 hours. Urine analysis:    Component Value Date/Time   COLORURINE YELLOW 07/04/2015 1945   APPEARANCEUR CLOUDY* 07/04/2015 1945   LABSPEC 1.025 07/04/2015 1945   PHURINE 5.5 07/04/2015 1945   GLUCOSEU 500* 07/04/2015 1945   HGBUR MODERATE* 07/04/2015 1945   BILIRUBINUR NEGATIVE 07/04/2015 1945   KETONESUR TRACE* 07/04/2015 1945   PROTEINUR 100* 07/04/2015 1945   UROBILINOGEN 0.2 01/11/2014 2247   NITRITE NEGATIVE 07/04/2015 1945   LEUKOCYTESUR MODERATE* 07/04/2015 1945   Sepsis Labs: (procalcitonin:4,lacticidven:4)  ) Recent Results (from the past 240 hour(s))  Blood Culture (routine x 2)     Status: None   Collection Time: 07/04/15  6:56 PM  Result Value Ref Range Status   Specimen Description LEFT ANTECUBITAL  Final   Special Requests BOTTLES DRAWN AEROBIC ONLY 4CC ONLY  Final   Culture NO GROWTH 5 DAYS  Final   Report Status 07/09/2015 FINAL  Final  Urine culture     Status: None   Collection Time: 07/04/15  7:45 PM  Result Value Ref Range  Status   Specimen Description URINE, RANDOM  Final   Special Requests NONE  Final   Culture MULTIPLE SPECIES PRESENT, SUGGEST RECOLLECTION  Final   Report Status 07/07/2015 FINAL  Final  Blood Culture (routine x 2)     Status: Abnormal   Collection Time: 07/04/15  8:28 PM  Result Value Ref Range Status   Specimen Description BLOOD PICC LINE DRAWN BY RN  Final   Special Requests BOTTLES DRAWN AEROBIC AND ANAEROBIC [facility] EACH  Final   Culture  Setup Time   Final    GRAM POSITIVE COCCI Gram Stain Report Called to,Read Back By and Verified With: BULILNS,L AT 1910 ON 07/05/2015 BY AGUNDIZ,E. GRAM POSITIVE COCCI Gram Stain Report Called to,Read Back By and Verified With: MCGIBBONEY,C. AT 1930 ON 07/05/2015 BY AGUNDIZ,E.    Culture (A)  Final    ENTEROCOCCUS SPECIES STAPHYLOCOCCUS SPECIES (COAGULASE NEGATIVE) THE SIGNIFICANCE OF ISOLATING THIS ORGANISM FROM A SINGLE SET OF BLOOD CULTURES WHEN MULTIPLE SETS ARE DRAWN IS UNCERTAIN. PLEASE NOTIFY THE MICROBIOLOGY DEPARTMENT WITHIN ONE WEEK IF SPECIATION AND SENSITIVITIES ARE REQUIRED. Performed at [facility]    Report Status 07/08/2015 FINAL  Final   Organism ID, Bacteria ENTEROCOCCUS SPECIES  Final      Susceptibility   Enterococcus species - MIC*    AMPICILLIN <=2 SENSITIVE Sensitive     VANCOMYCIN 1 SENSITIVE Sensitive     GENTAMICIN SYNERGY RESISTANT Resistant     * ENTEROCOCCUS SPECIES  MRSA PCR Screening     Status: None   Collection Time: 07/05/15 12:40 AM  Result Value Ref Range Status   MRSA by PCR NEGATIVE NEGATIVE Final    Comment:        The GeneXpert MRSA Assay (FDA approved for NASAL specimens only), is one component of a comprehensive MRSA colonization surveillance program. It is not intended to diagnose MRSA infection nor to guide or monitor treatment for MRSA infections.   Culture, blood (routine x 2)     Status: None (Preliminary result)   Collection Time: 07/10/15  2:26 PM  Result Value Ref Range Status    Specimen Description BLOOD LEFT ANTECUBITAL  Final   Special Requests BOTTLES DRAWN AEROBIC AND ANAEROBIC 6CC  Final   Culture NO GROWTH 4 DAYS  Final   Report Status PENDING  Incomplete  Culture, blood (routine x 2)     Status: None (Preliminary result)   Collection Time: 07/10/15  4:46 PM  Result Value Ref Range Status   Specimen Description BLOOD PICC LINE DRAWN BY RN  Final   Special Requests BOTTLES DRAWN AEROBIC AND ANAEROBIC 10CC EACH  Final   Culture NO GROWTH 4 DAYS  Final   Report Status PENDING  Incomplete  Wound culture     Status: None   Collection Time: 07/10/15  5:52 PM  Result Value Ref Range Status   Specimen Description DECUBITIS  Final   Special Requests NONE  Final   Gram Stain   Final    NO WBC SEEN NO SQUAMOUS EPITHELIAL CELLS SEEN NO ORGANISMS SEEN Performed at [facility]    Culture   Final    NO GROWTH 2 DAYS Performed at [facility]    Report Status 07/13/2015 FINAL  Final         Radiology Studies: No results found.      Scheduled Meds: . amLODipine  5 mg Oral Daily  . aspirin EC  81 mg Oral Daily  . feeding supplement (NEPRO CARB STEADY)  237 mL Oral TID WC  . feeding supplement (NEPRO CARB STEADY)  237 mL Oral TID BM  . feeding supplement (PRO-STAT SUGAR FREE 64)  30 mL Oral BID  . furosemide  40 mg Intravenous BID  . heparin  5,000 Units Subcutaneous Q8H  . HYDROcodone-acetaminophen  1 tablet Oral Q12H  . insulin aspart  0-9 Units Subcutaneous TID WC  . sodium chloride flush  3 mL Intravenous Q12H   Continuous Infusions: . sodium chloride 135 mL/hr at 07/14/15 1114     LOS: 10 days     Oluwanifemi Petitti, Scheryl Marten, MD [facility] Pager [phone]  If 7PM-7AM, please contact night-coverage [email or web address] Password [email or web address] 07/14/2015, 2:28 PM

## 2015-07-15 ENCOUNTER — Inpatient Hospital Stay (HOSPITAL_COMMUNITY): Payer: Medicare Other

## 2015-07-15 DIAGNOSIS — R001 Bradycardia, unspecified: Secondary | ICD-10-CM

## 2015-07-15 DIAGNOSIS — I472 Ventricular tachycardia: Secondary | ICD-10-CM

## 2015-07-15 LAB — GLUCOSE, CAPILLARY
GLUCOSE-CAPILLARY: 102 mg/dL — AB (ref 65–99)
GLUCOSE-CAPILLARY: 104 mg/dL — AB (ref 65–99)
Glucose-Capillary: 117 mg/dL — ABNORMAL HIGH (ref 65–99)
Glucose-Capillary: 109 mg/dL — ABNORMAL HIGH (ref 65–99)
Glucose-Capillary: 128 mg/dL — ABNORMAL HIGH (ref 65–99)

## 2015-07-15 LAB — RENAL FUNCTION PANEL
ANION GAP: 10 (ref 5–15)
Albumin: 2.1 g/dL — ABNORMAL LOW (ref 3.5–5.0)
BUN: 64 mg/dL — ABNORMAL HIGH (ref 6–20)
CHLORIDE: 110 mmol/L (ref 101–111)
CO2: 19 mmol/L — AB (ref 22–32)
Calcium: 7.4 mg/dL — ABNORMAL LOW (ref 8.9–10.3)
Creatinine, Ser: 4.69 mg/dL — ABNORMAL HIGH (ref 0.44–1.00)
GFR, EST AFRICAN AMERICAN: 9 mL/min — AB (ref >60)
GFR, EST NON AFRICAN AMERICAN: 8 mL/min — AB (ref >60)
Glucose, Bld: 116 mg/dL — ABNORMAL HIGH (ref 65–99)
POTASSIUM: 3.5 mmol/L (ref 3.5–5.1)
Phosphorus: 4 mg/dL (ref 2.5–4.6)
Sodium: 139 mmol/L (ref 135–145)

## 2015-07-15 LAB — CBC
HEMATOCRIT: 26.7 % — AB (ref 36.0–46.0)
Hemoglobin: 9.3 g/dL — ABNORMAL LOW (ref 12.0–15.0)
MCH: 29.2 pg (ref 26.0–34.0)
MCHC: 34.8 g/dL (ref 30.0–36.0)
MCV: 83.7 fL (ref 78.0–100.0)
Platelets: 204 10*3/uL (ref 150–400)
RBC: 3.19 MIL/uL — AB (ref 3.87–5.11)
RDW: 13.9 % (ref 11.5–15.5)
WBC: 14.4 10*3/uL — AB (ref 4.0–10.5)

## 2015-07-15 LAB — ECHOCARDIOGRAM COMPLETE
Height: 65 in
Weight: 2095.25 oz

## 2015-07-15 LAB — CULTURE, BLOOD (ROUTINE X 2)
CULTURE: NO GROWTH
Culture: NO GROWTH

## 2015-07-15 MED ORDER — METOPROLOL TARTRATE 25 MG PO TABS
12.5000 mg | ORAL_TABLET | Freq: Two times a day (BID) | ORAL | Status: DC
  Administered 2015-07-15 – 2015-07-16 (×2): 12.5 mg via ORAL
  Filled 2015-07-15 (×2): qty 1

## 2015-07-15 MED ORDER — METOPROLOL TARTRATE 25 MG PO TABS: 25.0000 mg | ORAL_TABLET | Freq: Two times a day (BID) | ORAL | Status: DC

## 2015-07-15 NOTE — Care Management Note (Signed)
Case Management Note  Patient Details  Name: Marnee GuarneriHelen W Bovard MRN: [id number] Date of Birth: 1933-03-20  Expected Discharge Date:     07/18/2015             Expected Discharge Plan:  Skilled Nursing Facility  In-House Referral:  Clinical Social Work  Discharge planning Services  CM Consult  Post Acute Care Choice:  NA Choice offered to:  NA  DME Arranged:    DME Agency:     HH Arranged:    HH Agency:     Status of Service:  Completed, signed off  Medicare Important Message Given:  Yes Date Medicare IM Given:    Medicare IM give by:    Date Additional Medicare IM Given:    Additional Medicare Important Message give by:     If discussed at Long Length of Stay Meetings, dates discussed:  07/15/2015  Additional Comments:  Malcolm MetroChildress, Drewey Begue Demske, RN 07/15/2015, 2:29 PM

## 2015-07-15 NOTE — Progress Notes (Signed)
PROGRESS NOTE    [name]  NWG:[id number] DOB: October 29, 1933 DOA: 07/04/2015 PCP: [name], MD  Outpatient Specialists:    Brief Narrative: 28 yof with a hx of dementia, HTN, CVA, chronic indwelling foley, PVD s/p bilateral BKA was recently started on gentamycin for a UTI with E. Coli / decubitus ulcer. It was noted that her renal function was abnormal and her Cr was elevated to 5. Her abx were stopped and she was started on IV hydration. Repeat labs showed worsening renal function, so she was brought to the ED. She does not eat or drink well and was very dehydrated. She is being treated with IV fluids, but has not had significant recovery of renal function as of yet. Nephrology is following.   Assessment & Plan:   Principal Problem:   Acute renal failure (HCC) Active Problems:   Essential hypertension   PERIPHERAL VASCULAR DISEASE   Dehydration   h/o Stroke   CAD (coronary artery disease)   DM (diabetes mellitus), type 2, uncontrolled, periph vascular complic (HCC)   Dementia   Metabolic acidosis   Lactic acidosis   Buttock wound   Pressure ulcer   Acute renal failure.   Likely due to prerenal azotemia in the setting of dehydration and ACE inhibitor/gentamycin.   Nephrology was consulted and continues to follow  Patient is continued on IVF infusion with IV lasix.   Continue to monitor urine output  Renal ultrasound without obstructive process.  Renal function is showing slow improvement  Gram positive cocci bacteremia, 1/2 cultures positive for coagulase negative staph and enterococcus.   This culture was drawn from PICC line.   On 07/08/2015, Dr. Kerry Hough had discussed with Dr. Anne Hahn with ID who recommended removing the PICC line, discontinuing antibiotics and then repeating blood cultures on 4/27  Repeat blood cultures neg thus far x 2  Hypokalemia  Replaced  Continue to monitor  Hypomagnesemia  Replaced  Metabolic acidosis.   Due to renal failure.    Continue IVFs.   Lactic acidosis.   Anticipate resolution with tx renal failure.   Continue IVFs.   Holding metformin.  Dehydration.  Clinically improved with IV fluid hydration  Essential HTN.   Patient is continued on norvasc.   Continued hydralazine prn.   Continue to hold ace-inhibitor per above  DM type 2 with peripheral vascular complications.   Continue SSI.   Stage III Decubitus ulcer on buttock, present on admisison.   Does not appear to be actively infected.   Continue wound care.  Skilled Nursing Agency requested follow up wound culture. Wound culture is without growth  Dementia.   Stable.  Patient seems more alert  Wide complex tachycardia, nonsustained  Possibly secondary to recent hypokalemia, hypomagnesemia, metabolic acidosis related to renal sufficiency  Electrolytes have been normalized  Amiodarone was added to metoprolol on 4/29  On 4/30, patient noted to be bradycardic with heart rate down to the mid 30s, asymptomatic. Subsequently L amiodarone as well as metoprolol. Discussed case with cardiology who agreed with plan.  2-D echocardiogram performed, pending results  Heart rate has normalized  Patient has tolerated low-dose metoprolol overnight. Will increase to 25 mg twice a day as tolerated.   DVT prophylaxis: Heparin subcutaneous Code Status: Full Family Communication: Patient in room, family currently not at bedside Disposition Plan: Uncertain at this time  Consultants:   Nephrology  Procedures:     Antimicrobials:   Levaquin 4/21 >>4/25  Vanc 4/21 >> 4/25  Azactam 4/21 >> 4/22  Subjective: Patient is awake, confused, does respond with waving appropriately  Objective: Filed Vitals:   07/14/15 0620 07/14/15 1323 07/14/15 2312 07/15/15 0700  BP: 137/53 135/68 125/83 130/68  Pulse: 70 74 75 74  Temp: 97.8 F (36.6 C) 98.2 F (36.8 C) 97.4 F (36.3 C) 97 F (36.1 C)  TempSrc: Axillary Oral Axillary  Axillary  Resp: 20 18 20 20   Height:      Weight:      SpO2: 95% 97% 95% 96%    Intake/Output Summary (Last 24 hours) at 07/15/15 1123 Last data filed at 07/15/15 0931  Gross per 24 hour  Intake   1080 ml  Output   1900 ml  Net   -820 ml   Filed Weights   07/08/15 0616 07/09/15 0600 07/10/15 0500  Weight: 48.9 kg (107 lb 12.9 oz) 59.6 kg (131 lb 6.3 oz) 59.4 kg (130 lb 15.3 oz)    Examination:  General exam:Awake, appears comfortable, in bed Respiratory system: Clear to auscultation. Respiratory effort normal. Cardiovascular system: S1 & S2 heard, RRR.  Gastrointestinal system: Abdomen is nondistended,  Normal bowel sounds heard. Central nervous system: Asleep, easily arousable. No focal neurological deficits. Extremities: Symmetric 5 x 5 power. Skin: No rashes, lesions Psychiatry: Difficult to fully assess, Patient awake, confused    Data Reviewed: I have personally reviewed following labs and imaging studies  CBC:  Recent Labs Lab 07/09/15 0434 07/14/15 0443 07/15/15 0442  WBC 11.7* 14.5* 14.4*  HGB 10.0* 9.4* 9.3*  HCT 28.3* 26.6* 26.7*  MCV 82.5 83.1 83.7  PLT 258 199 204   Basic Metabolic Panel:  Recent Labs Lab 07/10/15 0409 07/11/15 0424 07/11/15 2150 07/12/15 0636 07/13/15 0625 07/14/15 0443 07/15/15 0442  NA 141 136 133* 133* 131* 135 139  K 2.8* 2.8* 3.8 4.2 4.8 4.2 3.5  CL 103 98* 99* 100* 101 105 110  CO2 18* 18* 20* 20* 21* 20* 19*  GLUCOSE 103* 122* 112* 124* 134* 116* 116*  BUN 85* 84* 79* 79* 72* 72* 64*  CREATININE 5.89* 6.19* 5.82* 5.52* 5.07* 4.91* 4.69*  CALCIUM 6.4* 6.3* 6.6* 6.8* 7.0* 7.2* 7.4*  MG 0.9* 1.4* 1.9 1.7 2.2  --   --   PHOS  --  5.3*  --  4.1  --  3.4 4.0   GFR: Estimated Creatinine Clearance: 8.8 mL/min (by C-G formula based on Cr of 4.69). Liver Function Tests:  Recent Labs Lab 07/11/15 0424 07/12/15 0636 07/14/15 0443 07/15/15 0442  ALBUMIN 1.9* 2.0* 2.0* 2.1*   No results for input(s): LIPASE,  AMYLASE in the last 168 hours. No results for input(s): AMMONIA in the last 168 hours. Coagulation Profile: No results for input(s): INR, PROTIME in the last 168 hours. Cardiac Enzymes: No results for input(s): CKTOTAL, CKMB, CKMBINDEX, TROPONINI in the last 168 hours. BNP (last 3 results) No results for input(s): PROBNP in the last 8760 hours. HbA1C: No results for input(s): HGBA1C in the last 72 hours. CBG:  Recent Labs Lab 07/14/15 0731 07/14/15 1128 07/14/15 1640 07/14/15 2121 07/15/15 0732  GLUCAP 105* 109* 107* 117* 102*   Lipid Profile: No results for input(s): CHOL, HDL, LDLCALC, TRIG, CHOLHDL, LDLDIRECT in the last 72 hours. Thyroid Function Tests: No results for input(s): TSH, T4TOTAL, FREET4, T3FREE, THYROIDAB in the last 72 hours. Anemia Panel: No results for input(s): VITAMINB12, FOLATE, FERRITIN, TIBC, IRON, RETICCTPCT in the last 72 hours. Urine analysis:    Component Value Date/Time   COLORURINE YELLOW 07/04/2015 1945  APPEARANCEUR CLOUDY* 07/04/2015 1945   LABSPEC 1.025 07/04/2015 1945   PHURINE 5.5 07/04/2015 1945   GLUCOSEU 500* 07/04/2015 1945   HGBUR MODERATE* 07/04/2015 1945   BILIRUBINUR NEGATIVE 07/04/2015 1945   KETONESUR TRACE* 07/04/2015 1945   PROTEINUR 100* 07/04/2015 1945   UROBILINOGEN 0.2 01/11/2014 2247   NITRITE NEGATIVE 07/04/2015 1945   LEUKOCYTESUR MODERATE* 07/04/2015 1945   Sepsis Labs: @LABRCNTIP (procalcitonin:4,lacticidven:4)  ) Recent Results (from the past 240 hour(s))  Culture, blood (routine x 2)     Status: None   Collection Time: 07/10/15  2:26 PM  Result Value Ref Range Status   Specimen Description BLOOD LEFT ANTECUBITAL  Final   Special Requests BOTTLES DRAWN AEROBIC AND ANAEROBIC 6CC  Final   Culture NO GROWTH 5 DAYS  Final   Report Status 07/15/2015 FINAL  Final  Culture, blood (routine x 2)     Status: None   Collection Time: 07/10/15  4:46 PM  Result Value Ref Range Status   Specimen Description BLOOD  PICC LINE DRAWN BY RN  Final   Special Requests BOTTLES DRAWN AEROBIC AND ANAEROBIC 10CC EACH  Final   Culture NO GROWTH 5 DAYS  Final   Report Status 07/15/2015 FINAL  Final  Wound culture     Status: None   Collection Time: 07/10/15  5:52 PM  Result Value Ref Range Status   Specimen Description DECUBITIS  Final   Special Requests NONE  Final   Gram Stain   Final    NO WBC SEEN NO SQUAMOUS EPITHELIAL CELLS SEEN NO ORGANISMS SEEN Performed at [facility]    Culture   Final    NO GROWTH 2 DAYS Performed at [facility]    Report Status 07/13/2015 FINAL  Final         Radiology Studies: No results found.      Scheduled Meds: . amLODipine  5 mg Oral Daily  . aspirin EC  81 mg Oral Daily  . feeding supplement (NEPRO CARB STEADY)  237 mL Oral TID WC  . feeding supplement (NEPRO CARB STEADY)  237 mL Oral TID BM  . feeding supplement (PRO-STAT SUGAR FREE 64)  30 mL Oral BID  . furosemide  40 mg Intravenous BID  . heparin  5,000 Units Subcutaneous Q8H  . HYDROcodone-acetaminophen  1 tablet Oral Q12H  . insulin aspart  0-9 Units Subcutaneous TID WC  . metoprolol tartrate  25 mg Oral BID  . sodium chloride flush  3 mL Intravenous Q12H   Continuous Infusions: . sodium chloride 135 mL/hr at 07/15/15 0511     LOS: 11 days     CHIU, Scheryl Marten, MD [facility] Pager [phone]  If 7PM-7AM, please contact night-coverage [email or web address] Password [facility] 07/15/2015, 11:23 AM

## 2015-07-15 NOTE — Progress Notes (Signed)
Subjective: Interval History: Patient offers no complaints.  Objective: Vital signs in last 24 hours: Temp:  [97.4 F (36.3 C)-98.2 F (36.8 C)] 97.4 F (36.3 C) (05/01 2312) Pulse Rate:  [74-75] 75 (05/01 2312) Resp:  [18-20] 20 (05/01 2312) BP: (125-135)/(68-83) 125/83 mmHg (05/01 2312) SpO2:  [95 %-97 %] 95 % (05/01 2312) Weight change:   Intake/Output from previous day: 05/01 0701 - 05/02 0700 In: 1080 [I.V.:1080] Out: 1900 [Urine:1900] Intake/Output this shift: Total I/O In: -  Out: 900 [Urine:900] Generally patient is sommolent but arousable and in no apparent distress Chest is clear to auscultation Heart examination regular rate and rhythm no murmur or S3 Extremities: She is status post bilateral BKA and no edema   Lab Results:  Recent Labs  07/14/15 0443 07/15/15 0442  WBC 14.5* 14.4*  HGB 9.4* 9.3*  HCT 26.6* 26.7*  PLT 199 204   BMET:   Recent Labs  07/14/15 0443 07/15/15 0442  NA 135 139  K 4.2 3.5  CL 105 110  CO2 20* 19*  GLUCOSE 116* 116*  BUN 72* 64*  CREATININE 4.91* 4.69*  CALCIUM 7.2* 7.4*   No results for input(s): PTH in the last 72 hours. Iron Studies: No results for input(s): IRON, TIBC, TRANSFERRIN, FERRITIN in the last 72 hours.  Studies/Results: No results found.  I have reviewed the patient's current medications.  Assessment/Plan: Problem #1 acute kidney injury superimposed on chronic. Thought to be secondary to gentamicin/ACE/vancomycin.All the above medications has been discontinued. Patient is presently nonoliguric Her renal function slowly continue to improve. She had 1900 cc of urine out put Problem #2 hypokalemia: Potassium has remained normal but slightly declining.. Problem #3. Low CO2: Possibly metabolic. He remains stable  Problem #4 anemia: Her hemoglobin is low but stable.. Problem #5 history of diabetes: Her blood sugar is reasonably controlled. Problem #6 urinary tract infection patient on antibiotics:  Patient is a febrile and her white blood cell count is still high. Problem #7 history of CVA Problem #8. Metabolic bone disease : Her calcium and phosphorus is range. Problem #9 hypertension: Her blood pressure is reasonably controlled Plan: 1] will continue his normal saline at present rate.  2] check her renal panel in the morning.   LOS: 11 days   [name] S 07/15/2015,6:31 AM

## 2015-07-15 NOTE — Clinical Social Work Note (Signed)
Updated [facility] on pt this morning. Facility remains willing to accept pt when medically stable.  Derenda FennelKara Nataleigh Griffin, LCSW [phone]

## 2015-07-16 LAB — BASIC METABOLIC PANEL
Anion gap: 10 (ref 5–15)
BUN: 69 mg/dL — AB (ref 6–20)
CHLORIDE: 113 mmol/L — AB (ref 101–111)
CO2: 18 mmol/L — AB (ref 22–32)
CREATININE: 5.1 mg/dL — AB (ref 0.44–1.00)
Calcium: 7.7 mg/dL — ABNORMAL LOW (ref 8.9–10.3)
GFR calc Af Amer: 8 mL/min — ABNORMAL LOW (ref >60)
GFR calc non Af Amer: 7 mL/min — ABNORMAL LOW (ref >60)
Glucose, Bld: 123 mg/dL — ABNORMAL HIGH (ref 65–99)
Potassium: 3.4 mmol/L — ABNORMAL LOW (ref 3.5–5.1)
SODIUM: 141 mmol/L (ref 135–145)

## 2015-07-16 LAB — GLUCOSE, CAPILLARY
Glucose-Capillary: 117 mg/dL — ABNORMAL HIGH (ref 65–99)
Glucose-Capillary: 110 mg/dL — ABNORMAL HIGH (ref 65–99)
Glucose-Capillary: 115 mg/dL — ABNORMAL HIGH (ref 65–99)

## 2015-07-16 MED ORDER — METOPROLOL TARTRATE 25 MG PO TABS
25.0000 mg | ORAL_TABLET | Freq: Two times a day (BID) | ORAL | Status: DC
  Administered 2015-07-17 – 2015-07-21 (×6): 25 mg via ORAL
  Filled 2015-07-16 (×9): qty 1

## 2015-07-16 NOTE — Care Management Important Message (Signed)
Important Message  Patient Details  Name: Sara GuarneriHelen W Konczal MRN: [id number] Date of Birth: [birth year]   Medicare Important Message Given:  Yes    Adonis HugueninBerkhead, Briahnna Harries L, RN 07/16/2015, 1:41 PM

## 2015-07-16 NOTE — Progress Notes (Signed)
PROGRESS NOTE    [name]  UJW:[id number] DOB: 1933-04-11 DOA: 07/04/2015 PCP: Dwana MelenaZack Hall, MD     Brief Narrative:  82 year old woman admitted to the hospital on 4/21 due to acute renal failure which time her creatinine was noted to be around 5. She had been on gentamicin for an Escherichia coli UTI/decubitus ulcer. Her antibiotics have since been discontinued and she was started on IV hydration. Nephrology is involved. She has not had significant recovery of renal function as of yet; will need to discuss with nephrology ultimate goals and treatment plan.   Assessment & Plan:   Principal Problem:   Acute renal failure (HCC) Active Problems:   Essential hypertension   PERIPHERAL VASCULAR DISEASE   Dehydration   h/o Stroke   CAD (coronary artery disease)   DM (diabetes mellitus), type 2, uncontrolled, periph vascular complic (HCC)   Dementia   Metabolic acidosis   Lactic acidosis   Buttock wound   Pressure ulcer    Acute renal failure.   Likely due to prerenal azotemia/intrinsic kidney damage in the setting of dehydration and ACE inhibitor/gentamycin.   Nephrology was consulted and continues to follow  Patient is continued on IVF infusion with IV lasix. Management as per nephrology.  Continue to monitor urine output  Renal ultrasound without obstructive process.  Gram positive cocci bacteremia, 1/2 cultures positive for coagulase negative staph and enterococcus.   This culture was drawn from PICC line.   On 07/08/2015, Dr. Kerry HoughMemon had discussed with Dr. Anne HahnSynder with ID who recommended removing the PICC line, discontinuing antibiotics and then repeating blood cultures on 4/27  Repeat blood cultures neg thus far x 2  Not currently on antibiotics.  Hypokalemia  Mild, replace orally.  Continue to monitor  Hypomagnesemia  Replaced; last magnesium was 2.1 on 5/2.  Metabolic acidosis.  Due to renal failure/lactic acidosis.   Continue IVFs.   Lactic  acidosis.  Anticipate resolution with tx renal failure.   Continue IVFs.   Holding metformin.  Dehydration.  Clinically improved with IV fluid hydration  Essential HTN.  Patient is continued on norvasc.   Continued hydralazine prn.   Continue to hold ace-inhibitor per above  DM type 2 with peripheral vascular complications.   Continue SSI.   Well-controlled.  Stage III Decubitus ulcer on buttock, present on admisison.  Does not appear to be actively infected.   Continue wound care.  [facility] requested follow up wound culture. Wound culture is without growth  Dementia.  Stable.  Patient seems more alert  Wide complex tachycardia, nonsustained  Possibly secondary to recent hypokalemia, hypomagnesemia, metabolic acidosis related to renal insufficiency  Electrolytes have been normalized  Amiodarone was added to metoprolol on 4/29  On 4/30, patient noted to be bradycardic with heart rate down to the mid 30s, asymptomatic. Subsequently amiodarone was discontinued l. Discussed case with cardiology who agreed with plan. 2-D echocardiogram performed shows an intact ejection fraction of 65-70% without wall motion abnormalities and not technically sufficient to evaluate diastolic function.  Heart rate has normalized  Patient has tolerated low-dose metoprolol overnight. Will increase to 25 mg twice a day as tolerated.   DVT prophylaxis: Subcutaneous heparin Code Status: Full code Family Communication: Patient only [facility]  Consultants:   Nephrology, Dr. Fausto SkillernBefakadu  Procedures:   None  Antimicrobials:   None    Subjective:  Lying in bed, not very conversant, somnolent  Objective:  Filed Vitals:   07/15/15 1400 07/15/15 2156 07/16/15 0608 07/16/15 1412  BP: 106/46 157/65 122/67 145/55  Pulse: 82 86 89 77  Temp: 97.7 F (36.5 C) 97.9 F (36.6 C) 97.6 F  (36.4 C) 97.6 F (36.4 C)  TempSrc: Axillary Axillary Axillary Oral  Resp: Height:      Weight:   63.5 kg (139 lb 15.9 oz)   SpO2: 95% 96% 94% 92%    Intake/Output Summary (Last 24 hours) at 07/16/15 1550 Last data filed at 07/16/15 1413  Gross per 24 hour  Intake   2840 ml  Output   1250 ml  Net   1590 ml   Filed Weights   07/09/15 0600 07/10/15 0500 07/16/15 0608  Weight: 59.6 kg (131 lb 6.3 oz) 59.4 kg (130 lb 15.3 oz) 63.5 kg (139 lb 15.9 oz)    Examination:   General exam: Drowsy, not conversant  Respiratory system: Clear to auscultation. Respiratory effort normal. Cardiovascular system:RRR. No murmurs, rubs, gallops. Gastrointestinal system: Abdomen is nondistended, soft and nontender. No organomegaly or masses felt. Normal bowel sounds heard. Central nervous system:  Unable to fully assess given drowsiness Extremities:  Bilateral below-knee amputations Skin: No rashes, lesions or ulcers Psychiatry:  Unable to fully assess given drowsiness    Data Reviewed: I have personally reviewed following labs and imaging studies  CBC:  Recent Labs Lab 07/14/15 0443 07/15/15 0442  WBC 14.5* 14.4*  HGB 9.4* 9.3*  HCT 26.6* 26.7*  MCV 83.1 83.7  PLT 199 204   Basic Metabolic Panel:  Recent Labs Lab 07/10/15 0409 07/11/15 0424 07/11/15 2150 07/12/15 0636 07/13/15 0625 07/14/15 0443 07/15/15 0442 07/16/15 0630  NA 141 136 133* 133* 131* 135 139 141  K 2.8* 2.8* 3.8 4.2 4.8 4.2 3.5 3.4*  CL 103 98* 99* 100* 101 105 110 113*  CO2 18* 18* 20* 20* 21* 20* 19* 18*  GLUCOSE 103* 122* 112* 124* 134* 116* 116* 123*  BUN 85* 84* 79* 79* 72* 72* 64* 69*  CREATININE 5.89* 6.19* 5.82* 5.52* 5.07* 4.91* 4.69* 5.10*  CALCIUM 6.4* 6.3* 6.6* 6.8* 7.0* 7.2* 7.4* 7.7*  MG 0.9* 1.4* 1.9 1.7 2.2  --   --   --   PHOS  --  5.3*  --  4.1  --  3.4 4.0  --    GFR: Estimated Creatinine Clearance: 8 mL/min (by C-G formula based on Cr of 5.1). Liver Function  Tests:  Recent Labs Lab 07/11/15 0424 07/12/15 0636 07/14/15 0443 07/15/15 0442  ALBUMIN 1.9* 2.0* 2.0* 2.1*   No results for input(s): LIPASE, AMYLASE in the last 168 hours. No results for input(s): AMMONIA in the last 168 hours. Coagulation Profile: No results for input(s): INR, PROTIME in the last 168 hours. Cardiac Enzymes: No results for input(s): CKTOTAL, CKMB, CKMBINDEX, TROPONINI in the last 168 hours. BNP (last 3 results) No results for input(s): PROBNP in the last 8760 hours. HbA1C: No results for input(s): HGBA1C in the last 72 hours. CBG:  Recent Labs Lab 07/15/15 1154 07/15/15 1608 07/15/15 2154 07/16/15 0721 07/16/15 1125  GLUCAP 109* 104* 128* 117* 110*   Lipid Profile: No results for input(s): CHOL, HDL, LDLCALC, TRIG, CHOLHDL, LDLDIRECT in the last 72 hours. Thyroid Function Tests: No results for input(s): TSH, T4TOTAL, FREET4, T3FREE, THYROIDAB in the last 72 hours. Anemia Panel: No results for input(s): VITAMINB12, FOLATE, FERRITIN, TIBC, IRON, RETICCTPCT in the last 72 hours. Urine analysis:    Component  Value Date/Time   COLORURINE YELLOW 07/04/2015 1945   APPEARANCEUR CLOUDY* 07/04/2015 1945   LABSPEC 1.025 07/04/2015 1945   PHURINE 5.5 07/04/2015 1945   GLUCOSEU 500* 07/04/2015 1945   HGBUR MODERATE* 07/04/2015 1945   BILIRUBINUR NEGATIVE 07/04/2015 1945   KETONESUR TRACE* 07/04/2015 1945   PROTEINUR 100* 07/04/2015 1945   UROBILINOGEN 0.2 01/11/2014 2247   NITRITE NEGATIVE 07/04/2015 1945   LEUKOCYTESUR MODERATE* 07/04/2015 1945   Sepsis Labs: @LABRCNTIP (procalcitonin:4,lacticidven:4)  ) Recent Results (from the past 240 hour(s))  Culture, blood (routine x 2)     Status: None   Collection Time: 07/10/15  2:26 PM  Result Value Ref Range Status   Specimen Description BLOOD LEFT ANTECUBITAL  Final   Special Requests BOTTLES DRAWN AEROBIC AND ANAEROBIC 6CC  Final   Culture NO GROWTH 5 DAYS  Final   Report Status 07/15/2015 FINAL   Final  Culture, blood (routine x 2)     Status: None   Collection Time: 07/10/15  4:46 PM  Result Value Ref Range Status   Specimen Description BLOOD PICC LINE DRAWN BY RN  Final   Special Requests BOTTLES DRAWN AEROBIC AND ANAEROBIC 10CC EACH  Final   Culture NO GROWTH 5 DAYS  Final   Report Status 07/15/2015 FINAL  Final  Wound culture     Status: None   Collection Time: 07/10/15  5:52 PM  Result Value Ref Range Status   Specimen Description DECUBITIS  Final   Special Requests NONE  Final   Gram Stain   Final    NO WBC SEEN NO SQUAMOUS EPITHELIAL CELLS SEEN NO ORGANISMS SEEN Performed at [facility]    Culture   Final    NO GROWTH 2 DAYS Performed at [facility]    Report Status 07/13/2015 FINAL  Final         Radiology Studies: No results found.      Scheduled Meds: . amLODipine  5 mg Oral Daily  . aspirin EC  81 mg Oral Daily  . feeding supplement (NEPRO CARB STEADY)  237 mL Oral TID WC  . feeding supplement (NEPRO CARB STEADY)  237 mL Oral TID BM  . feeding supplement (PRO-STAT SUGAR FREE 64)  30 mL Oral BID  . furosemide  40 mg Intravenous BID  . heparin  5,000 Units Subcutaneous Q8H  . HYDROcodone-acetaminophen  1 tablet Oral Q12H  . insulin aspart  0-9 Units Subcutaneous TID WC  . metoprolol tartrate  12.5 mg Oral BID  . sodium chloride flush  3 mL Intravenous Q12H   Continuous Infusions: . sodium chloride 135 mL/hr at 07/15/15 0511     LOS: 12 days    Time spent: 25 minutes. Greater than 50% of this time was spent in direct contact with the patient coordinating care.     Chaya Jan, MD [facility] Pager [phone]  If 7PM-7AM, please contact night-coverage [email or web address] Password [email or web address] 07/16/2015, 3:50 PM

## 2015-07-16 NOTE — Progress Notes (Signed)
[name]  MRN: [id number]  DOB/AGE: Apr 06, 1933 82 y.o.  Primary Care Physician:Zack Margo Aye, MD  Admit date: 07/04/2015  Chief Complaint:  Chief Complaint  Patient presents with  . Abnormal Labs     S-Pt presented on  07/04/2015 with  Chief Complaint  Patient presents with  . Abnormal Labs   .    Pt unable to offer any complaints.     Meds  . amLODipine  5 mg Oral Daily  . aspirin EC  81 mg Oral Daily  . feeding supplement (NEPRO CARB STEADY)  237 mL Oral TID WC  . feeding supplement (NEPRO CARB STEADY)  237 mL Oral TID BM  . feeding supplement (PRO-STAT SUGAR FREE 64)  30 mL Oral BID  . furosemide  40 mg Intravenous BID  . heparin  5,000 Units Subcutaneous Q8H  . HYDROcodone-acetaminophen  1 tablet Oral Q12H  . insulin aspart  0-9 Units Subcutaneous TID WC  . metoprolol tartrate  12.5 mg Oral BID  . sodium chloride flush  3 mL Intravenous Q12H      Physical Exam: Vital signs in last 24 hours: Temp:  [97.6 F (36.4 C)-97.9 F (36.6 C)] 97.6 F (36.4 C) (05/03 4098) Pulse Rate:  [82-94] 89 (05/03 0608) Resp:  [16-20] 16 (05/03 1191) BP: (90-157)/(46-67) 122/67 mmHg (05/03 0608) SpO2:  [94 %-96 %] 94 % (05/03 0608) Weight:  [139 lb 15.9 oz (63.5 kg)] 139 lb 15.9 oz (63.5 kg) (05/03 4782) Weight change:  Last BM Date: 08/07/15  Intake/Output from previous day: 05/02 0701 - 05/03 0700 In: 2840 [I.V.:2840] Out: 1000 [Urine:1000]     Physical Exam: General- pt does not communicate Resp- No acute REsp distress, decreased at bases. CVS- S1S2 regular in rate and rhythm, SEM + GIT- BS+, soft, NT, ND EXT- b/L BKA, trace Edema,No Cyanosis   Lab Results: HGb  10.0  BMET  Recent Labs  07/15/15 0442 07/16/15 0630  NA 139 141  K 3.5 3.4*  CL 110 113*  CO2 19* 18*  GLUCOSE 116* 123*  BUN 64* 69*  CREATININE 4.69* 5.10*  CALCIUM 7.4* 7.7*   Creat trend 2017  6.1=>4.69=>5.1    MICRO Recent Results (from the past 240 hour(s))  Culture, blood  (routine x 2)     Status: None   Collection Time: 07/10/15  2:26 PM  Result Value Ref Range Status   Specimen Description BLOOD LEFT ANTECUBITAL  Final   Special Requests BOTTLES DRAWN AEROBIC AND ANAEROBIC 6CC  Final   Culture NO GROWTH 5 DAYS  Final   Report Status 07/15/2015 FINAL  Final  Culture, blood (routine x 2)     Status: None   Collection Time: 07/10/15  4:46 PM  Result Value Ref Range Status   Specimen Description BLOOD PICC LINE DRAWN BY RN  Final   Special Requests BOTTLES DRAWN AEROBIC AND ANAEROBIC 10CC EACH  Final   Culture NO GROWTH 5 DAYS  Final   Report Status 07/15/2015 FINAL  Final  Wound culture     Status: None   Collection Time: 07/10/15  5:52 PM  Result Value Ref Range Status   Specimen Description DECUBITIS  Final   Special Requests NONE  Final   Gram Stain   Final    NO WBC SEEN NO SQUAMOUS EPITHELIAL CELLS SEEN NO ORGANISMS SEEN Performed at [facility]    Culture   Final    NO GROWTH 2 DAYS Performed at [facility]  Report Status 07/13/2015 FINAL  Final      Lab Results  Component Value Date   CALCIUM 7.7* 07/16/2015   PHOS 4.0 07/15/2015   Alb 2.0 Corrected calcium 6.5+1.6=8.1     Impression: 1)Renal  AKI secondary to ATN               AKI sec to Gentamycin /ACE               Non oliguric ATN               Creat now trending up again.                Will follow up ion bmet.    2)HTN  Medication- On Diuretics- On Calcium Channel Blockers On Beta blockers    3)Anemia HGb stable  4)Hypocalcemia Phosphorus at goal. After correcting for low albumin calcium is still low but stable  5)ID-Blood cultures positive for Coagulase negative staph and enterococcus PICC line now d/ced  6)Electrolytes Hypokalemic   Being replte  Hyponatremic   Now better  7)Acid base Co2 not at goal     Plan:  Will continue current treatment plan     Blong Busk S 07/16/2015, 9:15 AM

## 2015-07-17 ENCOUNTER — Encounter (HOSPITAL_COMMUNITY): Payer: Self-pay | Admitting: Primary Care

## 2015-07-17 DIAGNOSIS — Z515 Encounter for palliative care: Secondary | ICD-10-CM | POA: Insufficient documentation

## 2015-07-17 LAB — BASIC METABOLIC PANEL
ANION GAP: 12 (ref 5–15)
BUN: 64 mg/dL — ABNORMAL HIGH (ref 6–20)
CO2: 18 mmol/L — ABNORMAL LOW (ref 22–32)
Calcium: 7.6 mg/dL — ABNORMAL LOW (ref 8.9–10.3)
Chloride: 115 mmol/L — ABNORMAL HIGH (ref 101–111)
Creatinine, Ser: 5.14 mg/dL — ABNORMAL HIGH (ref 0.44–1.00)
GFR, EST AFRICAN AMERICAN: 8 mL/min — AB (ref >60)
GFR, EST NON AFRICAN AMERICAN: 7 mL/min — AB (ref >60)
Glucose, Bld: 117 mg/dL — ABNORMAL HIGH (ref 65–99)
POTASSIUM: 2.7 mmol/L — AB (ref 3.5–5.1)
SODIUM: 145 mmol/L (ref 135–145)

## 2015-07-17 LAB — GLUCOSE, CAPILLARY
GLUCOSE-CAPILLARY: 102 mg/dL — AB (ref 65–99)
GLUCOSE-CAPILLARY: 108 mg/dL — AB (ref 65–99)
GLUCOSE-CAPILLARY: 94 mg/dL (ref 65–99)
GLUCOSE-CAPILLARY: 116 mg/dL — AB (ref 65–99)
Glucose-Capillary: 103 mg/dL — ABNORMAL HIGH (ref 65–99)

## 2015-07-17 MED ORDER — SODIUM CHLORIDE 0.45 % IV SOLN
INTRAVENOUS | Status: DC
  Administered 2015-07-17: 10:00:00 via INTRAVENOUS
  Filled 2015-07-17 (×5): qty 1000

## 2015-07-17 MED ORDER — POTASSIUM CHLORIDE 10 MEQ/100ML IV SOLN
10.0000 meq | INTRAVENOUS | Status: AC
  Administered 2015-07-17 (×5): 10 meq via INTRAVENOUS
  Filled 2015-07-17: qty 100

## 2015-07-17 NOTE — Care Management (Addendum)
Discussed case with attending  Dr Talmage NapHernandez Paliative care consult placed. Made Paliative care NP Tasha aware of order.

## 2015-07-17 NOTE — Progress Notes (Signed)
PROGRESS NOTE    Sara GuarneriHelen W [name]  ZOX:[id number] DOB: 10-19-1933 DOA: 07/04/2015 PCP: Sara MelenaZack Hall, MD     Brief Narrative:  82 year old woman admitted to the hospital on 4/21 due to acute renal failure which time her creatinine was noted to be around 5. She had been on gentamicin for an Escherichia coli UTI/decubitus ulcer. Her antibiotics have since been discontinued and she was started on IV hydration. Nephrology is involved. She has not had significant recovery of renal function as of yet; will need to discuss with nephrology ultimate goals and treatment plan. Palliative care consultation was requested on 5/4.   Assessment & Plan:   Principal Problem:   Acute renal failure (HCC) Active Problems:   Essential hypertension   PERIPHERAL VASCULAR DISEASE   Dehydration   h/o Stroke   CAD (coronary artery disease)   DM (diabetes mellitus), type 2, uncontrolled, periph vascular complic (HCC)   Dementia   Metabolic acidosis   Lactic acidosis   Buttock wound   Pressure ulcer    Acute renal failure.   Likely due to prerenal azotemia/intrinsic kidney damage in the setting of dehydration and ACE inhibitor/gentamycin.   Nephrology was consulted and continues to follow  Patient is continued on IVF, Lasix has been discontinued by nephrology. Management as per nephrology.  Continue to monitor urine output  Renal ultrasound without obstructive process.  Gram positive cocci bacteremia, 1/2 cultures positive for coagulase negative staph and enterococcus.   This culture was drawn from PICC line.   On 07/08/2015, Dr. Kerry HoughMemon had discussed with Dr. Anne [name] with ID who recommended removing the PICC line, discontinuing antibiotics and then repeating blood cultures on 4/27  Repeat blood cultures neg thus far x 2  Not currently on antibiotics.  Hypokalemia  Mild, replace orally.  Continue to monitor  Hypomagnesemia  Replaced; last magnesium was 2.1 on 5/2.  Metabolic  acidosis.  Due to renal failure/lactic acidosis.   Continue IVFs.   Lactic acidosis.  Anticipate resolution with tx renal failure.   Continue IVFs.   Holding metformin.  Dehydration.  Clinically improved with IV fluid hydration  Essential HTN.  Patient is continued on norvasc.   Continued hydralazine prn.   Continue to hold ace-inhibitor per above  DM type 2 with peripheral vascular complications.   Continue SSI.   Well-controlled.  Stage III Decubitus ulcer on buttock, present on admisison.  Does not appear to be actively infected.   Continue wound care.  [facility] requested follow up wound culture. Wound culture is without growth  Dementia.  Stable.  Patient seems more alert  Wide complex tachycardia, nonsustained  Possibly secondary to recent hypokalemia, hypomagnesemia, metabolic acidosis related to renal insufficiency  Electrolytes have been normalized  Amiodarone was added to metoprolol on 4/29  On 4/30, patient noted to be bradycardic with heart rate down to the mid 30s, asymptomatic. Subsequently amiodarone was discontinued l. Discussed case with cardiology who agreed with plan. 2-D echocardiogram performed shows an intact ejection fraction of 65-70% without wall motion abnormalities and not technically sufficient to evaluate diastolic function.  Heart rate has normalized  Patient has tolerated low-dose metoprolol overnight. Will increase to 25 mg twice a day as tolerated.   DVT prophylaxis: Subcutaneous heparin Code Status: Full code Family Communication: Patient only [facility]  Consultants:   Nephrology, Dr. Fausto [name]  Procedures:   None  Antimicrobials:   None    Subjective:  Lying in bed, not very conversant, somnolent  Objective: Filed Vitals:   07/16/15 0608 07/16/15 1412 07/16/15 2100 07/17/15 0513  BP: 122/67 145/55 146/53  124/62  Pulse: 89 77 75 84  Temp: 97.6 F (36.4 C) 97.6 F (36.4 C) 97.6 F (36.4 C) 97.5 F (36.4 C)  TempSrc: Axillary Oral Oral Oral  Resp: 16 18 15 18   Height:      Weight: 63.5 kg (139 lb 15.9 oz)   59.7 kg (131 lb 9.8 oz)  SpO2: 94% 92% 94% 95%    Intake/Output Summary (Last 24 hours) at 07/17/15 1456 Last data filed at 07/17/15 0950  Gross per 24 hour  Intake      0 ml  Output   4350 ml  Net  -4350 ml   Filed Weights   07/10/15 0500 07/16/15 0608 07/17/15 0513  Weight: 59.4 kg (130 lb 15.3 oz) 63.5 kg (139 lb 15.9 oz) 59.7 kg (131 lb 9.8 oz)    Examination:   General exam: Drowsy, not conversant  Respiratory system: Clear to auscultation. Respiratory effort normal. Cardiovascular system:RRR. No murmurs, rubs, gallops. Gastrointestinal system: Abdomen is nondistended, soft and nontender. No organomegaly or masses felt. Normal bowel sounds heard. Central nervous system:  Unable to fully assess given drowsiness Extremities:  Bilateral below-knee amputations Skin: No rashes, lesions or ulcers Psychiatry:  Unable to fully assess given drowsiness    Data Reviewed: I have personally reviewed following labs and imaging studies  CBC:  Recent Labs Lab 07/14/15 0443 07/15/15 0442  WBC 14.5* 14.4*  HGB 9.4* 9.3*  HCT 26.6* 26.7*  MCV 83.1 83.7  PLT 199 204   Basic Metabolic Panel:  Recent Labs Lab 07/11/15 0424 07/11/15 2150 07/12/15 0636 07/13/15 0625 07/14/15 0443 07/15/15 0442 07/16/15 0630 07/17/15 0811  NA 136 133* 133* 131* 135 139 141 145  K 2.8* 3.8 4.2 4.8 4.2 3.5 3.4* 2.7*  CL 98* 99* 100* 101 105 110 113* 115*  CO2 18* 20* 20* 21* 20* 19* 18* 18*  GLUCOSE 122* 112* 124* 134* 116* 116* 123* 117*  BUN 84* 79* 79* 72* 72* 64* 69* 64*  CREATININE 6.19* 5.82* 5.52* 5.07* 4.91* 4.69* 5.10* 5.14*  CALCIUM 6.3* 6.6* 6.8* 7.0* 7.2* 7.4* 7.7* 7.6*  MG 1.4* 1.9 1.7 2.2  --   --   --   --   PHOS 5.3*  --  4.1  --  3.4 4.0  --   --     GFR: Estimated Creatinine Clearance: 8 mL/min (by C-G formula based on Cr of 5.14). Liver Function Tests:  Recent Labs Lab 07/11/15 0424 07/12/15 0636 07/14/15 0443 07/15/15 0442  ALBUMIN 1.9* 2.0* 2.0* 2.1*   No results for input(s): LIPASE, AMYLASE in the last 168 hours. No results for input(s): AMMONIA in the last 168 hours. Coagulation Profile: No results for input(s): INR, PROTIME in the last 168 hours. Cardiac Enzymes: No results for input(s): CKTOTAL, CKMB, CKMBINDEX, TROPONINI in the last 168 hours. BNP (last 3 results) No results for input(s): PROBNP in the last 8760 hours. HbA1C: No results for input(s): HGBA1C in the last 72 hours. CBG:  Recent Labs Lab 07/16/15 1125 07/16/15 1632 07/16/15 2143 07/17/15 0801 07/17/15 1146  GLUCAP 110* 115* 103* 102* 108*   Lipid Profile: No results for input(s): CHOL, HDL, LDLCALC, TRIG, CHOLHDL, LDLDIRECT in the last 72 hours. Thyroid Function Tests: No results for input(s): TSH, T4TOTAL, FREET4, T3FREE, THYROIDAB in the last 72 hours. Anemia Panel: No results for  input(s): VITAMINB12, FOLATE, FERRITIN, TIBC, IRON, RETICCTPCT in the last 72 hours. Urine analysis:    Component Value Date/Time   COLORURINE YELLOW 07/04/2015 1945   APPEARANCEUR CLOUDY* 07/04/2015 1945   LABSPEC 1.025 07/04/2015 1945   PHURINE 5.5 07/04/2015 1945   GLUCOSEU 500* 07/04/2015 1945   HGBUR MODERATE* 07/04/2015 1945   BILIRUBINUR NEGATIVE 07/04/2015 1945   KETONESUR TRACE* 07/04/2015 1945   PROTEINUR 100* 07/04/2015 1945   UROBILINOGEN 0.2 01/11/2014 2247   NITRITE NEGATIVE 07/04/2015 1945   LEUKOCYTESUR MODERATE* 07/04/2015 1945   Sepsis Labs: (procalcitonin:4,lacticidven:4)  ) Recent Results (from the past 240 hour(s))  Culture, blood (routine x 2)     Status: None   Collection Time: 07/10/15  2:26 PM  Result Value Ref Range Status   Specimen Description BLOOD LEFT ANTECUBITAL  Final   Special Requests BOTTLES DRAWN  AEROBIC AND ANAEROBIC 6CC  Final   Culture NO GROWTH 5 DAYS  Final   Report Status 07/15/2015 FINAL  Final  Culture, blood (routine x 2)     Status: None   Collection Time: 07/10/15  4:46 PM  Result Value Ref Range Status   Specimen Description BLOOD PICC LINE DRAWN BY RN  Final   Special Requests BOTTLES DRAWN AEROBIC AND ANAEROBIC 10CC EACH  Final   Culture NO GROWTH 5 DAYS  Final   Report Status 07/15/2015 FINAL  Final  Wound culture     Status: None   Collection Time: 07/10/15  5:52 PM  Result Value Ref Range Status   Specimen Description DECUBITIS  Final   Special Requests NONE  Final   Gram Stain   Final    NO WBC SEEN NO SQUAMOUS EPITHELIAL CELLS SEEN NO ORGANISMS SEEN Performed at [facility]    Culture   Final    NO GROWTH 2 DAYS Performed at [facility]    Report Status 07/13/2015 FINAL  Final         Radiology Studies: No results found.      Scheduled Meds: . amLODipine  5 mg Oral Daily  . aspirin EC  81 mg Oral Daily  . feeding supplement (NEPRO CARB STEADY)  237 mL Oral TID WC  . feeding supplement (NEPRO CARB STEADY)  237 mL Oral TID BM  . feeding supplement (PRO-STAT SUGAR FREE 64)  30 mL Oral BID  . heparin  5,000 Units Subcutaneous Q8H  . HYDROcodone-acetaminophen  1 tablet Oral Q12H  . insulin aspart  0-9 Units Subcutaneous TID WC  . metoprolol tartrate  25 mg Oral BID  . potassium chloride  10 mEq Intravenous Q1 Hr x 5  . sodium chloride flush  3 mL Intravenous Q12H   Continuous Infusions: . sodium chloride 0.45 % with kcl 135 mL/hr at 07/17/15 1014     LOS: 13 days    Time spent: 25 minutes. Greater than 50% of this time was spent in direct contact with the patient coordinating care.     Chaya Jan, MD [facility] Pager [phone]  If 7PM-7AM, please contact night-coverage [email or web address] Password [email or web address] 07/17/2015, 2:56 PM

## 2015-07-17 NOTE — Consult Note (Signed)
Consultation Note Date: 07/17/2015   Patient Name: [name]  DOB: 06-Jul-1933  MRN: [id number]  Age / Sex: 82 y.o., female  PCP: Benita Stabile, MD Referring Physician: Micael Hampshire Acost*  Reason for Consultation: Disposition, Establishing goals of care and Psychosocial/spiritual support  HPI/Patient Profile: 82 y.o. female  with past medical history of dementia, CVA, PVD S/P bilateral amputation, chronic indwelling Foley, DQ ulcer, recent UTI (E. coli) on gentamicin admitted on 07/04/2015 with worsening renal function..   Clinical Assessment and Goals of Care: [name] is lying quietly in bed. She is able to raise her head slightly and make eye contact me when I call her name. She has mostly unintelligible speech. She is noted to have dependent edema.  [name] does shake her head no when asked about pain.  There is no family at bedside.  [name], [name] is unable to make her own decisions at this time unable to reach family. Son Casimiro Needle daughter [name].    SUMMARY OF RECOMMENDATIONS   treat the treatable at this point. Further in-depth goals of care discussion needs to be held with family.  Code Status/Advance Care Planning:  Full code   Symptom Management:   per hospitalist.  Palliative Prophylaxis:   Aspiration, Frequent Pain Assessment, Oral Care, Palliative Wound Care and Turn Reposition  Additional Recommendations (Limitations, Scope, Preferences):  Treat the treatable at this point.  Psycho-social/Spiritual:   Desire for further Chaplaincy support:no  Additional Recommendations: Caregiving  Support/Resources  Prognosis:   Unable to determine, based on outcomes  Discharge Planning: Skilled Nursing Facility for rehab with Palliative care service follow-up      Primary Diagnoses: Present on Admission:  . Essential hypertension . PERIPHERAL VASCULAR DISEASE . Acute  renal failure (HCC) . h/o Stroke . CAD (coronary artery disease) . (Resolved) UTI (lower urinary tract infection) . Dementia . Metabolic acidosis . Lactic acidosis . DM (diabetes mellitus), type 2, uncontrolled, periph vascular complic (HCC) . Dehydration . Buttock wound  I have reviewed the medical record, interviewed the patient and family, and examined the patient. The following aspects are pertinent.  Past Medical History  Diagnosis Date  . HTN (hypertension)   . Stroke (HCC)   . Diabetes mellitus without complication (HCC)   . Embolism (HCC)   . PVD (peripheral vascular disease) (HCC)   . Dementia    Social History   Social History  . Marital Status: Widowed    Spouse Name: N/A  . Number of Children: N/A  . Years of Education: N/A   Social History Main Topics  . Smoking status: Never Smoker   . Smokeless tobacco: None  . Alcohol Use: No  . Drug Use: No  . Sexual Activity: No   [name] Topics Concern  . None   Social History Narrative   History reviewed. No pertinent family history. Scheduled Meds: . amLODipine  5 mg Oral Daily  . aspirin EC  81 mg Oral Daily  . feeding supplement (NEPRO CARB STEADY)  237 mL  Oral TID WC  . feeding supplement (NEPRO CARB STEADY)  237 mL Oral TID BM  . feeding supplement (PRO-STAT SUGAR FREE 64)  30 mL Oral BID  . heparin  5,000 Units Subcutaneous Q8H  . HYDROcodone-acetaminophen  1 tablet Oral Q12H  . insulin aspart  0-9 Units Subcutaneous TID WC  . metoprolol tartrate  25 mg Oral BID  . potassium chloride  10 mEq Intravenous Q1 Hr x 5  . sodium chloride flush  3 mL Intravenous Q12H   Continuous Infusions: . sodium chloride 0.45 % with kcl 135 mL/hr at 07/17/15 1014   PRN Meds:.hydrALAZINE, ondansetron **OR** ondansetron (ZOFRAN) IV, prochlorperazine Medications Prior to Admission:  Prior to Admission medications   Medication Sig Start Date End Date Taking? Authorizing Provider  Amino Acids-Protein Hydrolys (FEEDING  SUPPLEMENT, PRO-STAT SUGAR FREE 64,) LIQD Take 30 mLs by mouth 2 (two) times daily.   Yes Historical Provider, MD  aspirin EC 81 MG tablet Take 81 mg by mouth daily.   Yes Historical Provider, MD  cholecalciferol (VITAMIN D) 1000 UNITS tablet Take 2,000 Units by mouth daily.    Yes Historical Provider, MD  Cranberry 450 MG CAPS Take 450 mg by mouth daily.   Yes Historical Provider, MD  GENTAMICIN SULFATE IJ Inject 250 mg into the vein every 36 (thirty-six) hours. 3 total administrations in 0.9% NSS   Yes Historical Provider, MD  Heparin Lock Flush (HEPARIN NICU/SCN FLUSH) 1 UNIT/ML SOLN Inject 0.5-1.7 mLs into the vein daily. 14 day course starting on 06/20/15 (5ml)   Yes Historical Provider, MD  HYDROcodone-acetaminophen (NORCO/VICODIN) 5-325 MG tablet Take 1 tablet by mouth every 12 (twelve) hours.   Yes Historical Provider, MD  insulin aspart (NOVOLOG) 100 UNIT/ML injection Inject 2-5 Units into the skin 4 (four) times daily - after meals and at bedtime. Per sliding. 200-250= 2 units >70 call MD; 251-300 = 3 units; 301-350 = 4 units; 351-400 = 5 units; <400 call MD.   Yes Historical Provider, MD  lisinopril (PRINIVIL,ZESTRIL) 20 MG tablet Take 20 mg by mouth daily.   Yes Historical Provider, MD  lovastatin (MEVACOR) 40 MG tablet Take 60 mg by mouth at bedtime.    Yes Historical Provider, MD  metFORMIN (GLUCOPHAGE-XR) 500 MG 24 hr tablet Take 1,500 mg by mouth daily with breakfast.   Yes Historical Provider, MD  omeprazole (PRILOSEC) 20 MG capsule Take 20 mg by mouth daily. 05/26/12  Yes Joselyn Arrow, NP  senna-docusate (SENNA PLUS) 8.6-50 MG tablet Take 1 tablet by mouth 2 (two) times daily.   Yes Historical Provider, MD  sitaGLIPtin (JANUVIA) 100 MG tablet Take 100 mg by mouth daily.   Yes Historical Provider, MD  sodium chloride 0.45 % Inject into the vein See admin instructions. 95ml/hr every shift for abnormal labs for 2 liters   Yes Historical Provider, MD  trimethoprim (TRIMPEX) 100 MG  tablet Take 100 mg by mouth daily.   Yes Historical Provider, MD   Allergies  Allergen Reactions  . Penicillins     REACTION: unknown reaction: MAR reported   Review of Systems  Unable to perform ROS: Dementia    Physical Exam  Constitutional: No distress.  Cardiovascular: Normal rate and regular rhythm.   Pulmonary/Chest: Effort normal. No respiratory distress.  Abdominal: Soft. There is no guarding.  Genitourinary:  Chronic indwelling Foley catheter with red tinged urine.  Musculoskeletal:  B/L amputation of lower extremities  Neurological:  Opens eyes to command, unable to answer questions.  Skin: Skin is warm and dry.  Nursing note and vitals reviewed.   Vital Signs: BP 141/52 mmHg  Pulse 59  Temp(Src) 97.5 F (36.4 C) (Oral)  Resp 16  Ht 5\' 5"  (1.651 m)  Wt 59.7 kg (131 lb 9.8 oz)  BMI 21.90 kg/m2  SpO2 98% Pain Assessment: No/denies pain   Pain Score: 0-No pain   SpO2: SpO2: 98 % O2 Device:SpO2: 98 % O2 Flow Rate: .   IO: Intake/output summary:  Intake/Output Summary (Last 24 hours) at 07/17/15 1704 Last data filed at 07/17/15 0950  Gross per 24 hour  Intake      0 ml  Output   4350 ml  Net  -4350 ml    LBM: Last BM Date: 07/17/15 Baseline Weight: Weight: 45.36 kg (100 lb) Most recent weight: Weight: 59.7 kg (131 lb 9.8 oz)     Palliative Assessment/Data:   Flowsheet Rows        Most Recent Value   Intake Tab    Referral Department  Hospitalist   Unit at Time of Referral  Med/Surg Unit   Palliative Care Primary Diagnosis  Sepsis/Infectious Disease   Date Notified  07/17/15   Palliative Care Type  New Palliative care   Reason for referral  Clarify Goals of Care   Date of Admission  07/04/15   Date first seen by Palliative Care  07/17/15   # of days Palliative referral response time  0 Day(s)   # of days IP prior to Palliative referral  13   Clinical Assessment    Palliative Performance Scale Score  20%   Pain Max last 24 hours  Not able  to report   Pain Min Last 24 hours  Not able to report   Dyspnea Max Last 24 Hours  Not able to report   Dyspnea Min Last 24 hours  Not able to report   Psychosocial & Spiritual Assessment    Palliative Care Outcomes    Patient/Family meeting held?  No   Palliative Care Outcomes  Provided psychosocial or spiritual support   Palliative Care follow-up planned  -- [Follow up at [facility]]      Time In: 1105 Time Out: 1135 Time Total: 30 minutes. Greater than 50%  of this time was spent counseling and coordinating care related to the above assessment and plan.  Signed by: Katheran Aweove,Amir Fick A, NP   Please contact Palliative Medicine Team phone at [phone] for questions and concerns.  For individual provider: See Loretha StaplerAmion

## 2015-07-17 NOTE — Progress Notes (Signed)
Subjective: Interval History: Patient offers no complaints.  Objective: Vital signs in last 24 hours: Temp:  [97.5 F (36.4 C)-97.6 F (36.4 C)] 97.5 F (36.4 C) (05/04 0513) Pulse Rate:  [75-84] 84 (05/04 0513) Resp:  [15-18] 18 (05/04 0513) BP: (124-146)/(53-62) 124/62 mmHg (05/04 0513) SpO2:  [92 %-95 %] 95 % (05/04 0513) Weight:  [59.7 kg (131 lb 9.8 oz)] 59.7 kg (131 lb 9.8 oz) (05/04 0513) Weight change: -3.8 kg (-8 lb 6 oz)  Intake/Output from previous day: 05/03 0701 - 05/04 0700 In: 0  Out: 4750 [Urine:4750] Intake/Output this shift:   Generally patient is sommolent but arousable and in no apparent distress Chest is clear to auscultation Heart examination regular rate and rhythm no murmur or S3 Extremities: She is status post bilateral BKA and no edema   Lab Results:  Recent Labs  07/15/15 0442  WBC 14.4*  HGB 9.3*  HCT 26.7*  PLT 204   BMET:   Recent Labs  07/15/15 0442 07/16/15 0630  NA 139 141  K 3.5 3.4*  CL 110 113*  CO2 19* 18*  GLUCOSE 116* 123*  BUN 64* 69*  CREATININE 4.69* 5.10*  CALCIUM 7.4* 7.7*   No results for input(s): PTH in the last 72 hours. Iron Studies: No results for input(s): IRON, TIBC, TRANSFERRIN, FERRITIN in the last 72 hours.  Studies/Results: No results found.  I have reviewed the patient's current medications.  Assessment/Plan: Problem #1 acute kidney injury superimposed on chronic. Thought to be secondary to gentamicin/ACE/vancomycin.All the above medications has been discontinued. Patient is presently nonoliguric Her renal function started declining agin. She had 4700 cc of urine out put . Problem #2 hypokalemia: Potassium has declined possibly from lasix Problem #3. Low CO2: Possibly metabolic. He remains stable  Problem #4 anemia: Her hemoglobin is low but stable.. Problem #5 history of diabetes: Her blood sugar is reasonably controlled. Problem #6 urinary tract infection patient on antibiotics: Patient is a  febrile and her white blood cell count is still high. Problem #7 history of CVA Problem #8. Metabolic bone disease : Her calcium is slightly low but her  phosphorus is range. Problem #9 hypertension: Her blood pressure is reasonably controlled Plan: 1] will D/C lasix 2] Change her ivf to 1/2 NS with 10 meq kcl at 135 cc/hr  3]Patient with sever dementia and bed ridden possibly need to be comfort only if families agreable 4] check her renal panel in the morning.   LOS: 13 days   Lillymae Duet S 07/17/2015,7:23 AM

## 2015-07-18 LAB — GLUCOSE, CAPILLARY
GLUCOSE-CAPILLARY: 227 mg/dL — AB (ref 65–99)
Glucose-Capillary: 87 mg/dL (ref 65–99)
Glucose-Capillary: 116 mg/dL — ABNORMAL HIGH (ref 65–99)
Glucose-Capillary: 118 mg/dL — ABNORMAL HIGH (ref 65–99)

## 2015-07-18 MED ORDER — SODIUM CHLORIDE 0.45 % IV SOLN
INTRAVENOUS | Status: DC
  Filled 2015-07-18 (×2): qty 1000

## 2015-07-18 MED ORDER — POTASSIUM CHLORIDE IN NACL 20-0.45 MEQ/L-% IV SOLN
INTRAVENOUS | Status: DC
  Administered 2015-07-18 – 2015-07-20 (×6): via INTRAVENOUS
  Filled 2015-07-18 (×15): qty 1000

## 2015-07-18 NOTE — Clinical Social Work Note (Signed)
CSW updated [facility] on pt. Facility can accept pt when medically stable.  Derenda FennelKara Javani Spratt, LCSW [phone]

## 2015-07-18 NOTE — Progress Notes (Signed)
Subjective: Interval History: Patient offers no complaints.  Objective: Vital signs in last 24 hours: Temp:  [97.5 F (36.4 C)-97.6 F (36.4 C)] 97.6 F (36.4 C) (05/05 0542) Pulse Rate:  [59-67] 67 (05/05 0542) Resp:  [16-18] 18 (05/05 0542) BP: (141-142)/(50-52) 142/50 mmHg (05/05 0542) SpO2:  [98 %-99 %] 99 % (05/05 0542) Weight:  [57.2 kg (126 lb 1.7 oz)] 57.2 kg (126 lb 1.7 oz) (05/05 0542) Weight change: -2.5 kg (-5 lb 8.2 oz)  Intake/Output from previous day: 05/04 0701 - 05/05 0700 In: 0  Out: 4500 [Urine:4500] Intake/Output this shift:   Generally patient is sommolent but arousable and in no apparent distress Chest is clear to auscultation Heart examination regular rate and rhythm no murmur or S3 Extremities: She is status post bilateral BKA and no edema   Lab Results: No results for input(s): WBC, HGB, HCT, PLT in the last 72 hours. BMET:   Recent Labs  07/16/15 0630 07/17/15 0811  NA 141 145  K 3.4* 2.7*  CL 113* 115*  CO2 18* 18*  GLUCOSE 123* 117*  BUN 69* 64*  CREATININE 5.10* 5.14*  CALCIUM 7.7* 7.6*   No results for input(s): PTH in the last 72 hours. Iron Studies: No results for input(s): IRON, TIBC, TRANSFERRIN, FERRITIN in the last 72 hours.  Studies/Results: No results found.  I have reviewed the patient's current medications.  Assessment/Plan: Problem #1 acute kidney injury superimposed on chronic. Thought to be secondary to gentamicin/ACE/vancomycin.All the above medications has been discontinued. Patient is presently nonoliguric Her renal function is stable possibly superimposed prerenal. Patient is of diuretics and remains polyuric. She had 4500 cc of urine out put . Problem #2 hypokalemia: Potassium has declined further and patient is on potassium supplement Problem #3. Low CO2: Possibly metabolic. He remains stable  Problem #4 anemia: Her hemoglobin is low but stable.. Problem #5 history of diabetes: Her blood sugar is reasonably  controlled. Problem #6 urinary tract infection patient on antibiotics: Patient is a febrile and her white blood cell count is still high. Problem #7 history of CVA Problem #8. Metabolic bone disease : Her calcium is slightly low but her  phosphorus is range. Problem #9 hypertension: Her blood pressure is reasonably controlled Plan: 1] will change ivf to to 1/2 ns with kcl 20 me at 145 cc/hr 2] check her renal panel in the morning.   LOS: 14 days   Marguerite Jarboe S 07/18/2015,7:17 AM

## 2015-07-18 NOTE — Progress Notes (Signed)
PROGRESS NOTE    Sara GuarneriHelen W [name]  VWU:[id number] DOB: 28-Aug-1933 DOA: 07/04/2015 PCP: Dwana MelenaZack Hall, MD     Brief Narrative:  82 year old woman admitted to the hospital on 4/21 due to acute renal failure which time her creatinine was noted to be around 5. She had been on gentamicin for an Escherichia coli UTI/decubitus ulcer. Her antibiotics have since been discontinued and she was started on IV hydration. Nephrology is involved. She has not had significant recovery of renal function as of yet; will need to discuss with nephrology ultimate goals and treatment plan. Palliative care consultation was requested on 5/4.   Assessment & Plan:   Principal Problem:   Acute renal failure (HCC) Active Problems:   Essential hypertension   PERIPHERAL VASCULAR DISEASE   Dehydration   h/o Stroke   CAD (coronary artery disease)   DM (diabetes mellitus), type 2, uncontrolled, periph vascular complic (HCC)   Dementia   Metabolic acidosis   Lactic acidosis   Buttock wound   Pressure ulcer   Palliative care encounter    Acute renal failure.   Likely due to prerenal azotemia/intrinsic kidney damage in the setting of dehydration and ACE inhibitor/gentamycin.   Nephrology was consulted and continues to follow  Patient is continued on IVF, Lasix has been discontinued by nephrology. Management as per nephrology.  Continue to monitor urine output  Renal ultrasound without obstructive process.  Gram positive cocci bacteremia, 1/2 cultures positive for coagulase negative staph and enterococcus.   This culture was drawn from PICC line.   On 07/08/2015, Dr. Kerry HoughMemon had discussed with Dr. Anne HahnSynder with ID who recommended removing the PICC line, discontinuing antibiotics and then repeating blood cultures on 4/27  Repeat blood cultures neg thus far x 2  Not currently on antibiotics.  Hypokalemia  Continue to replace.  Recheck in am.  Hypomagnesemia  Replaced; last magnesium was 2.1 on  5/2.  Metabolic acidosis.  Due to renal failure/lactic acidosis.   Continue IVFs.   Lactic acidosis.  Resolved  Continue IVFs.   Holding metformin.  Dehydration.  Clinically improved with IV fluid hydration  Essential HTN.  Patient is continued on norvasc.   Continued hydralazine prn.   Continue to hold ace-inhibitor per above  DM type 2 with peripheral vascular complications.   Continue SSI.   Well-controlled.  Stage III Decubitus ulcer on buttock, present on admisison.  Does not appear to be actively infected.   Continue wound care.  Skilled Nursing Agency requested follow up wound culture. Wound culture is without growth  Dementia.  Stable.  Patient seems more alert  Wide complex tachycardia, nonsustained  Possibly secondary to recent hypokalemia, hypomagnesemia, metabolic acidosis related to renal insufficiency  Electrolytes have been normalized  Amiodarone was added to metoprolol on 4/29  On 4/30, patient noted to be bradycardic with heart rate down to the mid 30s, asymptomatic. Subsequently amiodarone was discontinued l. Discussed case with cardiology who agreed with plan. 2-D echocardiogram performed shows an intact ejection fraction of 65-70% without wall motion abnormalities and not technically sufficient to evaluate diastolic function.  Heart rate has normalized  Patient has tolerated low-dose metoprolol overnight. Will increase to 25 mg twice a day as tolerated.   DVT prophylaxis: Subcutaneous heparin Code Status: Full code Family Communication: Patient only [facility]  Consultants:   Nephrology, Dr. Fausto SkillernBefakadu  Procedures:   None  Antimicrobials:   None    Subjective:  Lying  in bed, not very conversant, somnolent  Objective: Filed Vitals:   07/17/15 0513 07/17/15 1537 07/18/15 0542 07/18/15 1300  BP: 124/62 141/52 142/50 111/48  Pulse: 84  59 67 64  Temp: 97.5 F (36.4 C) 97.5 F (36.4 C) 97.6 F (36.4 C) 97.5 F (36.4 C)  TempSrc: Oral Oral Oral Oral  Resp: 18 16 18 20   Height:      Weight: 59.7 kg (131 lb 9.8 oz)  57.2 kg (126 lb 1.7 oz)   SpO2: 95% 98% 99% 100%    Intake/Output Summary (Last 24 hours) at 07/18/15 1621 Last data filed at 07/18/15 1300  Gross per 24 hour  Intake    360 ml  Output   4500 ml  Net  -4140 ml   Filed Weights   07/16/15 0608 07/17/15 0513 07/18/15 0542  Weight: 63.5 kg (139 lb 15.9 oz) 59.7 kg (131 lb 9.8 oz) 57.2 kg (126 lb 1.7 oz)    Examination:   General exam: Drowsy, not conversant  Respiratory system: Clear to auscultation. Respiratory effort normal. Cardiovascular system:RRR. No murmurs, rubs, gallops. Gastrointestinal system: Abdomen is nondistended, soft and nontender. No organomegaly or masses felt. Normal bowel sounds heard. Central nervous system:  Unable to fully assess given drowsiness Extremities:  Bilateral below-knee amputations Skin: No rashes, lesions or ulcers Psychiatry:  Unable to fully assess given drowsiness    Data Reviewed: I have personally reviewed following labs and imaging studies  CBC:  Recent Labs Lab 07/14/15 0443 07/15/15 0442  WBC 14.5* 14.4*  HGB 9.4* 9.3*  HCT 26.6* 26.7*  MCV 83.1 83.7  PLT 199 204   Basic Metabolic Panel:  Recent Labs Lab 07/11/15 2150 07/12/15 0636 07/13/15 0625 07/14/15 0443 07/15/15 0442 07/16/15 0630 07/17/15 0811  NA 133* 133* 131* 135 139 141 145  K 3.8 4.2 4.8 4.2 3.5 3.4* 2.7*  CL 99* 100* 101 105 110 113* 115*  CO2 20* 20* 21* 20* 19* 18* 18*  GLUCOSE 112* 124* 134* 116* 116* 123* 117*  BUN 79* 79* 72* 72* 64* 69* 64*  CREATININE 5.82* 5.52* 5.07* 4.91* 4.69* 5.10* 5.14*  CALCIUM 6.6* 6.8* 7.0* 7.2* 7.4* 7.7* 7.6*  MG 1.9 1.7 2.2  --   --   --   --   PHOS  --  4.1  --  3.4 4.0  --   --    GFR: Estimated Creatinine Clearance: 8 mL/min (by C-G formula based on Cr of 5.14). Liver  Function Tests:  Recent Labs Lab 07/12/15 0636 07/14/15 0443 07/15/15 0442  ALBUMIN 2.0* 2.0* 2.1*   No results for input(s): LIPASE, AMYLASE in the last 168 hours. No results for input(s): AMMONIA in the last 168 hours. Coagulation Profile: No results for input(s): INR, PROTIME in the last 168 hours. Cardiac Enzymes: No results for input(s): CKTOTAL, CKMB, CKMBINDEX, TROPONINI in the last 168 hours. BNP (last 3 results) No results for input(s): PROBNP in the last 8760 hours. HbA1C: No results for input(s): HGBA1C in the last 72 hours. CBG:  Recent Labs Lab 07/17/15 1632 07/17/15 2116 07/18/15 0738 07/18/15 1136 07/18/15 1603  GLUCAP 94 116* 87 116* 227*   Lipid Profile: No results for input(s): CHOL, HDL, LDLCALC, TRIG, CHOLHDL, LDLDIRECT in the last 72 hours. Thyroid Function Tests: No results for input(s): TSH, T4TOTAL, FREET4, T3FREE, THYROIDAB in the last 72 hours. Anemia Panel: No results for input(s): VITAMINB12, FOLATE, FERRITIN, TIBC, IRON, RETICCTPCT in the last 72 hours. Urine analysis:    Component  Value Date/Time   COLORURINE YELLOW 07/04/2015 1945   APPEARANCEUR CLOUDY* 07/04/2015 1945   LABSPEC 1.025 07/04/2015 1945   PHURINE 5.5 07/04/2015 1945   GLUCOSEU 500* 07/04/2015 1945   HGBUR MODERATE* 07/04/2015 1945   BILIRUBINUR NEGATIVE 07/04/2015 1945   KETONESUR TRACE* 07/04/2015 1945   PROTEINUR 100* 07/04/2015 1945   UROBILINOGEN 0.2 01/11/2014 2247   NITRITE NEGATIVE 07/04/2015 1945   LEUKOCYTESUR MODERATE* 07/04/2015 1945   Sepsis Labs: (procalcitonin:4,lacticidven:4)  ) Recent Results (from the past 240 hour(s))  Culture, blood (routine x 2)     Status: None   Collection Time: 07/10/15  2:26 PM  Result Value Ref Range Status   Specimen Description BLOOD LEFT ANTECUBITAL  Final   Special Requests BOTTLES DRAWN AEROBIC AND ANAEROBIC 6CC  Final   Culture NO GROWTH 5 DAYS  Final   Report Status 07/15/2015 FINAL  Final  Culture,  blood (routine x 2)     Status: None   Collection Time: 07/10/15  4:46 PM  Result Value Ref Range Status   Specimen Description BLOOD PICC LINE DRAWN BY RN  Final   Special Requests BOTTLES DRAWN AEROBIC AND ANAEROBIC 10CC EACH  Final   Culture NO GROWTH 5 DAYS  Final   Report Status 07/15/2015 FINAL  Final  Wound culture     Status: None   Collection Time: 07/10/15  5:52 PM  Result Value Ref Range Status   Specimen Description DECUBITIS  Final   Special Requests NONE  Final   Gram Stain   Final    NO WBC SEEN NO SQUAMOUS EPITHELIAL CELLS SEEN NO ORGANISMS SEEN Performed at [facility]    Culture   Final    NO GROWTH 2 DAYS Performed at [facility]    Report Status 07/13/2015 FINAL  Final         Radiology Studies: No results found.      Scheduled Meds: . amLODipine  5 mg Oral Daily  . aspirin EC  81 mg Oral Daily  . feeding supplement (NEPRO CARB STEADY)  237 mL Oral TID WC  . feeding supplement (NEPRO CARB STEADY)  237 mL Oral TID BM  . feeding supplement (PRO-STAT SUGAR FREE 64)  30 mL Oral BID  . heparin  5,000 Units Subcutaneous Q8H  . HYDROcodone-acetaminophen  1 tablet Oral Q12H  . insulin aspart  0-9 Units Subcutaneous TID WC  . metoprolol tartrate  25 mg Oral BID  . sodium chloride flush  3 mL Intravenous Q12H   Continuous Infusions: . 0.45 % NaCl with KCl 20 mEq / L 145 mL/hr at 07/18/15 0853     LOS: 14 days    Time spent: 25 minutes. Greater than 50% of this time was spent in direct contact with the patient coordinating care.     Chaya Jan, MD [facility] Pager [phone]  If 7PM-7AM, please contact night-coverage [email or web address] Password [email or web address] 07/18/2015, 4:21 PM

## 2015-07-19 LAB — RENAL FUNCTION PANEL
ANION GAP: 10 (ref 5–15)
Albumin: 2 g/dL — ABNORMAL LOW (ref 3.5–5.0)
BUN: 52 mg/dL — ABNORMAL HIGH (ref 6–20)
CALCIUM: 7.2 mg/dL — AB (ref 8.9–10.3)
CHLORIDE: 111 mmol/L (ref 101–111)
CO2: 18 mmol/L — AB (ref 22–32)
Creatinine, Ser: 4.17 mg/dL — ABNORMAL HIGH (ref 0.44–1.00)
GFR calc non Af Amer: 9 mL/min — ABNORMAL LOW (ref >60)
GFR, EST AFRICAN AMERICAN: 11 mL/min — AB (ref >60)
Glucose, Bld: 123 mg/dL — ABNORMAL HIGH (ref 65–99)
POTASSIUM: 2.9 mmol/L — AB (ref 3.5–5.1)
Phosphorus: 3.5 mg/dL (ref 2.5–4.6)
SODIUM: 139 mmol/L (ref 135–145)

## 2015-07-19 LAB — GLUCOSE, CAPILLARY
GLUCOSE-CAPILLARY: 121 mg/dL — AB (ref 65–99)
GLUCOSE-CAPILLARY: 133 mg/dL — AB (ref 65–99)
Glucose-Capillary: 111 mg/dL — ABNORMAL HIGH (ref 65–99)
Glucose-Capillary: 64 mg/dL — ABNORMAL LOW (ref 65–99)

## 2015-07-19 MED ORDER — GLUCOSE 40 % PO GEL
ORAL | Status: AC
  Administered 2015-07-19: 17:00:00
  Filled 2015-07-19: qty 1

## 2015-07-19 MED ORDER — POTASSIUM CHLORIDE 10 MEQ/100ML IV SOLN
10.0000 meq | INTRAVENOUS | Status: AC
  Administered 2015-07-19 (×5): 10 meq via INTRAVENOUS
  Filled 2015-07-19: qty 100

## 2015-07-19 NOTE — Progress Notes (Signed)
[name]  MRN: [id number]  DOB/AGE: 1933/05/15 82 y.o.  Primary Care Physician:Sara Margo Aye, MD  Admit date: 07/04/2015  Chief Complaint:  Chief Complaint  Patient presents with  . Abnormal Labs     S-Pt presented on  07/04/2015 with  Chief Complaint  Patient presents with  . Abnormal Labs   .   Pt remains non communicative,  Pt unable to offer any complaints.     Meds  . amLODipine  5 mg Oral Daily  . aspirin EC  81 mg Oral Daily  . feeding supplement (NEPRO CARB STEADY)  237 mL Oral TID WC  . feeding supplement (NEPRO CARB STEADY)  237 mL Oral TID BM  . feeding supplement (PRO-STAT SUGAR FREE 64)  30 mL Oral BID  . heparin  5,000 Units Subcutaneous Q8H  . HYDROcodone-acetaminophen  1 tablet Oral Q12H  . insulin aspart  0-9 Units Subcutaneous TID WC  . metoprolol tartrate  25 mg Oral BID  . sodium chloride flush  3 mL Intravenous Q12H      Physical Exam: Vital signs in last 24 hours: Temp:  [97.6 F (36.4 C)-98.1 F (36.7 C)] 97.6 F (36.4 C) (05/06 1544) Pulse Rate:  [57-72] 72 (05/06 1544) Resp:  [16-20] 16 (05/06 1544) BP: (122-159)/(41-62) 159/41 mmHg (05/06 1544) SpO2:  [95 %-96 %] 95 % (05/06 1544) Weight:  [125 lb 14.1 oz (57.1 kg)] 125 lb 14.1 oz (57.1 kg) (05/06 0701) Weight change: -3.5 oz (-0.1 kg) Last BM Date: 07/18/15  Intake/Output from previous day: 05/05 0701 - 05/06 0700 In: 2385 [P.O.:360; I.V.:2025] Out: 3150 [Urine:3150]     Physical Exam: General- pt does not communicate Resp- No acute REsp distress, decreased at bases. CVS- S1S2 regular in rate and rhythm, SEM + GIT- BS+, soft, NT, ND EXT- b/L BKA, trace Edema,No Cyanosis   Lab Results: HGb  10.0  BMET  Recent Labs  07/17/15 0811 07/19/15 0834  NA 145 139  K 2.7* 2.9*  CL 115* 111  CO2 18* 18*  GLUCOSE 117* 123*  BUN 64* 52*  CREATININE 5.14* 4.17*  CALCIUM 7.6* 7.2*   Creat trend 2017  6.1=>4.69=>5.1=>4.1    MICRO Recent Results (from the past 240  hour(s))  Culture, blood (routine x 2)     Status: None   Collection Time: 07/10/15  2:26 PM  Result Value Ref Range Status   Specimen Description BLOOD LEFT ANTECUBITAL  Final   Special Requests BOTTLES DRAWN AEROBIC AND ANAEROBIC 6CC  Final   Culture NO GROWTH 5 DAYS  Final   Report Status 07/15/2015 FINAL  Final  Culture, blood (routine x 2)     Status: None   Collection Time: 07/10/15  4:46 PM  Result Value Ref Range Status   Specimen Description BLOOD PICC LINE DRAWN BY RN  Final   Special Requests BOTTLES DRAWN AEROBIC AND ANAEROBIC 10CC EACH  Final   Culture NO GROWTH 5 DAYS  Final   Report Status 07/15/2015 FINAL  Final  Wound culture     Status: None   Collection Time: 07/10/15  5:52 PM  Result Value Ref Range Status   Specimen Description DECUBITIS  Final   Special Requests NONE  Final   Gram Stain   Final    NO WBC SEEN NO SQUAMOUS EPITHELIAL CELLS SEEN NO ORGANISMS SEEN Performed at [facility]    Culture   Final    NO GROWTH 2 DAYS Performed at [facility]  Report Status 07/13/2015 FINAL  Final      Lab Results  Component Value Date   CALCIUM 7.2* 07/19/2015   PHOS 3.5 07/19/2015   Alb 2.0 Corrected calcium 6.5+1.6=8.1     Impression: 1)Renal  AKI secondary to ATN               AKI sec to Gentamycin /ACE               Non oliguric ATN               AKI now improving               Creat trending down.              Will follow up ion bmet.    2)HTN  Medication- On Diuretics- On Calcium Channel Blockers On Beta blockers    3)Anemia HGb stable  4)Hypocalcemia Phosphorus at goal. After correcting for low albumin calcium is still low but stable  5)ID-Blood cultures positive for Coagulase negative staph and enterococcus PICC line now d/ced Repeat cultures negative    6)Electrolytes Hypokalemic   Being replte  Hyponatremic   Now better  7)Acid base Co2 not at goal But stable    Plan:  Will reduce IVF  rate Will follow bmet    [name] S 07/19/2015, 7:57 PM

## 2015-07-19 NOTE — Progress Notes (Signed)
Family in room with patient. Family was able to get patient to take medications crushed and mixed with banana.

## 2015-07-19 NOTE — Progress Notes (Signed)
PROGRESS NOTE    [name]  ZOX:[id number] DOB: [birth year] DOA: 07/04/2015 PCP: [name], MD     Brief Narrative:  82 year old woman admitted to the hospital on 4/21 due to acute renal failure which time her creatinine was noted to be around 5. She had been on gentamicin for an Escherichia coli UTI/decubitus ulcer. Her antibiotics have since been discontinued and she was started on IV hydration. Nephrology is involved. Renal failure starting to mildly improve, we'll need to further discuss goals of care with family as I believe she is more appropriate for comfort care at this point.  Assessment & Plan:   Principal Problem:   Acute renal failure (HCC) Active Problems:   Essential hypertension   PERIPHERAL VASCULAR DISEASE   Dehydration   h/o Stroke   CAD (coronary artery disease)   DM (diabetes mellitus), type 2, uncontrolled, periph vascular complic (HCC)   Dementia   Metabolic acidosis   Lactic acidosis   Buttock wound   Pressure ulcer   Palliative care encounter    Acute renal failure.   Likely due to prerenal azotemia/intrinsic kidney damage in the setting of dehydration and ACE inhibitor/gentamycin.   Nephrology was consulted and continues to follow  Patient is continued on IVF, Lasix has been discontinued by nephrology. Management as per nephrology.  Continue to monitor urine output  Renal ultrasound without obstructive process.  Gram positive cocci bacteremia, 1/2 cultures positive for coagulase negative staph and enterococcus.   This culture was drawn from PICC line.   On 07/08/2015, Dr. Kerry Hough had discussed with Dr. Anne Hahn with ID who recommended removing the PICC line, discontinuing antibiotics and then repeating blood cultures on 4/27  Repeat blood cultures neg thus far x 2  Not currently on antibiotics.  Hypokalemia  Continue to replace.  Recheck in am.  Hypomagnesemia  Replaced; last magnesium was 2.1 on 5/2.  Metabolic  acidosis.  Due to renal failure/lactic acidosis.   Continue IVFs.   Lactic acidosis.  Resolved  Continue IVFs.   Holding metformin.  Dehydration.  Clinically improved with IV fluid hydration  Essential HTN.  Patient is continued on norvasc.   Continued hydralazine prn.   Continue to hold ace-inhibitor per above  DM type 2 with peripheral vascular complications.   Continue SSI.   Well-controlled.  Stage III Decubitus ulcer on buttock, present on admisison.  Does not appear to be actively infected.   Continue wound care.  [facility] requested follow up wound culture. Wound culture is without growth  Dementia.  Stable.  Patient seems more alert  Wide complex tachycardia, nonsustained  Possibly secondary to recent hypokalemia, hypomagnesemia, metabolic acidosis related to renal insufficiency  Electrolytes have been normalized  Amiodarone was added to metoprolol on 4/29  On 4/30, patient noted to be bradycardic with heart rate down to the mid 30s, asymptomatic. Subsequently amiodarone was discontinued l. Discussed case with cardiology who agreed with plan. 2-D echocardiogram performed shows an intact ejection fraction of 65-70% without wall motion abnormalities and not technically sufficient to evaluate diastolic function.  Heart rate has normalized  Patient has tolerated low-dose metoprolol overnight. Will increase to 25 mg twice a day as tolerated.   DVT prophylaxis: Subcutaneous heparin Code Status: Full code Family Communication: Patient only Disposition Plan: Hopefully back to   [facility] soon as plan discussed with nephrology. Awaiting family call back to discuss possibility of transitioning patient over to comfort care measures only.  Consultants:   Nephrology, Dr. Fausto Skillern  Procedures:  None  Antimicrobials:   None    Subjective:  Lying in bed, not very conversant, somnolent  Objective: Filed Vitals:   07/18/15  1300 07/18/15 2156 07/19/15 0701 07/19/15 1544  BP: 111/48 130/43 122/62 159/41  Pulse: 64 57 60 72  Temp: 97.5 F (36.4 C) 97.8 F (36.6 C) 98.1 F (36.7 C) 97.6 F (36.4 C)  TempSrc: Oral Oral Oral Axillary  Resp: 20 20 16 16   Height:      Weight:   57.1 kg (125 lb 14.1 oz)   SpO2: 100% 96% 95% 95%    Intake/Output Summary (Last 24 hours) at 07/19/15 1557 Last data filed at 07/19/15 1540  Gross per 24 hour  Intake 3630.92 ml  Output   3250 ml  Net 380.92 ml   Filed Weights   07/17/15 0513 07/18/15 0542 07/19/15 0701  Weight: 59.7 kg (131 lb 9.8 oz) 57.2 kg (126 lb 1.7 oz) 57.1 kg (125 lb 14.1 oz)    Examination:   General exam: Drowsy, not conversant  Respiratory system: Clear to auscultation. Respiratory effort normal. Cardiovascular system:RRR. No murmurs, rubs, gallops. Gastrointestinal system: Abdomen is nondistended, soft and nontender. No organomegaly or masses felt. Normal bowel sounds heard. Central nervous system:  Unable to fully assess given drowsiness Extremities:  Bilateral below-knee amputations Skin: No rashes, lesions or ulcers Psychiatry:  Unable to fully assess given drowsiness    Data Reviewed: I have personally reviewed following labs and imaging studies  CBC:  Recent Labs Lab 07/14/15 0443 07/15/15 0442  WBC 14.5* 14.4*  HGB 9.4* 9.3*  HCT 26.6* 26.7*  MCV 83.1 83.7  PLT 199 204   Basic Metabolic Panel:  Recent Labs Lab 07/13/15 0625 07/14/15 0443 07/15/15 0442 07/16/15 0630 07/17/15 0811 07/19/15 0834  NA 131* 135 139 141 145 139  K 4.8 4.2 3.5 3.4* 2.7* 2.9*  CL 101 105 110 113* 115* 111  CO2 21* 20* 19* 18* 18* 18*  GLUCOSE 134* 116* 116* 123* 117* 123*  BUN 72* 72* 64* 69* 64* 52*  CREATININE 5.07* 4.91* 4.69* 5.10* 5.14* 4.17*  CALCIUM 7.0* 7.2* 7.4* 7.7* 7.6* 7.2*  MG 2.2  --   --   --   --   --   PHOS  --  3.4 4.0  --   --  3.5   GFR: Estimated Creatinine Clearance: 9.8 mL/min (by C-G formula based on Cr of  4.17). Liver Function Tests:  Recent Labs Lab 07/14/15 0443 07/15/15 0442 07/19/15 0834  ALBUMIN 2.0* 2.1* 2.0*   No results for input(s): LIPASE, AMYLASE in the last 168 hours. No results for input(s): AMMONIA in the last 168 hours. Coagulation Profile: No results for input(s): INR, PROTIME in the last 168 hours. Cardiac Enzymes: No results for input(s): CKTOTAL, CKMB, CKMBINDEX, TROPONINI in the last 168 hours. BNP (last 3 results) No results for input(s): PROBNP in the last 8760 hours. HbA1C: No results for input(s): HGBA1C in the last 72 hours. CBG:  Recent Labs Lab 07/18/15 1136 07/18/15 1603 07/18/15 2155 07/19/15 0739 07/19/15 1116  GLUCAP 116* 227* 118* 111* 121*   Lipid Profile: No results for input(s): CHOL, HDL, LDLCALC, TRIG, CHOLHDL, LDLDIRECT in the last 72 hours. Thyroid Function Tests: No results for input(s): TSH, T4TOTAL, FREET4, T3FREE, THYROIDAB in the last 72 hours. Anemia Panel: No results for input(s): VITAMINB12, FOLATE, FERRITIN, TIBC, IRON, RETICCTPCT in the last 72 hours. Urine analysis:    Component Value Date/Time   COLORURINE YELLOW 07/04/2015 1945  APPEARANCEUR CLOUDY* 07/04/2015 1945   LABSPEC 1.025 07/04/2015 1945   PHURINE 5.5 07/04/2015 1945   GLUCOSEU 500* 07/04/2015 1945   HGBUR MODERATE* 07/04/2015 1945   BILIRUBINUR NEGATIVE 07/04/2015 1945   KETONESUR TRACE* 07/04/2015 1945   PROTEINUR 100* 07/04/2015 1945   UROBILINOGEN 0.2 01/11/2014 2247   NITRITE NEGATIVE 07/04/2015 1945   LEUKOCYTESUR MODERATE* 07/04/2015 1945   Sepsis Labs: @LABRCNTIP (procalcitonin:4,lacticidven:4)  ) Recent Results (from the past 240 hour(s))  Culture, blood (routine x 2)     Status: None   Collection Time: 07/10/15  2:26 PM  Result Value Ref Range Status   Specimen Description BLOOD LEFT ANTECUBITAL  Final   Special Requests BOTTLES DRAWN AEROBIC AND ANAEROBIC 6CC  Final   Culture NO GROWTH 5 DAYS  Final   Report Status 07/15/2015 FINAL   Final  Culture, blood (routine x 2)     Status: None   Collection Time: 07/10/15  4:46 PM  Result Value Ref Range Status   Specimen Description BLOOD PICC LINE DRAWN BY RN  Final   Special Requests BOTTLES DRAWN AEROBIC AND ANAEROBIC 10CC EACH  Final   Culture NO GROWTH 5 DAYS  Final   Report Status 07/15/2015 FINAL  Final  Wound culture     Status: None   Collection Time: 07/10/15  5:52 PM  Result Value Ref Range Status   Specimen Description DECUBITIS  Final   Special Requests NONE  Final   Gram Stain   Final    NO WBC SEEN NO SQUAMOUS EPITHELIAL CELLS SEEN NO ORGANISMS SEEN [facility]formed [facility]    Culture   Final    NO GROWTH 2 DAYS Performed at [facility]    Report Status 07/13/2015 FINAL  Final         Radiology Studies: No results found.      Scheduled Meds: . amLODipine  5 mg Oral Daily  . aspirin EC  81 mg Oral Daily  . feeding supplement (NEPRO CARB STEADY)  237 mL Oral TID WC  . feeding supplement (NEPRO CARB STEADY)  237 mL Oral TID BM  . feeding supplement (PRO-STAT SUGAR FREE 64)  30 mL Oral BID  . heparin  5,000 Units Subcutaneous Q8H  . HYDROcodone-acetaminophen  1 tablet Oral Q12H  . insulin aspart  0-9 Units Subcutaneous TID WC  . metoprolol tartrate  25 mg Oral BID  . potassium chloride  10 mEq Intravenous Q1 Hr x 5  . sodium chloride flush  3 mL Intravenous Q12H   Continuous Infusions: . 0.45 % NaCl with KCl 20 mEq / L 145 mL/hr at 07/19/15 1418     LOS: 15 days    Time spent: 15 minutes. Greater than 50% of this time was spent in direct contact with the patient coordinating care.     Chaya JanHERNANDEZ ACOSTA,ESTELA, MD [facility] Pager [phone]  If 7PM-7AM, please contact night-coverage [email or web address] Password [email or web address] 07/19/2015, 3:57 PM

## 2015-07-19 NOTE — Progress Notes (Signed)
Pt is refusing to take medications or eat breakfast. RN was able to get pt to take a few sips of prostate and nepro.  Will try to get pt to take medications again at a later time. Lesly Dukesachel J Everett, RN

## 2015-07-20 LAB — BASIC METABOLIC PANEL
Anion gap: 9 (ref 5–15)
BUN: 43 mg/dL — AB (ref 6–20)
CO2: 18 mmol/L — ABNORMAL LOW (ref 22–32)
CREATININE: 3.33 mg/dL — AB (ref 0.44–1.00)
Calcium: 7 mg/dL — ABNORMAL LOW (ref 8.9–10.3)
Chloride: 107 mmol/L (ref 101–111)
GFR calc Af Amer: 14 mL/min — ABNORMAL LOW (ref >60)
GFR, EST NON AFRICAN AMERICAN: 12 mL/min — AB (ref >60)
Glucose, Bld: 106 mg/dL — ABNORMAL HIGH (ref 65–99)
POTASSIUM: 3.5 mmol/L (ref 3.5–5.1)
SODIUM: 134 mmol/L — AB (ref 135–145)

## 2015-07-20 LAB — GLUCOSE, CAPILLARY
GLUCOSE-CAPILLARY: 103 mg/dL — AB (ref 65–99)
Glucose-Capillary: 197 mg/dL — ABNORMAL HIGH (ref 65–99)
Glucose-Capillary: 98 mg/dL (ref 65–99)
Glucose-Capillary: 123 mg/dL — ABNORMAL HIGH (ref 65–99)

## 2015-07-20 MED ORDER — PANTOPRAZOLE SODIUM 40 MG PO TBEC
40.0000 mg | DELAYED_RELEASE_TABLET | Freq: Every day | ORAL | Status: DC
  Administered 2015-07-21: 40 mg via ORAL
  Filled 2015-07-20: qty 1

## 2015-07-20 MED ORDER — PANTOPRAZOLE SODIUM 40 MG IV SOLR
40.0000 mg | INTRAVENOUS | Status: DC
  Administered 2015-07-20: 40 mg via INTRAVENOUS
  Filled 2015-07-20: qty 40

## 2015-07-20 NOTE — Plan of Care (Signed)
Problem: Skin Integrity: Goal: Risk for impaired skin integrity will decrease Outcome: Progressing Wound Care

## 2015-07-20 NOTE — Progress Notes (Signed)
Pt's Daughter stated pt was having reflux and was rubbing her throat. Daughter said she takes something for reflux at home. RN notified MD. Verbal order from MD IV 40 mg Protonix once a day. RN will continue to monitor. Lesly Dukesachel J Everett, RN

## 2015-07-20 NOTE — Progress Notes (Signed)
PROGRESS NOTE    Sara GuarneriHelen W [name]  ZOX:[id number] DOB: Jan 29, 1934 DOA: 07/04/2015 PCP: Dwana MelenaZack Hall, MD     Brief Narrative:  82 year old woman admitted to the hospital on 4/21 due to acute renal failure which time her creatinine was noted to be around 5. She had been on gentamicin for an Escherichia coli UTI/decubitus ulcer. Her antibiotics have since been discontinued and she was started on IV hydration. Nephrology is involved. Renal failure starting to improve, we'll need to further discuss goals of care with family as I believe she is more appropriate for comfort care at this point.  Assessment & Plan:   Principal Problem:   Acute renal failure (HCC) Active Problems:   Essential hypertension   PERIPHERAL VASCULAR DISEASE   Dehydration   h/o Stroke   CAD (coronary artery disease)   DM (diabetes mellitus), type 2, uncontrolled, periph vascular complic (HCC)   Dementia   Metabolic acidosis   Lactic acidosis   Buttock wound   Pressure ulcer   Palliative care encounter    Acute renal failure.   Likely due to prerenal azotemia/intrinsic kidney damage in the setting of dehydration and ACE inhibitor/gentamycin.   Nephrology was consulted and continues to follow  Patient is continued on IVF, Lasix has been discontinued by nephrology. Management as per nephrology.  Continue to monitor urine output  Renal ultrasound without obstructive process.  Creatinine is down to 3.33 on 5/7.  Gram positive cocci bacteremia, 1/2 cultures positive for coagulase negative staph and enterococcus.   This culture was drawn from PICC line.   On 07/08/2015, Dr. Kerry HoughMemon had discussed with Dr. Anne HahnSynder with ID who recommended removing the PICC line, discontinuing antibiotics and then repeating blood cultures on 4/27  Repeat blood cultures neg thus far x 2  Not currently on antibiotics.  Hypokalemia  Replaced.  Recheck in am.  Hypomagnesemia  Replaced; last magnesium was 2.1 on  5/2.  Metabolic acidosis.  Due to renal failure/lactic acidosis.   Continue IVFs.   Lactic acidosis.  Resolved  Continue IVFs.   Holding metformin.  Dehydration.  Clinically improved with IV fluid hydration  Essential HTN.  Patient is continued on norvasc.   Continued hydralazine prn.   Continue to hold ace-inhibitor per above  DM type 2 with peripheral vascular complications.   Continue SSI.   Well-controlled.  Stage III Decubitus ulcer on buttock, present on admisison.  Does not appear to be actively infected.   Continue wound care.  [facility]illed Nursing Agency requested follow up wound culture. Wound culture is without growth  Dementia.  Stable.  Patient seems more alert  Wide complex tachycardia, nonsustained  Possibly secondary to recent hypokalemia, hypomagnesemia, metabolic acidosis related to renal insufficiency  Electrolytes have been normalized  Amiodarone was added to metoprolol on 4/29  On 4/30, patient noted to be bradycardic with heart rate down to the mid 30s, asymptomatic. Subsequently amiodarone was discontinued l. Discussed case with cardiology who agreed with plan. 2-D echocardiogram performed shows an intact ejection fraction of 65-70% without wall motion abnormalities and not technically sufficient to evaluate diastolic function.  Heart rate has normalized  Patient has tolerated low-dose metoprolol overnight. Has been increased to 25 mg twice a day as tolerated.   DVT prophylaxis: Subcutaneous heparin Code Status: Full code Family Communication: Patient only Disposition Plan: Discussed with daughter [name] & son Casimiro NeedleMichael at bedside on 5/6 regarding goals of care. I do not think they will agree to pursue comfort measures. They still seem to want  her to be aggressively treated. With her renal function improving, she may be ready to go back to SNF over the next 24-48 hours.  Consultants:   Nephrology, Dr.  Fausto Skillern  Procedures:   None  Antimicrobials:   None    Subjective:  Lying in bed, not very conversant, somnolent  Objective: Filed Vitals:   07/19/15 0701 07/19/15 1544 07/19/15 2211 07/20/15 0652  BP: 122/62 159/41 122/53 128/61  Pulse: 60 72 69 74  Temp: 98.1 F (36.7 C) 97.6 F (36.4 C) 97.5 F (36.4 C) 98.2 F (36.8 C)  TempSrc: Oral Axillary Axillary Axillary  Resp: Height:      Weight: 57.1 kg (125 lb 14.1 oz)     SpO2: 95% 95% 96% 95%    Intake/Output Summary (Last 24 hours) at 07/20/15 1223 Last data filed at 07/20/15 0659  Gross per 24 hour  Intake 2841.5 ml  Output   3700 ml  Net -858.5 ml   Filed Weights   07/17/15 0513 07/18/15 0542 07/19/15 0701  Weight: 59.7 kg (131 lb 9.8 oz) 57.2 kg (126 lb 1.7 oz) 57.1 kg (125 lb 14.1 oz)    Examination:   General exam: Drowsy, not conversant  Respiratory system: Clear to auscultation. Respiratory effort normal. Cardiovascular system:RRR. No murmurs, rubs, gallops. Gastrointestinal system: Abdomen is nondistended, soft and nontender. No organomegaly or masses felt. Normal bowel sounds heard. Central nervous system:  Unable to fully assess given drowsiness Extremities:  Bilateral below-knee amputations Skin: No rashes, lesions or ulcers Psychiatry:  Unable to fully assess given drowsiness    Data Reviewed: I have personally reviewed following labs and imaging studies  CBC:  Recent Labs Lab 07/14/15 0443 07/15/15 0442  WBC 14.5* 14.4*  HGB 9.4* 9.3*  HCT 26.6* 26.7*  MCV 83.1 83.7  PLT 199 204   Basic Metabolic Panel:  Recent Labs Lab 07/14/15 0443 07/15/15 0442 07/16/15 0630 07/17/15 0811 07/19/15 0834 07/20/15 0551  NA 135 139 141 145 139 134*  K 4.2 3.5 3.4* 2.7* 2.9* 3.5  CL 105 110 113* 115* 111 107  CO2 20* 19* 18* 18* 18* 18*  GLUCOSE 116* 116* 123* 117* 123* 106*  BUN 72* 64* 69* 64* 52* 43*  CREATININE 4.91* 4.69* 5.10* 5.14* 4.17* 3.33*  CALCIUM 7.2* 7.4*  7.7* 7.6* 7.2* 7.0*  PHOS 3.4 4.0  --   --  3.5  --    GFR: Estimated Creatinine Clearance: 12.3 mL/min (by C-G formula based on Cr of 3.33). Liver Function Tests:  Recent Labs Lab 07/14/15 0443 07/15/15 0442 07/19/15 0834  ALBUMIN 2.0* 2.1* 2.0*   No results for input(s): LIPASE, AMYLASE in the last 168 hours. No results for input(s): AMMONIA in the last 168 hours. Coagulation Profile: No results for input(s): INR, PROTIME in the last 168 hours. Cardiac Enzymes: No results for input(s): CKTOTAL, CKMB, CKMBINDEX, TROPONINI in the last 168 hours. BNP (last 3 results) No results for input(s): PROBNP in the last 8760 hours. HbA1C: No results for input(s): HGBA1C in the last 72 hours. CBG:  Recent Labs Lab 07/19/15 1116 07/19/15 1648 07/19/15 2210 07/20/15 0742 07/20/15 1116  GLUCAP 121* 64* 133* 103* 197*   Lipid Profile: No results for input(s): CHOL, HDL, LDLCALC, TRIG, CHOLHDL, LDLDIRECT in the last 72 hours. Thyroid Function Tests: No results for input(s): TSH, T4TOTAL, FREET4, T3FREE, THYROIDAB in the last 72 hours. Anemia Panel: No results for input(s): VITAMINB12, FOLATE, FERRITIN, TIBC, IRON, RETICCTPCT in the last  72 hours. Urine analysis:    Component Value Date/Time   COLORURINE YELLOW 07/04/2015 1945   APPEARANCEUR CLOUDY* 07/04/2015 1945   LABSPEC 1.025 07/04/2015 1945   PHURINE 5.5 07/04/2015 1945   GLUCOSEU 500* 07/04/2015 1945   HGBUR MODERATE* 07/04/2015 1945   BILIRUBINUR NEGATIVE 07/04/2015 1945   KETONESUR TRACE* 07/04/2015 1945   PROTEINUR 100* 07/04/2015 1945   UROBILINOGEN 0.2 01/11/2014 2247   NITRITE NEGATIVE 07/04/2015 1945   LEUKOCYTESUR MODERATE* 07/04/2015 1945   Sepsis Labs: @LABRCNTIP (procalcitonin:4,lacticidven:4)  ) Recent Results (from the past 240 hour(s))  Culture, blood (routine x 2)     Status: None   Collection Time: 07/10/15  2:26 PM  Result Value Ref Range Status   Specimen Description BLOOD LEFT ANTECUBITAL  Final    Special Requests BOTTLES DRAWN AEROBIC AND ANAEROBIC 6CC  Final   Culture NO GROWTH 5 DAYS  Final   Report Status 07/15/2015 FINAL  Final  Culture, blood (routine x 2)     Status: None   Collection Time: 07/10/15  4:46 PM  Result Value Ref Range Status   Specimen Description BLOOD PICC LINE DRAWN BY RN  Final   Special Requests BOTTLES DRAWN AEROBIC AND ANAEROBIC 10CC EACH  Final   Culture NO GROWTH 5 DAYS  Final   Report Status 07/15/2015 FINAL  Final  Wound culture     Status: None   Collection Time: 07/10/15  5:52 PM  Result Value Ref Range Status   Specimen Description DECUBITIS  Final   Special Requests NONE  Final   Gram Stain   Final    NO WBC SEEN NO SQUAMOUS EPITHELIAL CELLS SEEN NO ORGANISMS SEEN Performed at [facility]    Culture   Final    NO GROWTH 2 DAYS Performed at [facility]    Report Status 07/13/2015 FINAL  Final         Radiology Studies: No results found.      Scheduled Meds: . amLODipine  5 mg Oral Daily  . aspirin EC  81 mg Oral Daily  . feeding supplement (NEPRO CARB STEADY)  237 mL Oral TID WC  . feeding supplement (NEPRO CARB STEADY)  237 mL Oral TID BM  . feeding supplement (PRO-STAT SUGAR FREE 64)  30 mL Oral BID  . heparin  5,000 Units Subcutaneous Q8H  . HYDROcodone-acetaminophen  1 tablet Oral Q12H  . insulin aspart  0-9 Units Subcutaneous TID WC  . metoprolol tartrate  25 mg Oral BID  . pantoprazole (PROTONIX) IV  40 mg Intravenous Q24H  . sodium chloride flush  3 mL Intravenous Q12H   Continuous Infusions: . 0.45 % NaCl with KCl 20 mEq / L 125 mL/hr at 07/20/15 0615     LOS: 16 days    Time spent: 25 minutes. Greater than 50% of this time was spent in direct contact with the patient coordinating care.     Chaya Jan, MD [facility] Pager [phone]  If 7PM-7AM, please contact night-coverage [email or web address] Password [email or web address] 07/20/2015, 12:23 PM

## 2015-07-20 NOTE — Progress Notes (Signed)
Sara GuarneriHelen W [name]  MRN: [id number]  DOB/AGE: 04-14-1933 82 y.o.  Primary Care Physician:Zack Margo AyeHall, MD  Admit date: 07/04/2015  Chief Complaint:  Chief Complaint  Patient presents with  . Abnormal Labs     S-Pt presented on  07/04/2015 with  Chief Complaint  Patient presents with  . Abnormal Labs   . Pt does not to offer any complaints.     Meds  . amLODipine  5 mg Oral Daily  . aspirin EC  81 mg Oral Daily  . feeding supplement (NEPRO CARB STEADY)  237 mL Oral TID WC  . feeding supplement (NEPRO CARB STEADY)  237 mL Oral TID BM  . feeding supplement (PRO-STAT SUGAR FREE 64)  30 mL Oral BID  . heparin  5,000 Units Subcutaneous Q8H  . HYDROcodone-acetaminophen  1 tablet Oral Q12H  . insulin aspart  0-9 Units Subcutaneous TID WC  . metoprolol tartrate  25 mg Oral BID  . [START ON 07/21/2015] pantoprazole  40 mg Oral Daily  . sodium chloride flush  3 mL Intravenous Q12H      Physical Exam: Vital signs in last 24 hours: Temp:  [97.5 F (36.4 C)-98.2 F (36.8 C)] 98.1 F (36.7 C) (05/07 1612) Pulse Rate:  [61-74] 61 (05/07 1612) Resp:  [20] 20 (05/07 1612) BP: (122-131)/(53-65) 131/65 mmHg (05/07 1612) SpO2:  [95 %-96 %] 96 % (05/07 1612) Weight change:  Last BM Date: 07/18/15  Intake/Output from previous day: 05/06 0701 - 05/07 0700 In: 3833.3 [I.V.:3433.3; IV Piggyback:400] Out: 4650 [Urine:4650]     Physical Exam: General- pt laying comfortably Resp- No acute REsp distress, decreased at bases. CVS- S1S2 regular in rate and rhythm, SEM + GIT- BS+, soft, NT, ND EXT- b/L BKA, trace Edema,No Cyanosis   Lab Results: HGb  10.0  BMET  Recent Labs  07/19/15 0834 07/20/15 0551  NA 139 134*  K 2.9* 3.5  CL 111 107  CO2 18* 18*  GLUCOSE 123* 106*  BUN 52* 43*  CREATININE 4.17* 3.33*  CALCIUM 7.2* 7.0*   Creat trend 2017  6.1=>4.69=>5.1=>4.1=>3.3    MICRO No results found for this or any previous visit (from the past 240 hour(s)).    Lab Results   Component Value Date   CALCIUM 7.0* 07/20/2015   PHOS 3.5 07/19/2015   Alb 2.0 Corrected calcium 6.5+1.6=8.1     Impression: 1)Renal  AKI secondary to ATN               AKI sec to Gentamycin /ACE               Non oliguric ATN               AKI now improving               Creat trending down.              Will follow up ion bmet.    2)HTN  Medication- On Diuretics- On Calcium Channel Blockers On Beta blockers    3)Anemia HGb stable  4)Hypocalcemia Phosphorus at goal. After correcting for low albumin calcium is still low but stable  5)ID-Blood cultures positive for Coagulase negative staph and enterococcus PICC line now d/ced Repeat cultures negative    6)Electrolytes Hypokalemic  better  Hyponatremic   stable  7)Acid base Co2 not at goal But stable    Plan:  Will continue current care Will follow bmet    Senya Hinzman S 07/20/2015, 7:11 PM

## 2015-07-20 NOTE — Progress Notes (Signed)
Key Points: Use following P&T approved IV to PO antibiotic change policy.  Description contains the criteria that are approved Note: Policy Excludes:  Esophagectomy patientsPHARMACIST - PHYSICIAN COMMUNICATION DR:    CONCERNING: IV to Oral Route Change Policy  RECOMMENDATION: This patient is receiving Protonix by the intravenous route.  Based on criteria approved by the Pharmacy and Therapeutics Committee, the intravenous medication(s) is/are being converted to the equivalent oral dose form(s).   DESCRIPTION: These criteria include:  The patient is eating (either orally or via tube) and/or has been taking other orally administered medications for a least 24 hours  The patient has no evidence of active gastrointestinal bleeding or impaired GI absorption (gastrectomy, short bowel, patient on TNA or NPO).  If you have questions about this conversion, please contact the Pharmacy Department  [x]   [phone]( [phone] )  [facility] []   [phone]( [phone] )  [facility] []   [phone]( [phone] )  [facility] []   [phone]( [phone] )  [facility] []   [phone]( [phone] )  [facility]   FairlandPittman, Coletta Lockner DarienBennett, [facility] FranciscoRPH 07/20/2015 1:19 PM

## 2015-07-20 NOTE — Progress Notes (Signed)
Pt is not eating meals or nutritional supplements. Pt had one episode of vomiting. Zofran given.

## 2015-07-21 LAB — BASIC METABOLIC PANEL
ANION GAP: 11 (ref 5–15)
BUN: 39 mg/dL — ABNORMAL HIGH (ref 6–20)
CALCIUM: 7.2 mg/dL — AB (ref 8.9–10.3)
CO2: 17 mmol/L — AB (ref 22–32)
Chloride: 102 mmol/L (ref 101–111)
Creatinine, Ser: 2.88 mg/dL — ABNORMAL HIGH (ref 0.44–1.00)
GFR calc Af Amer: 17 mL/min — ABNORMAL LOW (ref >60)
GFR calc non Af Amer: 15 mL/min — ABNORMAL LOW (ref >60)
GLUCOSE: 123 mg/dL — AB (ref 65–99)
POTASSIUM: 3.4 mmol/L — AB (ref 3.5–5.1)
Sodium: 130 mmol/L — ABNORMAL LOW (ref 135–145)

## 2015-07-21 LAB — GLUCOSE, CAPILLARY
GLUCOSE-CAPILLARY: 129 mg/dL — AB (ref 65–99)
Glucose-Capillary: 118 mg/dL — ABNORMAL HIGH (ref 65–99)

## 2015-07-21 MED ORDER — AMLODIPINE BESYLATE 5 MG PO TABS: 5.0000 mg | ORAL_TABLET | Freq: Every day | ORAL | Status: AC

## 2015-07-21 MED ORDER — HYDROCODONE-ACETAMINOPHEN 5-325 MG PO TABS: 1.0000 | ORAL_TABLET | Freq: Two times a day (BID) | ORAL | Status: AC

## 2015-07-21 MED ORDER — METOPROLOL TARTRATE 25 MG PO TABS: 25.0000 mg | ORAL_TABLET | Freq: Two times a day (BID) | ORAL | Status: AC

## 2015-07-21 NOTE — Care Management Note (Signed)
Case Management Note  Patient Details  Name: Sara GuarneriHelen W [name] MRN: [id number] Date of Birth: [birth year]  Subjective/Objective:                    Action/Plan: Patient labs improved will discharge to [facility] today. CSW following.   Expected Discharge Date:                  Expected Discharge Plan:  Skilled Nursing Facility  In-House Referral:  Clinical Social Work  Discharge planning Services  CM Consult  Post Acute Care Choice:  NA Choice offered to:  NA  DME Arranged:    DME Agency:     HH Arranged:    HH Agency:     Status of Service:  Completed, signed off  Medicare Important Message Given:  Yes Date Medicare IM Given:    Medicare IM give by:    Date Additional Medicare IM Given:    Additional Medicare Important Message give by:     If discussed at Long Length of Stay Meetings, dates discussed:    Additional Comments:  Sara HugueninBerkhead, Sara Cush L, RN 07/21/2015, 12:58 PM

## 2015-07-21 NOTE — Progress Notes (Signed)
Subjective: Interval History: Patient offers no complaints.  Objective: Vital signs in last 24 hours: Temp:  [98 F (36.7 C)-98.1 F (36.7 C)] 98 F (36.7 C) (05/08 0706) Pulse Rate:  [60-64] 64 (05/08 0706) Resp:  [20] 20 (05/08 0706) BP: (130-132)/(56-66) 130/66 mmHg (05/08 0706) SpO2:  [96 %-100 %] 100 % (05/08 0706) Weight:  [54.2 kg (119 lb 7.8 oz)] 54.2 kg (119 lb 7.8 oz) (05/08 0706) Weight change:   Intake/Output from previous day: 05/07 0701 - 05/08 0700 In: 1333.3 [I.V.:1333.3] Out: 2850 [Urine:2850] Intake/Output this shift: Total I/O In: -  Out: 1200 [Urine:1200] Generally patient is sommolent but arousable and in no apparent distress Chest is clear to auscultation Heart examination regular rate and rhythm no murmur or S3 Extremities: She is status post bilateral BKA and no edema   Lab Results: No results for input(s): WBC, HGB, HCT, PLT in the last 72 hours. BMET:   Recent Labs  07/19/15 0834 07/20/15 0551  NA 139 134*  K 2.9* 3.5  CL 111 107  CO2 18* 18*  GLUCOSE 123* 106*  BUN 52* 43*  CREATININE 4.17* 3.33*  CALCIUM 7.2* 7.0*   No results for input(s): PTH in the last 72 hours. Iron Studies: No results for input(s): IRON, TIBC, TRANSFERRIN, FERRITIN in the last 72 hours.  Studies/Results: No results found.  I have reviewed the patient's current medications.  Assessment/Plan: Problem #1 acute kidney injury superimposed on chronic. Thought to be secondary to gentamicin/ACE/vancomycin.All the above medications has been discontinued. Patient is presently nonoliguric Her renal function is Progressively improving. Presently her creatinine is above her baseline and patient is non-oliguric. Problem #2 hypokalemia: Potassium has corrected. Presently patient is on potassium supplement. Problem #3. Low CO2: Possibly metabolic. He remains stable  Problem #4 anemia: Her hemoglobin is low Problem #5 history of diabetes: Her blood sugar is reasonably  controlled. Problem #6 urinary tract infection patient on antibiotics: Patient remains afebrile  Problem #7 history of CVA Problem #8. Metabolic bone disease : Her calcium is slightly low and declining. Problem #9 hypertension: Her blood pressure is reasonably controlled Plan: 1] will continue with present treatment. 2] check her renal panel and CBC in the morning.   LOS: 17 days   Jahlon Baines S 07/21/2015,8:04 AM

## 2015-07-21 NOTE — Discharge Summary (Signed)
Physician Discharge Summary  Sara GuarneriHelen W [name] [id number] DOB: November 22, 1933 DOA: 07/04/2015  PCP: Dwana MelenaZack Hall, MD  Admit date: 07/04/2015 Discharge date: 07/21/2015  Time spent: 45 minutes  Recommendations for Outpatient Follow-up:  -Will be discharged back to SNF today. -Please follow renal function closely.   Discharge Diagnoses:  Principal Problem:   Acute renal failure (HCC) Active Problems:   Essential hypertension   PERIPHERAL VASCULAR DISEASE   Dehydration   h/o Stroke   CAD (coronary artery disease)   DM (diabetes mellitus), type 2, uncontrolled, periph vascular complic (HCC)   Dementia   Metabolic acidosis   Lactic acidosis   Buttock wound   Pressure ulcer   Palliative care encounter   Discharge Condition: Stable and improved  Filed Weights   07/18/15 0542 07/19/15 0701 07/21/15 0706  Weight: 57.2 kg (126 lb 1.7 oz) 57.1 kg (125 lb 14.1 oz) 54.2 kg (119 lb 7.8 oz)    History of present illness:  As per Dr. Onalee Huaavid on 4/21: 82 yo female h/o dementia, chronic indwelling foley, decub ulcer, htn, CVA, PVD s/p bilateral amputation recently started on gentamycin for UTI with E. Coli. All history obtained from ED staff as pt cannot provide history due to dementia. It was noted her renal function was abnormal yesterday with cr bumped up to 5, no known previous reported so the gent was stopped and pt was started on ivf yesterday. Today repeat labs showed worsening renal failure so sent to ED. Pt does not eat or drink well. Her urine in her foley has been cloudy. No report of fever. Pt very dehydrated, and referred for admission for sepsis/uti/ARF.  Hospital Course:    Acute renal failure.   Likely due to prerenal azotemia/intrinsic kidney damage in the setting of dehydration and ACE inhibitor/gentamycin.   Nephrology was consulted and continues to follow  Lasix has been discontinued by nephrology. Management as per nephrology.  Continue to monitor urine  output  Renal ultrasound without obstructive process.  Creatinine is down to 2.88 on DC.  Gram positive cocci bacteremia, 1/2 cultures positive for coagulase negative staph and enterococcus.   This culture was drawn from PICC line.   On 07/08/2015, Dr. Kerry HoughMemon had discussed with Dr. Anne HahnSynder with ID who recommended removing the PICC line, discontinuing antibiotics and then repeating blood cultures on 4/27  Repeat blood cultures neg thus far x 2  Not currently on antibiotics.  Hypokalemia  Replaced.  Recheck in am.  Hypomagnesemia  Replaced; last magnesium was 2.1 on 5/2.  Metabolic acidosis.  Due to renal failure/lactic acidosis.    Lactic acidosis.  Resolved  Holding metformin.  Dehydration.  Clinically improved with IV fluid hydration  Essential HTN.  Patient is continued on norvasc.   Continued hydralazine prn.   Continue to hold ace-inhibitor per above  DM type 2 with peripheral vascular complications.   Continue SSI.   Well-controlled.  Stage III Decubitus ulcer on buttock, present on admisison.  Does not appear to be actively infected.   Continue wound care.  Skilled Nursing Agency requested follow up wound culture. Wound culture is without growth  Dementia.  Stable.  Patient seems more alert  Wide complex tachycardia, nonsustained  Possibly secondary to recent hypokalemia, hypomagnesemia, metabolic acidosis related to renal insufficiency  Electrolytes have been normalized  Amiodarone was added to metoprolol on 4/29  On 4/30, patient noted to be bradycardic with heart rate down to the mid 30s, asymptomatic. Subsequently amiodarone was discontinued l. Discussed case with  cardiology who agreed with plan. 2-D echocardiogram performed shows an intact ejection fraction of 65-70% without wall motion abnormalities and not technically sufficient to evaluate diastolic function.  Heart rate has normalized  Patient has tolerated  low-dose metoprolol overnight. Has been increased to 25 mg twice a day as tolerated.    Procedures:  None   Consultations:  Nephrology  Discharge Instructions  Discharge Instructions    Increase activity slowly    Complete by:  As directed             Medication List    STOP taking these medications        GENTAMICIN SULFATE IJ     heparin NICU/SCN flush 1 UNIT/ML Soln     lisinopril 20 MG tablet  Commonly known as:  PRINIVIL,ZESTRIL     metFORMIN 500 MG 24 hr tablet  Commonly known as:  GLUCOPHAGE-XR     sitaGLIPtin 100 MG tablet  Commonly known as:  JANUVIA     sodium chloride 0.45 %     trimethoprim 100 MG tablet  Commonly known as:  TRIMPEX      TAKE these medications        amLODipine 5 MG tablet  Commonly known as:  NORVASC  Take 1 tablet (5 mg total) by mouth daily.     aspirin EC 81 MG tablet  Take 81 mg by mouth daily.     cholecalciferol 1000 units tablet  Commonly known as:  VITAMIN D  Take 2,000 Units by mouth daily.     Cranberry 450 MG Caps  Take 450 mg by mouth daily.     feeding supplement (PRO-STAT SUGAR FREE 64) Liqd  Take 30 mLs by mouth 2 (two) times daily.     HYDROcodone-acetaminophen 5-325 MG tablet  Commonly known as:  NORCO/VICODIN  Take 1 tablet by mouth every 12 (twelve) hours.     insulin aspart 100 UNIT/ML injection  Commonly known as:  novoLOG  Inject 2-5 Units into the skin 4 (four) times daily - after meals and at bedtime. Per sliding. 200-250= 2 units >70 call MD; 251-300 = 3 units; 301-350 = 4 units; 351-400 = 5 units; <400 call MD.     lovastatin 40 MG tablet  Commonly known as:  MEVACOR  Take 60 mg by mouth at bedtime.     metoprolol tartrate 25 MG tablet  Commonly known as:  LOPRESSOR  Take 1 tablet (25 mg total) by mouth 2 (two) times daily.     omeprazole 20 MG capsule  Commonly known as:  PRILOSEC  Take 20 mg by mouth daily.     SENNA PLUS 8.6-50 MG tablet  Generic drug:  senna-docusate  Take  1 tablet by mouth 2 (two) times daily.       Allergies  Allergen Reactions  . Penicillins     REACTION: unknown reaction: MAR reported       Follow-up Information    Follow up with [name], MD.   Specialty:  Internal Medicine   Contact information:   [address] [place] [address] [phone]        The results of significant diagnostics from this hospitalization (including imaging, microbiology, ancillary and laboratory) are listed below for reference.    Significant Diagnostic Studies: US Renal  07/06/2015  CLINICAL DATA:  Acute kidney injury, UTI EXAM: RENAL / URINARY TRACT ULTRASOUND COMPLETE COMPARISON:  CT abdomen pelvis dated 12/18/2009 FINDINGS: Right Kidney: Length: 11.1 cm. Echogenic renal parenchyma, suggesting  medical renal disease. No hydronephrosis. Left Kidney: Length: 10.9 cm. Echogenic renal parenchyma, suggesting medical renal disease. No hydronephrosis. Bladder: Decompressed by indwelling Foley catheter. IMPRESSION: Echogenic renal parenchyma, suggesting medical renal disease. No hydronephrosis. Bladder decompressed by indwelling Foley catheter. Electronically Signed   By: Charline Bills M.D.   On: 07/06/2015 11:30   Dg Chest Port 1 View  07/04/2015  CLINICAL DATA:  PICC line placement.  Acute renal failure. EXAM: PORTABLE CHEST 1 VIEW COMPARISON:  04/21/2013 FINDINGS: Right PICC line tip:  SVC.  No pneumothorax. Borderline enlargement of the cardiopericardial silhouette with atherosclerotic aortic arch. The lungs appear clear. IMPRESSION: 1. Right PICC line tip:  SVC.  No complicating feature. 2. Borderline enlargement of the cardiopericardial silhouette. 3. Atherosclerotic aortic arch. Electronically Signed   By: Gaylyn Rong M.D.   On: 07/04/2015 18:52    Microbiology: No results found for this or any previous visit (from the past 240 hour(s)).   Labs: Basic Metabolic Panel:  Recent Labs Lab 07/15/15 0442 07/16/15 0630  07/17/15 0811 07/19/15 0834 07/20/15 0551 07/21/15 0805  NA 139 141 145 139 134* 130*  K 3.5 3.4* 2.7* 2.9* 3.5 3.4*  CL 110 113* 115* 111 107 102  CO2 19* 18* 18* 18* 18* 17*  GLUCOSE 116* 123* 117* 123* 106* 123*  BUN 64* 69* 64* 52* 43* 39*  CREATININE 4.69* 5.10* 5.14* 4.17* 3.33* 2.88*  CALCIUM 7.4* 7.7* 7.6* 7.2* 7.0* 7.2*  PHOS 4.0  --   --  3.5  --   --    Liver Function Tests:  Recent Labs Lab 07/15/15 0442 07/19/15 0834  ALBUMIN 2.1* 2.0*   No results for input(s): LIPASE, AMYLASE in the last 168 hours. No results for input(s): AMMONIA in the last 168 hours. CBC:  Recent Labs Lab 07/15/15 0442  WBC 14.4*  HGB 9.3*  HCT 26.7*  MCV 83.7  PLT 204   Cardiac Enzymes: No results for input(s): CKTOTAL, CKMB, CKMBINDEX, TROPONINI in the last 168 hours. BNP: BNP (last 3 results) No results for input(s): BNP in the last 8760 hours.  ProBNP (last 3 results) No results for input(s): PROBNP in the last 8760 hours.  CBG:  Recent Labs Lab 07/20/15 1116 07/20/15 1655 07/20/15 2204 07/21/15 0752 07/21/15 1107  GLUCAP 197* 98 123* 118* 129*       Signed:  HERNANDEZ ACOSTA,ESTELA  [facility] Pager: [phone] 07/21/2015, 12:33 PM

## 2015-07-21 NOTE — Clinical Social Work Note (Signed)
Pt d/c today back to [facility]. Pt's daughter, [name] and facility aware and agreeable. Will transfer via [facility].   Derenda FennelKara Yasuo Phimmasone, LCSW [phone]

## 2015-07-21 NOTE — Care Management Important Message (Signed)
Important Message  Patient Details  Name: Sara GuarneriHelen W [name] MRN: [id number] Date of Birth: Dec 05, 1933   Medicare Important Message Given:  Yes    Adonis HugueninBerkhead, Martine Trageser L, RN 07/21/2015, 12:57 PM

## 2015-07-21 NOTE — Progress Notes (Signed)
Daily Progress Note   Patient Name: [name]       Date: 07/21/2015 DOB: 1933/11/21  Age: 82 y.o. MRN#: [id number] Attending Physician: Memory Argue* Primary Care Physician: [name], MD Admit Date: 07/04/2015  Reason for Consultation/Follow-up: Establishing goals of care and Psychosocial/spiritual support  Subjective: [name] is resting quietly in bed as I enter she opens her eyes and makes brief eye contact with me. She is unable to communicate in any meaningful way.  There is no family of bedside at this time.  Call to daughter Kortne All. We talk about her mothers current and chronic health concerns. We talk briefly about the dementia trajectory, poor PO intake and dehydration, recurrent hospitalizations likely, and dementia being a terminal illness.  [name] shares that she is a Child psychotherapist, and is aware of the chronic illness trajectory for dementia. We talk about her mother having very poor PO intake, and likely to continue to have episodes of dehydration and kidney problems. [name] shares that she spoke with nephrologist about dialysis, and was told that she may need treatment for a short period of time "to [place] the kidneys". We talk about [name]'s frailty and that she is not a good candidate for long-term hemodialysis. I share my worry over her quality of life and dialysis.    We talk about advanced directives and the MOST form, I encourage [name] to think about the choices for her mother when she returns to [facility].  I share that she will be expected to complete the MOST form.   [name] states that she would like life support for her mother, if needed for short period of time, if it would not be too much for her mother. I share that, in reality, chest compressions  would break [name]'s ribs. I share my concern that she would not survive, and that her last days would be filled with pain. [name] states, "she is very frail".  I encourage [name] to call me at any time if she has questions or concerns about her mothers treatment plan.  Length of Stay: 17  Current Medications: Scheduled Meds:  . amLODipine  5 mg Oral Daily  . aspirin EC  81 mg Oral Daily  . feeding supplement (NEPRO CARB STEADY)  237 mL Oral TID WC  .  feeding supplement (NEPRO CARB STEADY)  237 mL Oral TID BM  . feeding supplement (PRO-STAT SUGAR FREE 64)  30 mL Oral BID  . heparin  5,000 Units Subcutaneous Q8H  . HYDROcodone-acetaminophen  1 tablet Oral Q12H  . insulin aspart  0-9 Units Subcutaneous TID WC  . metoprolol tartrate  25 mg Oral BID  . pantoprazole  40 mg Oral Daily  . sodium chloride flush  3 mL Intravenous Q12H    Continuous Infusions: . 0.45 % NaCl with KCl 20 mEq / L 125 mL/hr at 07/20/15 1423    PRN Meds: hydrALAZINE, ondansetron **OR** ondansetron (ZOFRAN) IV, prochlorperazine  Physical Exam  Constitutional: No distress.  HENT:  Head: Normocephalic and atraumatic.  Pulmonary/Chest: Effort normal. No respiratory distress.  Abdominal: Soft.  Neurological: She is alert.  Unable to determine orientation  Skin: Skin is warm and dry.  Nursing note and vitals reviewed.           Vital Signs: BP 130/66 mmHg  Pulse 64  Temp(Src) 98 F (36.7 C) (Axillary)  Resp 20  Ht 5\' 5"  (1.651 m)  Wt 54.2 kg (119 lb 7.8 oz)  BMI 19.88 kg/m2  SpO2 100% SpO2: SpO2: 100 % O2 Device: O2 Device: Not Delivered O2 Flow Rate:    Intake/output summary:  Intake/Output Summary (Last 24 hours) at 07/21/15 1320 Last data filed at 07/21/15 1253  Gross per 24 hour  Intake 1573.33 ml  Output   4050 ml  Net -2476.67 ml   LBM: Last BM Date: 07/18/15 Baseline Weight: Weight: 45.36 kg (100 lb) Most recent weight: Weight: 54.2 kg (119 lb 7.8 oz) (only had one 1 pillow foley  empty tele off one sheet 1 cover)       Palliative Assessment/Data:    Flowsheet Rows        Most Recent Value   Intake Tab    Referral Department  Hospitalist   Unit at Time of Referral  Med/Surg Unit   Palliative Care Primary Diagnosis  Sepsis/Infectious Disease   Date Notified  07/17/15   Palliative Care Type  New Palliative care   Reason for referral  Clarify Goals of Care   Date of Admission  07/04/15   Date first seen by Palliative Care  07/17/15   # of days Palliative referral response time  0 Day(s)   # of days IP prior to Palliative referral  13   Clinical Assessment    Palliative Performance Scale Score  20%   Pain Max last 24 hours  Not able to report   Pain Min Last 24 hours  Not able to report   Dyspnea Max Last 24 Hours  Not able to report   Dyspnea Min Last 24 hours  Not able to report   Psychosocial & Spiritual Assessment    Palliative Care Outcomes    Patient/Family meeting held?  No   Palliative Care Outcomes  Provided psychosocial or spiritual support   Palliative Care follow-up planned  -- [Follow up at [facility]]      Patient Active Problem List   Diagnosis Date Noted  . Palliative care encounter   . Pressure ulcer 07/09/2015  . Metabolic acidosis 07/04/2015  . Lactic acidosis 07/04/2015  . Dementia   . Buttock wound   . Encephalopathy, metabolic Toxic 09/24/2013  . UTI (urinary tract infection) 09/21/2013  . Hypothermia 09/21/2013  . DM (diabetes mellitus), type 2, uncontrolled, periph vascular complic (HCC) 04/22/2013  . Acute renal failure (HCC) 04/21/2013  .  Dehydration 04/21/2013  . Hyperosmolar non-ketotic state in patient with type 2 diabetes mellitus (HCC) 04/21/2013  . CAD (coronary artery disease) 04/21/2013  . Hyperglycemia 04/21/2013  . Dysphagia 04/21/2013  . h/o Stroke   . GERD (gastroesophageal reflux disease) 03/18/2011  . GASTROPARESIS 10/27/2009  . HYPERLIPIDEMIA 11/05/2008  . DEPRESSION 11/05/2008  . Essential hypertension  11/05/2008  . PERIPHERAL VASCULAR DISEASE 11/05/2008  . COPD 11/05/2008  . GI BLEEDING 11/05/2008  . NAUSEA AND VOMITING 11/05/2008    Palliative Care Assessment & Plan   Patient Profile: [name] is a 82 year old female resident of [facility] custodial care, with a history of dementia, CVA, PVD with bilateral lower extremity amputation, chronic indwelling Foley catheter, recurrent UTIs, admitted April 21 for worsening renal function/UTI.  Assessment: as above  Recommendations/Plan:  return to [facility] residential SNF, treat the treatable.  Goals of Care and Additional Recommendations:  Limitations on Scope of Treatment: Treat the treatable at this time, continue with full code status at this time.  Code Status:    Code Status Orders        Start     Ordered   07/04/15 2247  Full code   Continuous     07/04/15 2246    Code Status History    Date Active Date Inactive Code Status Order ID Comments User Context   09/21/2013  4:58 PM 09/24/2013  2:30 PM Full Code [id number]  Wilson Singer, MD ED   04/21/2013 11:12 PM 04/24/2013  9:02 PM Full Code [id number]  Haydee Monica, MD Inpatient       Prognosis:   < 6 months, likely due to advanced dementia, poor PO intake, poor kidney function.  Discharge Planning:  Skilled Nursing Facility for rehab with Palliative care service follow-up  Care plan was discussed with Nursing staff, case manager, social worker, and Dr. Viann Fish.  Thank you for allowing the Palliative Medicine Team to assist in the care of this patient.   Time In: 1230 Time Out: 1310 Total Time 40 minutes Prolonged Time Billed  no       Greater than 50%  of this time was spent counseling and coordinating care related to the above assessment and plan.  Xavier Fournier A, NP  Please contact Palliative Medicine Team phone at [phone] for questions and concerns.

## 2015-07-21 NOTE — NC FL2 (Signed)
[place] MEDICAID FL2 LEVEL OF CARE SCREENING TOOL     IDENTIFICATION  Patient Name: [name] Birthdate: 31-Aug-1933 Sex: female Admission Date (Current Location): 07/04/2015  [place]:  [place] [id number] O Facility and Address:  [facility],  [address], [place] [id number]      Provider Number: [id number]  Attending Physician Name and Address:  Micael HampshireEstela Y Hernandez Acost*  Relative Name and Phone Number:       Current Level of Care: Hospital Recommended Level of Care: Skilled Nursing Facility Prior Approval Number:    Date Approved/Denied:   PASRR Number: [id number]  Discharge Plan: SNF    Current Diagnoses: Patient Active Problem List   Diagnosis Date Noted  . Palliative care encounter   . Pressure ulcer 07/09/2015  . Metabolic acidosis 07/04/2015  . Lactic acidosis 07/04/2015  . Dementia   . Buttock wound   . Encephalopathy, metabolic Toxic 09/24/2013  . UTI (urinary tract infection) 09/21/2013  . Hypothermia 09/21/2013  . DM (diabetes mellitus), type 2, uncontrolled, periph vascular complic (HCC) 04/22/2013  . Acute renal failure (HCC) 04/21/2013  . Dehydration 04/21/2013  . Hyperosmolar non-ketotic state in patient with type 2 diabetes mellitus (HCC) 04/21/2013  . CAD (coronary artery disease) 04/21/2013  . Hyperglycemia 04/21/2013  . Dysphagia 04/21/2013  . h/o Stroke   . GERD (gastroesophageal reflux disease) 03/18/2011  . GASTROPARESIS 10/27/2009  . HYPERLIPIDEMIA 11/05/2008  . DEPRESSION 11/05/2008  . Essential hypertension 11/05/2008  . PERIPHERAL VASCULAR DISEASE 11/05/2008  . COPD 11/05/2008  . GI BLEEDING 11/05/2008  . NAUSEA AND VOMITING 11/05/2008    Orientation RESPIRATION BLADDER Height & Weight     Self  Normal Indwelling catheter Weight: 119 lb 7.8 oz (54.2 kg) (only had one 1 pillow foley empty tele off one sheet 1 cover) Height:  5\' 5"  (165.1 cm)  BEHAVIORAL SYMPTOMS/MOOD NEUROLOGICAL BOWEL  NUTRITION STATUS  Other (Comment) (n/a)  (n/a) Incontinent  (Soft diet)  AMBULATORY STATUS COMMUNICATION OF NEEDS Skin   Total Care Verbally  (Stage III to right ischial tuberosity with foam dressing. Stage 1 to sacrum with foam dressing. Redness to abdominal folds and under breasts.)                       Personal Care Assistance Level of Assistance  Total care           Functional Limitations Info  Sight, Hearing, Speech Sight Info: Adequate Hearing Info: Adequate Speech Info: Impaired    SPECIAL CARE FACTORS FREQUENCY                       Contractures      Additional Factors Info  Insulin Sliding Scale Code Status Info: Full code Allergies Info: Penicillins           Current Medications (07/21/2015):  This is the current hospital active medication list Current Facility-Administered Medications  Medication Dose Route Frequency Provider Last Rate Last Dose  . 0.45 % NaCl with KCl 20 mEq / L infusion   Intravenous Continuous Randa LynnManpreet S Bhutani, MD 125 mL/hr at 07/20/15 1423    . amLODipine (NORVASC) tablet 5 mg  5 mg Oral Daily Erick BlinksJehanzeb Memon, MD   5 mg at 07/21/15 1143  . aspirin EC tablet 81 mg  81 mg Oral Daily Haydee Monicaachal A David, MD   81 mg at 07/21/15 1143  . feeding supplement (NEPRO CARB STEADY) liquid 237 mL  237 mL  Oral TID WC Salomon Mast, MD   237 mL at 07/20/15 1700  . feeding supplement (NEPRO CARB STEADY) liquid 237 mL  237 mL Oral TID BM Jerald Kief, MD   237 mL at 07/21/15 1144  . feeding supplement (PRO-STAT SUGAR FREE 64) liquid 30 mL  30 mL Oral BID Haydee Monica, MD   30 mL at 07/21/15 1142  . heparin injection 5,000 Units  5,000 Units Subcutaneous Q8H Haydee Monica, MD   5,000 Units at 07/21/15 0640  . hydrALAZINE (APRESOLINE) injection 10 mg  10 mg Intravenous Q6H PRN Erick Blinks, MD      . HYDROcodone-acetaminophen (NORCO/VICODIN) 5-325 MG per tablet 1 tablet  1 tablet Oral Q12H Haydee Monica, MD   1 tablet at 07/21/15 1142  .  insulin aspart (novoLOG) injection 0-9 Units  0-9 Units Subcutaneous TID WC Haydee Monica, MD   2 Units at 07/20/15 1141  . metoprolol tartrate (LOPRESSOR) tablet 25 mg  25 mg Oral BID Henderson Cloud, MD   25 mg at 07/21/15 1144  . ondansetron (ZOFRAN) tablet 4 mg  4 mg Oral Q6H PRN Haydee Monica, MD       Or  . ondansetron [facility]) injection 4 mg  4 mg Intravenous Q6H PRN Haydee Monica, MD   4 mg at 07/20/15 1352  . pantoprazole (PROTONIX) EC tablet 40 mg  40 mg Oral Daily Henderson Cloud, MD   40 mg at 07/21/15 1143  . prochlorperazine (COMPAZINE) injection 10 mg  10 mg Intravenous Q6H PRN Erick Blinks, MD   10 mg at 07/06/15 1525  . sodium chloride flush (NS) 0.9 % injection 3 mL  3 mL Intravenous Q12H Haydee Monica, MD   3 mL at 07/20/15 2358     Discharge Medications: Please see discharge summary for a list of discharge medications.  Relevant Imaging Results:  Relevant Lab Results:   Additional Information SS#: [id number]  [place], [place] [phone]

## 2015-07-25 ENCOUNTER — Inpatient Hospital Stay (HOSPITAL_COMMUNITY)
Admission: EM | Admit: 2015-07-25 | Discharge: 2015-07-30 | DRG: 698 | Disposition: A | Discharge status: Skilled Nursing Facility | Payer: Medicare Other | Attending: Internal Medicine | Admitting: Internal Medicine

## 2015-07-25 ENCOUNTER — Emergency Department (HOSPITAL_COMMUNITY): Payer: Medicare Other

## 2015-07-25 ENCOUNTER — Encounter (HOSPITAL_COMMUNITY): Payer: Self-pay | Admitting: Emergency Medicine

## 2015-07-25 DIAGNOSIS — B377 Candidal sepsis: Secondary | ICD-10-CM | POA: Diagnosis not present

## 2015-07-25 DIAGNOSIS — K219 Gastro-esophageal reflux disease without esophagitis: Secondary | ICD-10-CM | POA: Diagnosis present

## 2015-07-25 DIAGNOSIS — Z89511 Acquired absence of right leg below knee: Secondary | ICD-10-CM | POA: Diagnosis not present

## 2015-07-25 DIAGNOSIS — T83511A Infection and inflammatory reaction due to indwelling urethral catheter, initial encounter: Principal | ICD-10-CM | POA: Diagnosis present

## 2015-07-25 DIAGNOSIS — D631 Anemia in chronic kidney disease: Secondary | ICD-10-CM | POA: Diagnosis present

## 2015-07-25 DIAGNOSIS — E1129 Type 2 diabetes mellitus with other diabetic kidney complication: Secondary | ICD-10-CM | POA: Diagnosis present

## 2015-07-25 DIAGNOSIS — L8990 Pressure ulcer of unspecified site, unspecified stage: Secondary | ICD-10-CM | POA: Diagnosis present

## 2015-07-25 DIAGNOSIS — E1165 Type 2 diabetes mellitus with hyperglycemia: Secondary | ICD-10-CM | POA: Diagnosis present

## 2015-07-25 DIAGNOSIS — N179 Acute kidney failure, unspecified: Secondary | ICD-10-CM | POA: Diagnosis not present

## 2015-07-25 DIAGNOSIS — Z66 Do not resuscitate: Secondary | ICD-10-CM | POA: Diagnosis present

## 2015-07-25 DIAGNOSIS — I739 Peripheral vascular disease, unspecified: Secondary | ICD-10-CM | POA: Diagnosis present

## 2015-07-25 DIAGNOSIS — E871 Hypo-osmolality and hyponatremia: Secondary | ICD-10-CM | POA: Diagnosis present

## 2015-07-25 DIAGNOSIS — Z89512 Acquired absence of left leg below knee: Secondary | ICD-10-CM | POA: Diagnosis not present

## 2015-07-25 DIAGNOSIS — E1151 Type 2 diabetes mellitus with diabetic peripheral angiopathy without gangrene: Secondary | ICD-10-CM | POA: Diagnosis present

## 2015-07-25 DIAGNOSIS — A4189 Other specified sepsis: Secondary | ICD-10-CM | POA: Diagnosis present

## 2015-07-25 DIAGNOSIS — Z7189 Other specified counseling: Secondary | ICD-10-CM | POA: Insufficient documentation

## 2015-07-25 DIAGNOSIS — E785 Hyperlipidemia, unspecified: Secondary | ICD-10-CM | POA: Diagnosis present

## 2015-07-25 DIAGNOSIS — I129 Hypertensive chronic kidney disease with stage 1 through stage 4 chronic kidney disease, or unspecified chronic kidney disease: Secondary | ICD-10-CM | POA: Diagnosis present

## 2015-07-25 DIAGNOSIS — Z8701 Personal history of pneumonia (recurrent): Secondary | ICD-10-CM | POA: Diagnosis not present

## 2015-07-25 DIAGNOSIS — N184 Chronic kidney disease, stage 4 (severe): Secondary | ICD-10-CM | POA: Diagnosis present

## 2015-07-25 DIAGNOSIS — Z8673 Personal history of transient ischemic attack (TIA), and cerebral infarction without residual deficits: Secondary | ICD-10-CM

## 2015-07-25 DIAGNOSIS — N390 Urinary tract infection, site not specified: Secondary | ICD-10-CM | POA: Diagnosis not present

## 2015-07-25 DIAGNOSIS — E1122 Type 2 diabetes mellitus with diabetic chronic kidney disease: Secondary | ICD-10-CM | POA: Diagnosis present

## 2015-07-25 DIAGNOSIS — Z90710 Acquired absence of both cervix and uterus: Secondary | ICD-10-CM | POA: Diagnosis not present

## 2015-07-25 DIAGNOSIS — Z7982 Long term (current) use of aspirin: Secondary | ICD-10-CM

## 2015-07-25 DIAGNOSIS — E872 Acidosis: Secondary | ICD-10-CM | POA: Diagnosis present

## 2015-07-25 DIAGNOSIS — Y846 Urinary catheterization as the cause of abnormal reaction of the patient, or of later complication, without mention of misadventure at the time of the procedure: Secondary | ICD-10-CM | POA: Diagnosis present

## 2015-07-25 DIAGNOSIS — D649 Anemia, unspecified: Secondary | ICD-10-CM | POA: Diagnosis present

## 2015-07-25 DIAGNOSIS — M810 Age-related osteoporosis without current pathological fracture: Secondary | ICD-10-CM | POA: Diagnosis present

## 2015-07-25 DIAGNOSIS — E860 Dehydration: Secondary | ICD-10-CM | POA: Diagnosis present

## 2015-07-25 DIAGNOSIS — Z8249 Family history of ischemic heart disease and other diseases of the circulatory system: Secondary | ICD-10-CM

## 2015-07-25 DIAGNOSIS — R319 Hematuria, unspecified: Secondary | ICD-10-CM

## 2015-07-25 DIAGNOSIS — N189 Chronic kidney disease, unspecified: Secondary | ICD-10-CM | POA: Diagnosis not present

## 2015-07-25 DIAGNOSIS — F0390 Unspecified dementia without behavioral disturbance: Secondary | ICD-10-CM | POA: Diagnosis present

## 2015-07-25 DIAGNOSIS — J690 Pneumonitis due to inhalation of food and vomit: Secondary | ICD-10-CM | POA: Diagnosis present

## 2015-07-25 DIAGNOSIS — B49 Unspecified mycosis: Secondary | ICD-10-CM | POA: Diagnosis not present

## 2015-07-25 DIAGNOSIS — B3749 Other urogenital candidiasis: Secondary | ICD-10-CM | POA: Diagnosis present

## 2015-07-25 DIAGNOSIS — A419 Sepsis, unspecified organism: Secondary | ICD-10-CM | POA: Diagnosis present

## 2015-07-25 DIAGNOSIS — E876 Hypokalemia: Secondary | ICD-10-CM | POA: Diagnosis present

## 2015-07-25 DIAGNOSIS — L89153 Pressure ulcer of sacral region, stage 3: Secondary | ICD-10-CM | POA: Diagnosis present

## 2015-07-25 DIAGNOSIS — L89323 Pressure ulcer of left buttock, stage 3: Secondary | ICD-10-CM | POA: Diagnosis present

## 2015-07-25 DIAGNOSIS — J189 Pneumonia, unspecified organism: Secondary | ICD-10-CM

## 2015-07-25 DIAGNOSIS — E46 Unspecified protein-calorie malnutrition: Secondary | ICD-10-CM | POA: Diagnosis present

## 2015-07-25 DIAGNOSIS — Z794 Long term (current) use of insulin: Secondary | ICD-10-CM | POA: Diagnosis not present

## 2015-07-25 DIAGNOSIS — IMO0002 Reserved for concepts with insufficient information to code with codable children: Secondary | ICD-10-CM | POA: Diagnosis present

## 2015-07-25 DIAGNOSIS — I10 Essential (primary) hypertension: Secondary | ICD-10-CM | POA: Diagnosis present

## 2015-07-25 DIAGNOSIS — R509 Fever, unspecified: Secondary | ICD-10-CM

## 2015-07-25 DIAGNOSIS — Z515 Encounter for palliative care: Secondary | ICD-10-CM | POA: Diagnosis not present

## 2015-07-25 DIAGNOSIS — T83011A Breakdown (mechanical) of indwelling urethral catheter, initial encounter: Secondary | ICD-10-CM | POA: Insufficient documentation

## 2015-07-25 HISTORY — DX: Pressure ulcer of left buttock, unspecified stage: L89.329

## 2015-07-25 HISTORY — DX: Dysphagia, unspecified: R13.10

## 2015-07-25 HISTORY — DX: Peripheral vascular disease, unspecified: I73.9

## 2015-07-25 HISTORY — DX: Retention of urine, unspecified: R33.9

## 2015-07-25 HISTORY — DX: Vitamin D deficiency, unspecified: E55.9

## 2015-07-25 HISTORY — DX: Hyperlipidemia, unspecified: E78.5

## 2015-07-25 HISTORY — DX: Gastroparesis: K31.84

## 2015-07-25 HISTORY — DX: Gastro-esophageal reflux disease without esophagitis: K21.9

## 2015-07-25 HISTORY — DX: Age-related osteoporosis without current pathological fracture: M81.0

## 2015-07-25 HISTORY — DX: Hypokalemia: E87.6

## 2015-07-25 HISTORY — DX: Chronic kidney disease, unspecified: N18.9

## 2015-07-25 HISTORY — DX: Constipation, unspecified: K59.00

## 2015-07-25 HISTORY — DX: Extended spectrum beta lactamase (ESBL) resistance: Z16.12

## 2015-07-25 HISTORY — DX: Neuromuscular dysfunction of bladder, unspecified: N31.9

## 2015-07-25 HISTORY — DX: Other disorders of plasma-protein metabolism, not elsewhere classified: E88.09

## 2015-07-25 LAB — COMPREHENSIVE METABOLIC PANEL
ALT: 9 U/L — AB (ref 14–54)
AST: 17 U/L (ref 15–41)
Albumin: 1.9 g/dL — ABNORMAL LOW (ref 3.5–5.0)
Alkaline Phosphatase: 101 U/L (ref 38–126)
Anion gap: 11 (ref 5–15)
BUN: 75 mg/dL — ABNORMAL HIGH (ref 6–20)
CHLORIDE: 105 mmol/L (ref 101–111)
CO2: 12 mmol/L — ABNORMAL LOW (ref 22–32)
CREATININE: 6.85 mg/dL — AB (ref 0.44–1.00)
Calcium: 7.4 mg/dL — ABNORMAL LOW (ref 8.9–10.3)
GFR, EST AFRICAN AMERICAN: 6 mL/min — AB (ref >60)
GFR, EST NON AFRICAN AMERICAN: 5 mL/min — AB (ref >60)
Glucose, Bld: 120 mg/dL — ABNORMAL HIGH (ref 65–99)
POTASSIUM: 4.6 mmol/L (ref 3.5–5.1)
SODIUM: 128 mmol/L — AB (ref 135–145)
Total Bilirubin: 0.5 mg/dL (ref 0.3–1.2)
Total Protein: 5.6 g/dL — ABNORMAL LOW (ref 6.5–8.1)

## 2015-07-25 LAB — I-STAT CG4 LACTIC ACID, ED: LACTIC ACID, VENOUS: 2.29 mmol/L — AB (ref 0.5–2.0)

## 2015-07-25 LAB — URINALYSIS, ROUTINE W REFLEX MICROSCOPIC
BILIRUBIN URINE: NEGATIVE
Glucose, UA: NEGATIVE mg/dL
KETONES UR: NEGATIVE mg/dL
NITRITE: NEGATIVE
PH: 6 (ref 5.0–8.0)
Protein, ur: >300 mg/dL — AB
Specific Gravity, Urine: 1.01 (ref 1.005–1.030)

## 2015-07-25 LAB — URINE MICROSCOPIC-ADD ON

## 2015-07-25 LAB — CBC WITH DIFFERENTIAL/PLATELET
Basophils Absolute: 0 10*3/uL (ref 0.0–0.1)
Basophils Relative: 0 %
EOS ABS: 0.1 10*3/uL (ref 0.0–0.7)
Eosinophils Relative: 0 %
HEMATOCRIT: 22.2 % — AB (ref 36.0–46.0)
HEMOGLOBIN: 7.9 g/dL — AB (ref 12.0–15.0)
LYMPHS ABS: 0.3 10*3/uL — AB (ref 0.7–4.0)
Lymphocytes Relative: 2 %
MCH: 29.8 pg (ref 26.0–34.0)
MCHC: 35.6 g/dL (ref 30.0–36.0)
MCV: 83.8 fL (ref 78.0–100.0)
Monocytes Absolute: 0.3 10*3/uL (ref 0.1–1.0)
Monocytes Relative: 1 %
NEUTROS ABS: 19.6 10*3/uL — AB (ref 1.7–7.7)
NEUTROS PCT: 97 %
PLATELETS: 196 10*3/uL (ref 150–400)
RBC: 2.65 MIL/uL — AB (ref 3.87–5.11)
RDW: 16.6 % — ABNORMAL HIGH (ref 11.5–15.5)
WBC: 20.3 10*3/uL — AB (ref 4.0–10.5)

## 2015-07-25 MED ORDER — AZTREONAM 2 G IJ SOLR
2.0000 g | Freq: Once | INTRAMUSCULAR | Status: AC
  Administered 2015-07-25: 2 g via INTRAVENOUS
  Filled 2015-07-25: qty 2

## 2015-07-25 MED ORDER — INSULIN ASPART 100 UNIT/ML ~~LOC~~ SOLN
0.0000 [IU] | SUBCUTANEOUS | Status: DC
  Administered 2015-07-26 – 2015-07-28 (×3): 1 [IU] via SUBCUTANEOUS

## 2015-07-25 MED ORDER — ONDANSETRON HCL 4 MG/2ML IJ SOLN: 4.0000 mg | Freq: Four times a day (QID) | INTRAMUSCULAR | Status: DC | PRN

## 2015-07-25 MED ORDER — DEXTROSE 5 % IV SOLN: 2.0000 g | Freq: Three times a day (TID) | INTRAVENOUS | Status: DC

## 2015-07-25 MED ORDER — SODIUM CHLORIDE 0.9 % IV SOLN: INTRAVENOUS | Status: DC

## 2015-07-25 MED ORDER — SODIUM CHLORIDE 0.9 % IV BOLUS (SEPSIS)
500.0000 mL | Freq: Once | INTRAVENOUS | Status: AC
  Administered 2015-07-25: 500 mL via INTRAVENOUS

## 2015-07-25 MED ORDER — SODIUM CHLORIDE 0.9 % IV SOLN
INTRAVENOUS | Status: DC
  Administered 2015-07-25: 22:00:00 via INTRAVENOUS

## 2015-07-25 MED ORDER — ACETAMINOPHEN 650 MG RE SUPP: 650.0000 mg | Freq: Four times a day (QID) | RECTAL | Status: DC | PRN

## 2015-07-25 MED ORDER — HEPARIN SODIUM (PORCINE) 5000 UNIT/ML IJ SOLN
5000.0000 [IU] | Freq: Three times a day (TID) | INTRAMUSCULAR | Status: DC
  Administered 2015-07-25 – 2015-07-30 (×15): 5000 [IU] via SUBCUTANEOUS
  Filled 2015-07-25 (×14): qty 1

## 2015-07-25 MED ORDER — DEXTROSE 5 % IV SOLN
500.0000 mg | Freq: Three times a day (TID) | INTRAVENOUS | Status: DC
  Administered 2015-07-26 – 2015-07-27 (×4): 500 mg via INTRAVENOUS
  Filled 2015-07-25 (×8): qty 0.5

## 2015-07-25 MED ORDER — ACETAMINOPHEN 325 MG PO TABS
650.0000 mg | ORAL_TABLET | Freq: Four times a day (QID) | ORAL | Status: DC | PRN
Start: 2015-07-25 — End: 2015-07-30

## 2015-07-25 MED ORDER — ACETAMINOPHEN 650 MG RE SUPP
650.0000 mg | Freq: Once | RECTAL | Status: AC
  Administered 2015-07-25: 650 mg via RECTAL
  Filled 2015-07-25: qty 1

## 2015-07-25 MED ORDER — ONDANSETRON HCL 4 MG PO TABS: 4.0000 mg | ORAL_TABLET | Freq: Four times a day (QID) | ORAL | Status: DC | PRN

## 2015-07-25 MED ORDER — VANCOMYCIN HCL IN DEXTROSE 1-5 GM/200ML-% IV SOLN
1000.0000 mg | Freq: Once | INTRAVENOUS | Status: AC
  Administered 2015-07-25: 1000 mg via INTRAVENOUS
  Filled 2015-07-25: qty 200

## 2015-07-25 MED ORDER — PERFLUTREN LIPID MICROSPHERE
1.0000 mL | INTRAVENOUS | Status: DC | PRN
Start: 2015-07-25 — End: 2015-07-25
  Filled 2015-07-25: qty 10

## 2015-07-25 NOTE — ED Notes (Signed)
Vascular team completed PICC line insertion

## 2015-07-25 NOTE — ED Notes (Signed)
ICU unable to take reports at this time.

## 2015-07-25 NOTE — H&P (Signed)
History and Physical    [name] AVW:[id number] DOB: 1933-08-01 DOA: 07/25/2015  PCP: Bernerd Limbo, MD  Patient coming from: Skilled nursing facility.  Chief Complaint: Decreased urine output and fever.  History obtained from patient's daughter and previous records. Patient has dementia.  HPI: [name] is a 82 y.o. female with medical history significant of dementia, hypertension diabetes mellitus and chronic kidney disease and anemia who was recently admitted and discharged 4 days ago for acute renal failure and at that time renal failure was attributed to prerenal causes and also patient was on Lasix ACE inhibitor and had received gentamicin and at time of discharge patient's creatinine was 2.9 was found to have decreased urine output today with fever. Patient is demented and does not contribute to the history. As per patient's daughter patient did not have any nausea vomiting diarrhea chest pain or cough. Has been eating poorly for the last many days. In the ER after patient's Foley was removed patient had a large amount of urine output. Patient also was mildly febrile with leukocytosis chest x-ray showing infiltrates and UA showing features consistent with UTI. Patient has been admitted for further management.  ED Course: PICC line was placed and IV fluids started with empiric antibiotics.  Review of Systems: As per HPI otherwise 10 point review of systems negative.    Past Medical History  Diagnosis Date  . HTN (hypertension)   . Stroke (HCC)   . Diabetes mellitus without complication (HCC)   . Embolism (HCC)   . PVD (peripheral vascular disease) (HCC)   . Dementia   . Chronic kidney disease   . Neuromuscular dysfunction of bladder   . Extended spectrum beta lactamase (ESBL) resistance   . Hyperlipidemia   . Gastroesophageal reflux disease   . Dementia   . Plasma protein disorder   . Osteoporosis   . Pressure ulcer of left buttock   . Vitamin D deficiency    . Constipation   . Hypokalemia   . Retention of urine   . Peripheral vascular disease (HCC)   . Dysphagia     Past Surgical History  Procedure Laterality Date  . Below knee leg amputation      left  . 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 Lump removed      from left breast  . S/p hysterectomy    . Cholecystectomy    . Esophagogastroduodenoscopy  09/23/08    SLF:no barrett's ring ,mass or stricture     reports that she has never smoked. She does not have any smokeless tobacco history on file. She reports that she does not drink alcohol or use illicit drugs.  Allergies  Allergen Reactions  . Penicillins     REACTION: unknown reaction: MAR reported    Family History  Problem Relation Age of Onset  . Hypertension Other     Prior to Admission medications   Medication Sig Start Date End Date Taking? Authorizing Provider  Amino Acids-Protein Hydrolys (FEEDING SUPPLEMENT, PRO-STAT SUGAR FREE 64,) LIQD Take 30 mLs by mouth 2 (two) times daily.   Yes Historical Provider, MD  amLODipine (NORVASC) 5 MG tablet Take 1 tablet (5 mg total) by mouth daily. 07/21/15  Yes Henderson Cloud, MD  aspirin EC 81 MG tablet Take 81 mg by mouth daily.   Yes Historical Provider, MD  cholecalciferol (VITAMIN D) 1000 UNITS tablet Take 2,000 Units by mouth daily.  Yes Historical Provider, MD  Cranberry 450 MG CAPS Take 450 mg by mouth daily.   Yes Historical Provider, MD  HYDROcodone-acetaminophen (NORCO/VICODIN) 5-325 MG tablet Take 1 tablet by mouth every 12 (twelve) hours. 07/21/15  Yes Estela Isaiah Blakes, MD  insulin aspart (NOVOLOG) 100 UNIT/ML injection Inject 2-5 Units into the skin 4 (four) times daily - after meals and at bedtime. Per sliding. 200-250= 2 units >70 call MD; 251-300 = 3 units; 301-350 = 4 units; 351-400 = 5 units; <400 call MD.   Yes Historical Provider, MD  lovastatin (MEVACOR) 40 MG tablet Take 60 mg by mouth at bedtime.    Yes  Historical Provider, MD  metoprolol tartrate (LOPRESSOR) 25 MG tablet Take 1 tablet (25 mg total) by mouth 2 (two) times daily. 07/21/15  Yes Henderson Cloud, MD  omeprazole (PRILOSEC) 20 MG capsule Take 20 mg by mouth daily. 05/26/12  Yes Joselyn Arrow, NP  senna-docusate (SENNA PLUS) 8.6-50 MG tablet Take 1 tablet by mouth 2 (two) times daily.   Yes Historical Provider, MD    Physical Exam: Filed Vitals:   07/25/15 1900 07/25/15 1930 07/25/15 2000 07/25/15 2030  BP: 94/67 114/75 107/54 97/60  Pulse: 99 97  94  Temp:      TempSrc:      Resp: 42 Height:      Weight:      SpO2: 98% 98%  98%      Constitutional: Not in distress. Filed Vitals:   07/25/15 1900 07/25/15 1930 07/25/15 2000 07/25/15 2030  BP: 94/67 114/75 107/54 97/60  Pulse: 99 97  94  Temp:      TempSrc:      Resp: 42 Height:      Weight:      SpO2: 98% 98%  98%   Eyes: Anicteric no pallor. ENMT: No discharge from the ears eyes nose or mouth. Neck: No mass felt. No JVD appreciated. Respiratory: No rhonchi or crepitations. Cardiovascular: S1-S2 heard. Abdomen: Soft nontender bowel sounds present. Musculoskeletal: No edema. Bilateral BKA. Skin: Ecchymotic areas and abdomen. Stage III and 4 ulcers found on the buttock and sacral area. Neurologic: Patient is confused and not oriented to name place or person. Does not follow commands. Psychiatric: Demented.   Labs on Admission: I have personally reviewed following labs and imaging studies  CBC:  Recent Labs Lab 07/25/15 1805  WBC 20.3*  NEUTROABS 19.6*  HGB 7.9*  HCT 22.2*  MCV 83.8  PLT 196   Basic Metabolic Panel:  Recent Labs Lab 07/19/15 0834 07/20/15 0551 07/21/15 0805 07/25/15 1805  NA 139 134* 130* 128*  K 2.9* 3.5 3.4* 4.6  CL 111 107 102 105  CO2 18* 18* 17* 12*  GLUCOSE 123* 106* 123* 120*  BUN 52* 43* 39* 75*  CREATININE 4.17* 3.33* 2.88* 6.85*  CALCIUM 7.2* 7.0* 7.2* 7.4*  PHOS 3.5  --   --   --     GFR: Estimated Creatinine Clearance: 5.7 mL/min (by C-G formula based on Cr of 6.85). Liver Function Tests:  Recent Labs Lab 07/19/15 0834 07/25/15 1805  AST  --  17  ALT  --  9*  ALKPHOS  --  101  BILITOT  --  0.5  PROT  --  5.6*  ALBUMIN 2.0* 1.9*   No results for input(s): LIPASE, AMYLASE in the last 168 hours. No results for input(s): AMMONIA in the last 168 hours. Coagulation Profile:  No results for input(s): INR, PROTIME in the last 168 hours. Cardiac Enzymes: No results for input(s): CKTOTAL, CKMB, CKMBINDEX, TROPONINI in the last 168 hours. BNP (last 3 results) No results for input(s): PROBNP in the last 8760 hours. HbA1C: No results for input(s): HGBA1C in the last 72 hours. CBG:  Recent Labs Lab 07/20/15 1116 07/20/15 1655 07/20/15 2204 07/21/15 0752 07/21/15 1107  GLUCAP 197* 98 123* 118* 129*   Lipid Profile: No results for input(s): CHOL, HDL, LDLCALC, TRIG, CHOLHDL, LDLDIRECT in the last 72 hours. Thyroid Function Tests: No results for input(s): TSH, T4TOTAL, FREET4, T3FREE, THYROIDAB in the last 72 hours. Anemia Panel: No results for input(s): VITAMINB12, FOLATE, FERRITIN, TIBC, IRON, RETICCTPCT in the last 72 hours. Urine analysis:    Component Value Date/Time   COLORURINE AMBER* 07/25/2015 1727   APPEARANCEUR CLOUDY* 07/25/2015 1727   LABSPEC 1.010 07/25/2015 1727   PHURINE 6.0 07/25/2015 1727   GLUCOSEU NEGATIVE 07/25/2015 1727   HGBUR LARGE* 07/25/2015 1727   BILIRUBINUR NEGATIVE 07/25/2015 1727   KETONESUR NEGATIVE 07/25/2015 1727   PROTEINUR >300* 07/25/2015 1727   UROBILINOGEN 0.2 01/11/2014 2247   NITRITE NEGATIVE 07/25/2015 1727   LEUKOCYTESUR LARGE* 07/25/2015 1727   Sepsis Labs: @LABRCNTIP (procalcitonin:4,lacticidven:4) ) Recent Results (from the past 240 hour(s))  Blood Culture (routine x 2)     Status: None (Preliminary result)   Collection Time: 07/25/15  6:05 PM  Result Value Ref Range Status   Specimen Description  RIGHT ANTECUBITAL  Final   Special Requests BOTTLES DRAWN AEROBIC ONLY 6CC  Final   Culture PENDING  Incomplete   Report Status PENDING  Incomplete  Blood Culture (routine x 2)     Status: None (Preliminary result)   Collection Time: 07/25/15  6:05 PM  Result Value Ref Range Status   Specimen Description RIGHT ANTECUBITAL  Final   Special Requests BOTTLES DRAWN AEROBIC ONLY 6CC  Final   Culture PENDING  Incomplete   Report Status PENDING  Incomplete     Radiological Exams on Admission: Dg Chest Port 1 View  07/25/2015  CLINICAL DATA:  Reason for exam: fever Pt not responding to questions. Hx HTN, DM EXAM: PORTABLE CHEST 1 VIEW COMPARISON:  07/04/2015 FINDINGS: The heart is enlarged. There are bibasilar opacities, left greater than right. The left hemidiaphragm is obscured. Suspect left pleural effusion. No pulmonary edema. IMPRESSION: 1. Cardiomegaly without pulmonary edema. 2. Bilateral lower lobe infiltrates, left greater than right. Electronically Signed   By: Norva PavlovElizabeth  Brown M.D.   On: 07/25/2015 17:38    EKG: Independently reviewed. Poor quality.  Assessment/Plan Principal Problem:   Renal failure (ARF), acute on chronic (HCC) Active Problems:   Fever   Healthcare-associated pneumonia   Type 2 diabetes mellitus with renal manifestations (HCC)   Hypertension   Chronic anemia    #1. Acute on chronic renal failure stage 3-4 - at this time suspect most likely obstructive in the patient's urine output improved after Foley catheter was removed. We will gently hydrate him closely follow intake and output metabolic panel and a wide any nephrotoxins. Check FENa. #2. Fever possibly from pneumonia and UTI - suspected aspiration for now keep patient nothing by mouth get swallow evaluation. Continue with vancomycin and aztreonam. Follow blood cultures and urine cultures. #3. Hypertension - since patient is nothing by mouth I have placed patient on scheduled low-dose of metoprolol and when  necessary IV hydralazine. #4. Stage IV and 3 decubitus ulcer on the sacral area and buttock - no active  discharge seen at this time. #5. Chronic anemia probably from chronic kidney disease - follow CBC. #6. History of hyperlipidemia on statins. #7. Recent admission after blood cultures was positive for enterococcus and repeat cultures were negative. #8. Metabolic acidosis from chronic kidney disease follow metabolic panel after hydration. #9. Lactic acidosis probably from dehydration and recheck after hydrating.  Patient had PICC line placed in the ER.   DVT prophylaxis: Heparin. Code Status: DO NOT RESUSCITATE.  Family Communication: Patient's daughter.  Disposition Plan: Skilled nursing facility.  Consults called: None.  Admission status: Inpatient. Likely stay 2-3 days.    Eduard Clos MD [facility] Pager [phone].  If 7PM-7AM, please contact night-coverage [email or web address] Password [facility]  07/25/2015, 9:13 PM

## 2015-07-25 NOTE — ED Notes (Addendum)
Pt resident of [facility], brought in by EMS. Per facility and EMS, pt was discharge [facility] ED on Monday after being treated for UTI. Per facility, pt has continued hematuria,fever, and no intake or output today.

## 2015-07-25 NOTE — ED Notes (Signed)
Dr Richrd PrimeMcmannus made aware unable to obtain IV access. Gave verbal order to call vascular access team to obtain PICC line. Vascular team notified . Daughter enroute to sign consent

## 2015-07-25 NOTE — ED Notes (Signed)
Vascular access team in with pt. Daughter signed informed consent

## 2015-07-25 NOTE — ED Provider Notes (Signed)
CSN: [id number]     Arrival date & time 07/25/15  1632 History   First MD Initiated Contact with Patient 07/25/15 1653     Chief Complaint  Patient presents with  . Fever  . Urine Output    decreased     Patient is a 82 y.o. female presenting with fever. The history is provided by the nursing home and the EMS personnel. The history is limited by the condition of the patient (Hx dementia).  Fever Pt was seen at 1655. Per EMS and NH report: Pt was d/c from the hospital 4 days ago for dx ARF, fever, UTI.  NH states pt has had continued fevers and "no intake or urine output today." Pt has significant hx of dementia and is currently singing.    Past Medical History  Diagnosis Date  . HTN (hypertension)   . Stroke (HCC)   . Diabetes mellitus without complication (HCC)   . Embolism (HCC)   . PVD (peripheral vascular disease) (HCC)   . Dementia   . Chronic kidney disease   . Neuromuscular dysfunction of bladder   . Extended spectrum beta lactamase (ESBL) resistance   . Hyperlipidemia   . Gastroesophageal reflux disease   . Dementia   . Plasma protein disorder   . Osteoporosis   . Pressure ulcer of left buttock   . Vitamin D deficiency   . Constipation   . Hypokalemia   . Retention of urine   . Peripheral vascular disease (HCC)   . Dysphagia    Past Surgical History  Procedure Laterality Date  . Below knee leg amputation      left  . 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 Lump removed      from left breast  . S/p hysterectomy    . Cholecystectomy    . Esophagogastroduodenoscopy  09/23/08    SLF:no barrett's ring ,mass or stricture    Social History  Substance Use Topics  . Smoking status: Never Smoker   . Smokeless tobacco: None  . Alcohol Use: No    Review of Systems  Unable to perform ROS: Dementia  Constitutional: Positive for fever.      Allergies  Penicillins  Home Medications   Prior to Admission medications    Medication Sig Start Date End Date Taking? Authorizing Provider  Amino Acids-Protein Hydrolys (FEEDING SUPPLEMENT, PRO-STAT SUGAR FREE 64,) LIQD Take 30 mLs by mouth 2 (two) times daily.    Historical Provider, MD  amLODipine (NORVASC) 5 MG tablet Take 1 tablet (5 mg total) by mouth daily. 07/21/15   Henderson Cloud, MD  aspirin EC 81 MG tablet Take 81 mg by mouth daily.    Historical Provider, MD  cholecalciferol (VITAMIN D) 1000 UNITS tablet Take 2,000 Units by mouth daily.     Historical Provider, MD  Cranberry 450 MG CAPS Take 450 mg by mouth daily.    Historical Provider, MD  HYDROcodone-acetaminophen (NORCO/VICODIN) 5-325 MG tablet Take 1 tablet by mouth every 12 (twelve) hours. 07/21/15   Henderson Cloud, MD  insulin aspart (NOVOLOG) 100 UNIT/ML injection Inject 2-5 Units into the skin 4 (four) times daily - after meals and at bedtime. Per sliding. 200-250= 2 units >70 call MD; 251-300 = 3 units; 301-350 = 4 units; 351-400 = 5 units; <400 call MD.    Historical Provider, MD  lovastatin (MEVACOR) 40 MG tablet Take 60 mg by mouth at bedtime.  Historical Provider, MD  metoprolol tartrate (LOPRESSOR) 25 MG tablet Take 1 tablet (25 mg total) by mouth 2 (two) times daily. 07/21/15   Henderson CloudEstela Y Hernandez Acosta, MD  omeprazole (PRILOSEC) 20 MG capsule Take 20 mg by mouth daily. 05/26/12   Joselyn ArrowKandice L Jones, NP  senna-docusate (SENNA PLUS) 8.6-50 MG tablet Take 1 tablet by mouth 2 (two) times daily.    Historical Provider, MD   BP 140/70 mmHg  Pulse 87  Temp(Src) 100.6 F (38.1 C) (Rectal)  Resp 20  Ht 5\' 5"  (1.651 m)  Wt 120 lb (54.432 kg)  BMI 19.97 kg/m2  SpO2 98%  BP 114/75 mmHg  Pulse 97  Temp(Src) 100.6 F (38.1 C) (Rectal)  Resp 28  Ht 5\' 5"  (1.651 m)  Wt 120 lb (54.432 kg)  BMI 19.97 kg/m2  SpO2 98%  Physical Exam  1700: Physical examination:  Nursing notes reviewed; Vital signs and O2 SAT reviewed; +febrile.;; Constitutional: Well developed, Well nourished, In  no acute distress; Head:  Normocephalic, atraumatic; Eyes: EOMI, PERRL, No scleral icterus; ENMT: Mouth and pharynx normal, Mucous membranes dry; Neck: Supple, Full range of motion, No lymphadenopathy; Cardiovascular: Regular rate and rhythm, No gallop; Respiratory: Breath sounds clear & equal bilaterally, No wheezes.  Speaking full sentences with ease, Normal respiratory effort/excursion; Chest: Nontender, Movement normal; Abdomen: Soft, Nontender, Nondistended, Normal bowel sounds; Genitourinary: No CVA tenderness. +foley in place, no urine in tubing.; Extremities: Pulses normal, No tenderness, Bilat LE's amputations..; Neuro: Awake, alert, confused per hx dementia. No facial droop. Speech clear. Moves extremities on stretcher.; Skin: Color normal, Warm, Dry.   ED Course  Procedures (including critical care time) Labs Review  Imaging Review  I have personally reviewed and evaluated these images and lab results as part of my medical decision-making.   EKG Interpretation None      MDM  MDM Reviewed: previous chart, nursing note and vitals Reviewed previous: labs and ECG Interpretation: labs, ECG and x-ray Total time providing critical care: 30-74 minutes. This excludes time spent performing separately reportable procedures and services. Consults: admitting MD   CRITICAL CARE Performed by: Laray AngerMCMANUS,Kortnee Bas M Total critical care time: 35 minutes Critical care time was exclusive of separately billable procedures and treating other patients. Critical care was necessary to treat or prevent imminent or life-threatening deterioration. Critical care was time spent personally by me on the following activities: development of treatment plan with patient and/or surrogate as well as nursing, discussions with consultants, evaluation of patient's response to treatment, examination of patient, obtaining history from patient or surrogate, ordering and performing treatments and interventions, ordering  and review of laboratory studies, ordering and review of radiographic studies, pulse oximetry and re-evaluation of patient's condition.   ED ECG REPORT   Date: 07/25/2015  Rate: 100  Rhythm: sinus tachycardia, artifact  QRS Axis: normal  Intervals: QT prolonged?  ST/T Wave abnormalities: indeterminate  Conduction Disutrbances:nonspecific intraventricular conduction delay  Narrative Interpretation:   Old EKG Reviewed: poor data quality in current EKG precludes serial comparison.   Results for orders placed or performed during the hospital encounter of 07/25/15  Blood Culture (routine x 2)  Result Value Ref Range   Specimen Description RIGHT ANTECUBITAL    Special Requests BOTTLES DRAWN AEROBIC ONLY 6CC    Culture PENDING    Report Status PENDING   Blood Culture (routine x 2)  Result Value Ref Range   Specimen Description RIGHT ANTECUBITAL    Special Requests BOTTLES DRAWN AEROBIC ONLY 6CC  Culture PENDING    Report Status PENDING   Comprehensive metabolic panel  Result Value Ref Range   Sodium 128 (L) 135 - 145 mmol/L   Potassium 4.6 3.5 - 5.1 mmol/L   Chloride 105 101 - 111 mmol/L   CO2 12 (L) 22 - 32 mmol/L   Glucose, Bld 120 (H) 65 - 99 mg/dL   BUN 75 (H) 6 - 20 mg/dL   Creatinine, Ser 8.11 (H) 0.44 - 1.00 mg/dL   Calcium 7.4 (L) 8.9 - 10.3 mg/dL   Total Protein 5.6 (L) 6.5 - 8.1 g/dL   Albumin 1.9 (L) 3.5 - 5.0 g/dL   AST 17 15 - 41 U/L   ALT 9 (L) 14 - 54 U/L   Alkaline Phosphatase 101 38 - 126 U/L   Total Bilirubin 0.5 0.3 - 1.2 mg/dL   GFR calc non Af Amer 5 (L) >60 mL/min   GFR calc Af Amer 6 (L) >60 mL/min   Anion gap 11 5 - 15  CBC WITH DIFFERENTIAL  Result Value Ref Range   WBC 20.3 (H) 4.0 - 10.5 K/uL   RBC 2.65 (L) 3.87 - 5.11 MIL/uL   Hemoglobin 7.9 (L) 12.0 - 15.0 g/dL   HCT 91.4 (L) 78.2 - 95.6 %   MCV 83.8 78.0 - 100.0 fL   MCH 29.8 26.0 - 34.0 pg   MCHC 35.6 30.0 - 36.0 g/dL   RDW 21.3 (H) 08.6 - 57.8 %   Platelets 196 150 - 400 K/uL    Neutrophils Relative % 97 %   Neutro Abs 19.6 (H) 1.7 - 7.7 K/uL   Lymphocytes Relative 2 %   Lymphs Abs 0.3 (L) 0.7 - 4.0 K/uL   Monocytes Relative 1 %   Monocytes Absolute 0.3 0.1 - 1.0 K/uL   Eosinophils Relative 0 %   Eosinophils Absolute 0.1 0.0 - 0.7 K/uL   Basophils Relative 0 %   Basophils Absolute 0.0 0.0 - 0.1 K/uL  Urinalysis, Routine w reflex microscopic (not at [facility])  Result Value Ref Range   Color, Urine AMBER (A) YELLOW   APPearance CLOUDY (A) CLEAR   Specific Gravity, Urine 1.010 1.005 - 1.030   pH 6.0 5.0 - 8.0   Glucose, UA NEGATIVE NEGATIVE mg/dL   Hgb urine dipstick LARGE (A) NEGATIVE   Bilirubin Urine NEGATIVE NEGATIVE   Ketones, ur NEGATIVE NEGATIVE mg/dL   Protein, ur >469 (A) NEGATIVE mg/dL   Nitrite NEGATIVE NEGATIVE   Leukocytes, UA LARGE (A) NEGATIVE  Urine microscopic-add on  Result Value Ref Range   Squamous Epithelial / LPF 0-5 (A) NONE SEEN   WBC, UA TOO NUMEROUS TO COUNT 0 - 5 WBC/hpf   RBC / HPF TOO NUMEROUS TO COUNT 0 - 5 RBC/hpf   Bacteria, UA MANY (A) NONE SEEN   Urine-Other YEAST PRESENT   I-Stat CG4 Lactic Acid, ED  (not at  [facility])  Result Value Ref Range   Lactic Acid, Venous 2.29 (HH) 0.5 - 2.0 mmol/L   Comment NOTIFIED PHYSICIAN    Dg Chest Port 1 View 07/25/2015  CLINICAL DATA:  Reason for exam: fever Pt not responding to questions. Hx HTN, DM EXAM: PORTABLE CHEST 1 VIEW COMPARISON:  07/04/2015 FINDINGS: The heart is enlarged. There are bibasilar opacities, left greater than right. The left hemidiaphragm is obscured. Suspect left pleural effusion. No pulmonary edema. IMPRESSION: 1. Cardiomegaly without pulmonary edema. 2. Bilateral lower lobe infiltrates, left greater than right. Electronically Signed   By: Norva Pavlov  M.D.   On: 07/25/2015 17:38    Results for AZIYA, ARENA (MRN [id number]) as of 07/25/2015 18:40  Ref. Range 07/05/2015 05:06 07/06/2015 05:15 07/09/2015 04:34 07/14/2015 04:43 07/15/2015 04:42 07/25/2015 18:05  Hemoglobin  Latest Ref Range: 12.0-15.0 g/dL 9.9 (L) 9.7 (L) 78.2 (L) 9.4 (L) 9.3 (L) 7.9 (L)  HCT Latest Ref Range: 36.0-46.0 % 29.3 (L) 27.6 (L) 28.3 (L) 26.6 (L) 26.7 (L) 22.2 (L)   Results for SHERRIANN, SZUCH (MRN [id number]) as of 07/25/2015 19:25  Ref. Range 07/19/2015 08:34 07/20/2015 05:51 07/21/2015 08:05 07/25/2015 18:05  BUN Latest Ref Range: 6-20 mg/dL 52 (H) 43 (H) 39 (H) 75 (H)  Creatinine Latest Ref Range: 0.44-1.00 mg/dL 5.78 (H) 4.69 (H) 6.29 (H) 6.85 (H)    1740:  ED RN and ED Tech states they removed the foley catheter to change it: the "balloon seemed overly full" and as soon as the catheter was removed a "very large amount of urine" spontaneously drained from pt's urethra. New foley placed with pink urine output.  1925:  BUN/Cr elevated. New hyponatremia on labs. Foley continues to drain urine freely. VSS, resps easy. BC and UC pending. IV abx started for HCAP and UTI. Dx and testing d/w pt's family.  Questions answered.  Verb understanding, agreeable to admit. T/C to [facility]. Toniann Fail, case discussed, including:  HPI, pertinent PM/SHx, VS/PE, dx testing, ED course and treatment:  Agreeable to admit, requests to write temporary orders, obtain stepdown bed to team APAdmits.   Samuel Jester, DO 07/28/15 1651

## 2015-07-25 NOTE — ED Notes (Signed)
[facility] nurse unable to take report. States will call me back

## 2015-07-25 NOTE — ED Notes (Signed)
Dr Richrd PrimeMcmannus notified of temp 101.1 rectal. Instructed to give tylenol every 4 hrs

## 2015-07-25 NOTE — ED Notes (Signed)
Definity ordered on wrong pt

## 2015-07-25 NOTE — Progress Notes (Signed)
Pharmacy Antibiotic Note  Sara GuarneriHelen W [name] is a 82 y.o. female admitted on 07/25/2015 with pneumonia.  Pharmacy has been consulted for Vancomycin and Aztreonam dosing. Acute renal failure , Cxray shows bilateral infiltrates Plan: Aztreonam 500mg  IV every 8 hours Vancomycin 1gm iv Loading dose given in ED.  Check vancomycin random level in 48 hours. Goal trough 15-4420mcg/ml F/U Vancomycin level, cultures, labs and renal function  Height: 5\' 5"  (165.1 cm) Weight: 120 lb (54.432 kg) IBW/kg (Calculated) : 57  Temp (24hrs), Avg:100.9 F (38.3 C), Min:100.6 F (38.1 C), Max:101.1 F (38.4 C)   Recent Labs Lab 07/19/15 0834 07/20/15 0551 07/21/15 0805 07/25/15 1805 07/25/15 1818  WBC  --   --   --  20.3*  --   CREATININE 4.17* 3.33* 2.88* 6.85*  --   LATICACIDVEN  --   --   --   --  2.29*    Estimated Creatinine Clearance: 5.7 mL/min (by C-G formula based on Cr of 6.85).    Allergies  Allergen Reactions  . Penicillins     REACTION: unknown reaction: MAR reported    Antimicrobials this admission: Vancomycin 5/12 >>  Aztreonam 5/12 >>   Dose adjustments this admission:  Microbiology results: 5/12 BCx: pending 5/12 UCx: pending  Thank you for allowing pharmacy to be a part of this patient's care.  Elder CyphersLorie Tameca Jerez, BS Loura Backharm D, [place] Clinical Pharmacist Pager [phone] 07/25/2015 9:31 PM

## 2015-07-26 DIAGNOSIS — E1165 Type 2 diabetes mellitus with hyperglycemia: Secondary | ICD-10-CM

## 2015-07-26 DIAGNOSIS — N390 Urinary tract infection, site not specified: Secondary | ICD-10-CM

## 2015-07-26 DIAGNOSIS — E1151 Type 2 diabetes mellitus with diabetic peripheral angiopathy without gangrene: Secondary | ICD-10-CM

## 2015-07-26 DIAGNOSIS — A419 Sepsis, unspecified organism: Secondary | ICD-10-CM | POA: Diagnosis present

## 2015-07-26 LAB — CBC WITH DIFFERENTIAL/PLATELET
BASOS ABS: 0 10*3/uL (ref 0.0–0.1)
Basophils Relative: 0 %
Eosinophils Absolute: 0 10*3/uL (ref 0.0–0.7)
Eosinophils Relative: 0 %
HEMATOCRIT: 19.6 % — AB (ref 36.0–46.0)
Hemoglobin: 6.7 g/dL — CL (ref 12.0–15.0)
LYMPHS ABS: 0.2 10*3/uL — AB (ref 0.7–4.0)
LYMPHS PCT: 2 %
MCH: 29 pg (ref 26.0–34.0)
MCHC: 34.2 g/dL (ref 30.0–36.0)
MCV: 84.8 fL (ref 78.0–100.0)
MONO ABS: 0.2 10*3/uL (ref 0.1–1.0)
MONOS PCT: 2 %
NEUTROS ABS: 12 10*3/uL — AB (ref 1.7–7.7)
Neutrophils Relative %: 96 %
PLATELETS: 174 10*3/uL (ref 150–400)
RBC: 2.31 MIL/uL — ABNORMAL LOW (ref 3.87–5.11)
RDW: 16.7 % — AB (ref 11.5–15.5)
WBC: 12.4 10*3/uL — ABNORMAL HIGH (ref 4.0–10.5)

## 2015-07-26 LAB — COMPREHENSIVE METABOLIC PANEL
ALBUMIN: 1.6 g/dL — AB (ref 3.5–5.0)
ALT: 9 U/L — ABNORMAL LOW (ref 14–54)
AST: 23 U/L (ref 15–41)
Alkaline Phosphatase: 85 U/L (ref 38–126)
Anion gap: 10 (ref 5–15)
BILIRUBIN TOTAL: 0.5 mg/dL (ref 0.3–1.2)
BUN: 72 mg/dL — AB (ref 6–20)
CO2: 15 mmol/L — AB (ref 22–32)
Calcium: 6.9 mg/dL — ABNORMAL LOW (ref 8.9–10.3)
Chloride: 109 mmol/L (ref 101–111)
Creatinine, Ser: 6.37 mg/dL — ABNORMAL HIGH (ref 0.44–1.00)
GFR calc Af Amer: 6 mL/min — ABNORMAL LOW (ref >60)
GFR calc non Af Amer: 6 mL/min — ABNORMAL LOW (ref >60)
GLUCOSE: 134 mg/dL — AB (ref 65–99)
POTASSIUM: 3.3 mmol/L — AB (ref 3.5–5.1)
SODIUM: 134 mmol/L — AB (ref 135–145)
TOTAL PROTEIN: 4.6 g/dL — AB (ref 6.5–8.1)

## 2015-07-26 LAB — GLUCOSE, CAPILLARY
Glucose-Capillary: 102 mg/dL — ABNORMAL HIGH (ref 65–99)
Glucose-Capillary: 134 mg/dL — ABNORMAL HIGH (ref 65–99)
Glucose-Capillary: 141 mg/dL — ABNORMAL HIGH (ref 65–99)
Glucose-Capillary: 147 mg/dL — ABNORMAL HIGH (ref 65–99)
Glucose-Capillary: 79 mg/dL (ref 65–99)
Glucose-Capillary: 89 mg/dL (ref 65–99)
Glucose-Capillary: 90 mg/dL (ref 65–99)

## 2015-07-26 LAB — SODIUM, URINE, RANDOM: Sodium, Ur: 82 mmol/L

## 2015-07-26 LAB — MRSA PCR SCREENING: MRSA by PCR: NEGATIVE

## 2015-07-26 LAB — CREATININE, URINE, RANDOM: Creatinine, Urine: 14.68 mg/dL

## 2015-07-26 LAB — PREPARE RBC (CROSSMATCH)

## 2015-07-26 LAB — ABO/RH: ABO/RH(D): B POS

## 2015-07-26 MED ORDER — SODIUM CHLORIDE 0.9 % IV SOLN
100.0000 mg | INTRAVENOUS | Status: DC
  Administered 2015-07-27 – 2015-07-28 (×2): 100 mg via INTRAVENOUS
  Filled 2015-07-26 (×3): qty 100

## 2015-07-26 MED ORDER — POTASSIUM CHLORIDE IN NACL 20-0.45 MEQ/L-% IV SOLN
INTRAVENOUS | Status: DC
  Administered 2015-07-26 (×2): via INTRAVENOUS
  Filled 2015-07-26 (×7): qty 1000

## 2015-07-26 MED ORDER — SODIUM CHLORIDE 0.9 % IV SOLN
200.0000 mg | Freq: Once | INTRAVENOUS | Status: AC
  Administered 2015-07-26: 200 mg via INTRAVENOUS
  Filled 2015-07-26: qty 200

## 2015-07-26 MED ORDER — SODIUM CHLORIDE 0.9 % IV SOLN
Freq: Once | INTRAVENOUS | Status: AC
  Administered 2015-07-26: 10 mL/h via INTRAVENOUS

## 2015-07-26 MED ORDER — CLINDAMYCIN PHOSPHATE 300 MG/50ML IV SOLN: 300.0000 mg | Freq: Three times a day (TID) | INTRAVENOUS | Status: DC

## 2015-07-26 MED ORDER — CLINDAMYCIN PHOSPHATE 300 MG/50ML IV SOLN
300.0000 mg | Freq: Three times a day (TID) | INTRAVENOUS | Status: DC
  Administered 2015-07-26 – 2015-07-29 (×10): 300 mg via INTRAVENOUS
  Filled 2015-07-26 (×12): qty 50

## 2015-07-26 NOTE — Progress Notes (Signed)
LATE ENTRY: 0550 Lab called to inform that the patient's Hgb is 6.7. -Dr. Toniann FailKakrakandy paged (call returned immediately) and made aware of the critical low value  0600 Noted that the patient can not give informed consent. The patient's daughter [name] called and informed consent received from daughter over telephone -Rob W. RN verified the daughter's verbal consent via the telephone

## 2015-07-26 NOTE — Progress Notes (Signed)
PROGRESS NOTE  [name] YQM:[id number] DOB: April 19, 1933 DOA: 07/25/2015 PCP: Bernerd LimboARIZA,FERNANDO ENRIQUE, MD  Brief Narrative: 82 year old woman with dementia recently hospitalized for 17 days with discharge 5/8 for acute renal failure, gram-positive bacteremia, lactic acidosis who presented5/12 with report of recurring fever. In the emergency department Foley catheter was replaced with a large amount of urine output. Admitted for acute kidney injury, suspected aspiration pneumonia with possible sepsis, UTI, acute on chronic anemia.  Assessment/Plan: 1. 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 Nonoliguric. 2. Sepsis secondary to bilateral pneumonia, favor aspiration. Continue NPO until swallow evaluation. Continue abx. [facility]. Fever resolved and leukocytosis improving.  3. UTI. Continue abx and follow up cultures.  4. Hypertension. Patient provided low-dose of metoprolol and when necessary IV hydralazine, since being placed NPO.  5. Acute on chronic anemia, likely from CKD. Hgb 6.7. No evidence of bleeding. Likely anemia of critical illness. Continue to monitor.  6. Diabetes mellitus type 2 with peripheral vascular disease 7. Stage 3 decubitus ulcer on the scaral area and buttock. Present on admission. No active discharge noted at this time. 8.  Hypokalemia, replete.    Continue IV fluids, consult nephrology for assistance.  Appears quite ill with guarded prognosis. Antibiotics directed at aspiration and UTI. MRSA PCR negative and MRSA not suspected at this time. Follow-up cultures.   Transfuse 1unit PRBC and follow-up CBC   We'll discuss with family when available  DVT prophylaxis: Heparin Code Status: DNR Family Communication: No family bedside.  Disposition Plan: Return to skilled nursing facility if improves  Brendia Sacksaniel Amorette Charrette, MD  [facility] Direct contact:  --Via amion app  OR  --[email or web address]; password [email or web address] and click  7PM-7AM contact night coverage as above 07/26/2015, 10:05 AM  LOS: 1 day   Consultants:  None  Procedures:  None  Antimicrobials:  Aztreonam 5/13 >>  Vancomycin 5/13, given renal functions were present for several days   Clindamycin 5/13 >>  HPI/Subjective: Feeling ok. No reports of chest pain, shortness of breath, n/v/d, or abd pain. Has advanced dementia, history unreliable  Objective: Filed Vitals:   07/26/15 0400 07/26/15 0700 07/26/15 0800 07/26/15 0900  BP:  113/48 122/49 97/47  Pulse:  72 72 74  Temp: 97.3 F (36.3 C)     TempSrc: Axillary     Resp:  14 18 15   Height:      Weight: 52.7 kg (116 lb 2.9 oz)     SpO2:  100% 98% 98%    Intake/Output Summary (Last 24 hours) at 07/26/15 1005 Last data filed at 07/26/15 0800  Gross per 24 hour  Intake      0 ml  Output   2700 ml  Net  -2700 ml     Filed Weights   07/25/15 1646 07/25/15 2234 07/26/15 0400  Weight: 54.432 kg (120 lb) 53.6 kg (118 lb 2.7 oz) 52.7 kg (116 lb 2.9 oz)    Exam: Constitutional:  . Appears calm and comfortable, ill but nontoxic . Bitemporal wasting.  Eyes:  . Pupils and irises appear normal . Conjunctivae and lids appear normal ENMT:  . grossly normal hearing  . Lips are dry, poor dention. . Unable to visualize further due to poor cooperation  Neck:  . neck appears normal, no masses, normal ROM . no thyromegaly Respiratory:  . CTA bilaterally, no w/r/r.  . Respiratory effort normal. No retractions or accessory muscle use Cardiovascular:  . RRR, no  r/g 2/6 holosystolic murmur.  . No LE extremity edema,  bilateral BKA.  . Telemetry SR Abdomen:  . Abdomen appears normal; no tenderness or masses Ecchymosis lower abdomen Blood and abrasion left lateral area.  . No hernias noted Musculoskeletal:  .  Long dystrophic nail on left middle finger.  . RUE, LUE, RLE, LLE   o strength and tone normal, no abnormal movements o No  tenderness, masses Skin:  . No rashes, lesions, ulcers noted. Bruising lower abdomen as above consistent with administration of heparin . palpation of skin: no induration or nodules Neurologic:  . Grossly normal but difficult to assess Psychiatric:  o Alert follows simple commands.  o Suspect confusion at baseline.   I have personally reviewed following labs and imaging studies:  Blood sugars stable  Sodium 134  Potassium 3.3  Creatinine 6.37/BUN72, modest improvement  WBC 12.4  Hgb 6.7  Bilateral lower lobe infiltrates left greater than right on chest x-ray  EKG sinus tach, poor quality  Scheduled Meds: . aztreonam  500 mg Intravenous Q8H  . clindamycin (CLEOCIN) IV  300 mg Intravenous Q8H  . clindamycin (CLEOCIN) IV  300 mg Intravenous Q8H  . heparin  5,000 Units Subcutaneous Q8H  . insulin aspart  0-9 Units Subcutaneous Q4H   Continuous Infusions: . sodium chloride      Principal Problem:   Renal failure (ARF), acute on chronic (HCC) Active Problems:   PERIPHERAL VASCULAR DISEASE   DM (diabetes mellitus), type 2, uncontrolled, periph vascular complic (HCC)   UTI (urinary tract infection)   Pressure ulcer   Healthcare-associated pneumonia   Type 2 diabetes mellitus with renal manifestations (HCC)   Hypertension   Chronic anemia   Sepsis (HCC)   LOS: 1 day   Time spent 25 minutes   By signing my name belowZadie CleverlyAugustus attest that this documentation has been prepared under the direction and in the presence of Brendia Sacks, MD Electronically signed: Zadie Cleverly  07/26/2015 9:25am   I personally performed the services described in this documentation. All medical record entries made by the scribe were at my direction. I have reviewed the chart and agree that the record reflects my personal performance and is accurate and complete. Brendia Sacks, MD

## 2015-07-26 NOTE — Progress Notes (Signed)
CRITICAL VALUE ALERT  Critical value received:  Aerobic bottle blood culture positive for yeast  Date of notification:  07/26/2015  Time of notification:  1720  Critical value read back:yes  Nurse who received alert:  E.Valiant Dills RN  MD notified (1st page):  Dr. Irene LimboGoodrich   Time of first page:  1723  MD notified (2nd page):  Time of second page:  Responding MD:  Dr. Irene LimboGoodrich  Time MD responded: 1800

## 2015-07-26 NOTE — Consult Note (Signed)
Reason for Consult: Acute kidney injury superimposed on chronic Referring Physician: Dr. Ernest Haber is an 82 y.o. female.  HPI: She is a patient who has history of diabetes, hypertension, severe dementia and recently discharged from the hospital after a long stay because of acute kidney injury. At that time the etiology for her acute kidney injury was thought to be secondary to combination of gentamicin/vancomycin/ACE inhibitor. After a couple of weeks her renal function start recovering and patient was discharged 5 days ago with creatinine of 2.88. Presently patient was brought back because of fever, decreased urine output. Presently her BUN and creatinine has increased significantly hence consult is called. Because of her dementia unable to get any additional information.  Past Medical History  Diagnosis Date  . HTN (hypertension)   . Stroke ([place])   . Diabetes mellitus without complication ([place])   . Embolism ([place])   . PVD (peripheral vascular disease) ([place])   . Dementia   . Chronic kidney disease   . Neuromuscular dysfunction of bladder   . Extended spectrum beta lactamase (ESBL) resistance   . Hyperlipidemia   . Gastroesophageal reflux disease   . Dementia   . Plasma protein disorder   . Osteoporosis   . Pressure ulcer of left buttock   . Vitamin D deficiency   . Constipation   . Hypokalemia   . Retention of urine   . Peripheral vascular disease ([place])   . Dysphagia     Past Surgical History  Procedure Laterality Date  . Below knee leg amputation      left  . 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 Lump removed      from left breast  . S/p hysterectomy    . Cholecystectomy    . Esophagogastroduodenoscopy  09/23/08    SLF:no barrett's ring ,mass or stricture    Family History  Problem Relation Age of Onset  . Hypertension Other     Social History:  reports that she has never smoked. She does not have any smokeless tobacco  history on file. She reports that she does not drink alcohol or use illicit drugs.  Allergies:  Allergies  Allergen Reactions  . Penicillins     REACTION: unknown reaction: MAR reported    Medications: I have reviewed the patient's current medications.  Results for orders placed or performed during the hospital encounter of 07/25/15 (from the past 48 hour(s))  Urinalysis, Routine w reflex microscopic (not at [facility])     Status: Abnormal   Collection Time: 07/25/15  5:27 PM  Result Value Ref Range   Color, Urine AMBER (A) YELLOW    Comment: BIOCHEMICALS MAY BE AFFECTED BY COLOR   APPearance CLOUDY (A) CLEAR   Specific Gravity, Urine 1.010 1.005 - 1.030   pH 6.0 5.0 - 8.0   Glucose, UA NEGATIVE NEGATIVE mg/dL   Hgb urine dipstick LARGE (A) NEGATIVE   Bilirubin Urine NEGATIVE NEGATIVE   Ketones, ur NEGATIVE NEGATIVE mg/dL   Protein, ur >300 (A) NEGATIVE mg/dL   Nitrite NEGATIVE NEGATIVE   Leukocytes, UA LARGE (A) NEGATIVE  Urine microscopic-add on     Status: Abnormal   Collection Time: 07/25/15  5:27 PM  Result Value Ref Range   Squamous Epithelial / LPF 0-5 (A) NONE SEEN   WBC, UA TOO NUMEROUS TO COUNT 0 - 5 WBC/hpf   RBC / HPF TOO NUMEROUS TO COUNT 0 - 5 RBC/hpf  Bacteria, UA MANY (A) NONE SEEN   Urine-Other YEAST PRESENT   Comprehensive metabolic panel     Status: Abnormal   Collection Time: 07/25/15  6:05 PM  Result Value Ref Range   Sodium 128 (L) 135 - 145 mmol/L   Potassium 4.6 3.5 - 5.1 mmol/L   Chloride 105 101 - 111 mmol/L   CO2 12 (L) 22 - 32 mmol/L   Glucose, Bld 120 (H) 65 - 99 mg/dL   BUN 75 (H) 6 - 20 mg/dL   Creatinine, Ser 6.85 (H) 0.44 - 1.00 mg/dL   Calcium 7.4 (L) 8.9 - 10.3 mg/dL   Total Protein 5.6 (L) 6.5 - 8.1 g/dL   Albumin 1.9 (L) 3.5 - 5.0 g/dL   AST 17 15 - 41 U/L   ALT 9 (L) 14 - 54 U/L   Alkaline Phosphatase 101 38 - 126 U/L   Total Bilirubin 0.5 0.3 - 1.2 mg/dL   GFR calc non Af Amer 5 (L) >60 mL/min   GFR calc Af Amer 6 (L) >60 mL/min     Comment: (NOTE) The eGFR has been calculated using the CKD EPI equation. This calculation has not been validated in all clinical situations. eGFR's persistently <60 mL/min signify possible Chronic Kidney Disease.    Anion gap 11 5 - 15  CBC WITH DIFFERENTIAL     Status: Abnormal   Collection Time: 07/25/15  6:05 PM  Result Value Ref Range   WBC 20.3 (H) 4.0 - 10.5 K/uL   RBC 2.65 (L) 3.87 - 5.11 MIL/uL   Hemoglobin 7.9 (L) 12.0 - 15.0 g/dL   HCT 22.2 (L) 36.0 - 46.0 %   MCV 83.8 78.0 - 100.0 fL   MCH 29.8 26.0 - 34.0 pg   MCHC 35.6 30.0 - 36.0 g/dL   RDW 16.6 (H) 11.5 - 15.5 %   Platelets 196 150 - 400 K/uL   Neutrophils Relative % 97 %   Neutro Abs 19.6 (H) 1.7 - 7.7 K/uL   Lymphocytes Relative 2 %   Lymphs Abs 0.3 (L) 0.7 - 4.0 K/uL   Monocytes Relative 1 %   Monocytes Absolute 0.3 0.1 - 1.0 K/uL   Eosinophils Relative 0 %   Eosinophils Absolute 0.1 0.0 - 0.7 K/uL   Basophils Relative 0 %   Basophils Absolute 0.0 0.0 - 0.1 K/uL  Blood Culture (routine x 2)     Status: None (Preliminary result)   Collection Time: 07/25/15  6:05 PM  Result Value Ref Range   Specimen Description RIGHT ANTECUBITAL    Special Requests BOTTLES DRAWN AEROBIC ONLY 6CC    Culture PENDING    Report Status PENDING   Blood Culture (routine x 2)     Status: None (Preliminary result)   Collection Time: 07/25/15  6:05 PM  Result Value Ref Range   Specimen Description RIGHT ANTECUBITAL    Special Requests BOTTLES DRAWN AEROBIC ONLY 6CC    Culture PENDING    Report Status PENDING   I-Stat CG4 Lactic Acid, ED  (not at  [facility])     Status: Abnormal   Collection Time: 07/25/15  6:18 PM  Result Value Ref Range   Lactic Acid, Venous 2.29 (HH) 0.5 - 2.0 mmol/L   Comment NOTIFIED PHYSICIAN   MRSA PCR Screening     Status: None   Collection Time: 07/25/15 10:20 PM  Result Value Ref Range   MRSA by PCR NEGATIVE NEGATIVE    Comment:  The GeneXpert MRSA Assay (FDA approved for NASAL  specimens only), is one component of a comprehensive MRSA colonization surveillance program. It is not intended to diagnose MRSA infection nor to guide or monitor treatment for MRSA infections.   Glucose, capillary     Status: Abnormal   Collection Time: 07/26/15 12:31 AM  Result Value Ref Range   Glucose-Capillary 147 (H) 65 - 99 mg/dL  Glucose, capillary     Status: Abnormal   Collection Time: 07/26/15  4:14 AM  Result Value Ref Range   Glucose-Capillary 141 (H) 65 - 99 mg/dL   Comment 1 Notify RN    Comment 2 Document in Chart   Comprehensive metabolic panel     Status: Abnormal   Collection Time: 07/26/15  5:16 AM  Result Value Ref Range   Sodium 134 (L) 135 - 145 mmol/L   Potassium 3.3 (L) 3.5 - 5.1 mmol/L    Comment: DELTA CHECK NOTED   Chloride 109 101 - 111 mmol/L   CO2 15 (L) 22 - 32 mmol/L   Glucose, Bld 134 (H) 65 - 99 mg/dL   BUN 72 (H) 6 - 20 mg/dL   Creatinine, Ser 6.37 (H) 0.44 - 1.00 mg/dL   Calcium 6.9 (L) 8.9 - 10.3 mg/dL   Total Protein 4.6 (L) 6.5 - 8.1 g/dL   Albumin 1.6 (L) 3.5 - 5.0 g/dL   AST 23 15 - 41 U/L   ALT 9 (L) 14 - 54 U/L   Alkaline Phosphatase 85 38 - 126 U/L   Total Bilirubin 0.5 0.3 - 1.2 mg/dL   GFR calc non Af Amer 6 (L) >60 mL/min   GFR calc Af Amer 6 (L) >60 mL/min    Comment: (NOTE) The eGFR has been calculated using the CKD EPI equation. This calculation has not been validated in all clinical situations. eGFR's persistently <60 mL/min signify possible Chronic Kidney Disease.    Anion gap 10 5 - 15  CBC WITH DIFFERENTIAL     Status: Abnormal   Collection Time: 07/26/15  5:16 AM  Result Value Ref Range   WBC 12.4 (H) 4.0 - 10.5 K/uL   RBC 2.31 (L) 3.87 - 5.11 MIL/uL   Hemoglobin 6.7 (LL) 12.0 - 15.0 g/dL    Comment: CRITICAL RESULT CALLED TO, READ BACK BY AND VERIFIED WITH: HERN,J AT 0546 ON 07/26/2015 BY WOODS,M    HCT 19.6 (L) 36.0 - 46.0 %   MCV 84.8 78.0 - 100.0 fL   MCH 29.0 26.0 - 34.0 pg   MCHC 34.2 30.0 - 36.0  g/dL   RDW 16.7 (H) 11.5 - 15.5 %   Platelets 174 150 - 400 K/uL   Neutrophils Relative % 96 %   Neutro Abs 12.0 (H) 1.7 - 7.7 K/uL   Lymphocytes Relative 2 %   Lymphs Abs 0.2 (L) 0.7 - 4.0 K/uL   Monocytes Relative 2 %   Monocytes Absolute 0.2 0.1 - 1.0 K/uL   Eosinophils Relative 0 %   Eosinophils Absolute 0.0 0.0 - 0.7 K/uL   Basophils Relative 0 %   Basophils Absolute 0.0 0.0 - 0.1 K/uL  Sodium, urine, random     Status: None   Collection Time: 07/26/15  5:30 AM  Result Value Ref Range   Sodium, Ur 82 mmol/L  Creatinine, urine, random     Status: None   Collection Time: 07/26/15  5:30 AM  Result Value Ref Range   Creatinine, Urine 14.68 mg/dL  Prepare RBC  Status: None   Collection Time: 07/26/15  7:40 AM  Result Value Ref Range   Order Confirmation ORDER PROCESSED BY BLOOD BANK   Type and screen [facility]     Status: None (Preliminary result)   Collection Time: 07/26/15  7:40 AM  Result Value Ref Range   ABO/RH(D) B POS    Antibody Screen NEG    Sample Expiration 07/29/2015    Unit Number [id number]    Blood Component Type RED CELLS,LR    Unit division 00    Status of Unit ALLOCATED    Transfusion Status OK TO TRANSFUSE    Crossmatch Result Compatible   ABO/Rh     Status: None   Collection Time: 07/26/15  7:40 AM  Result Value Ref Range   ABO/RH(D) B POS   Glucose, capillary     Status: Abnormal   Collection Time: 07/26/15  8:00 AM  Result Value Ref Range   Glucose-Capillary 134 (H) 65 - 99 mg/dL   Comment 1 Notify RN    Comment 2 Document in Chart     Dg Chest Port 1 View  07/25/2015  CLINICAL DATA:  Reason for exam: fever Pt not responding to questions. Hx HTN, DM EXAM: PORTABLE CHEST 1 VIEW COMPARISON:  07/04/2015 FINDINGS: The heart is enlarged. There are bibasilar opacities, left greater than right. The left hemidiaphragm is obscured. Suspect left pleural effusion. No pulmonary edema. IMPRESSION: 1. Cardiomegaly without pulmonary edema. 2.  Bilateral lower lobe infiltrates, left greater than right. Electronically Signed   By: Nolon Nations M.D.   On: 07/25/2015 17:38    Review of Systems  Unable to perform ROS: dementia   Blood pressure 97/47, pulse 74, temperature 97.3 F (36.3 C), temperature source Axillary, resp. rate 15, height 5' (1.524 m), weight 52.7 kg (116 lb 2.9 oz), SpO2 98 %. Physical Exam  Constitutional: No distress.  Eyes: No scleral icterus.  Neck: No JVD present.  Cardiovascular: Normal rate and regular rhythm.   Respiratory: No respiratory distress. She has no wheezes.  GI: She exhibits no distension.  Musculoskeletal: She exhibits no edema.  Neurological: She is alert.  Skin: There is erythema.    Assessment/Plan: Problem #1 acute kidney injury superimposed on chronic. Etiology could be from obstructive uropathy/prerenal/ATN. Presently her BUN and creatinine has slightly improved. Problem #2 hyponatremia: Possibly hypovolemic hyponatremia. Her sodium has improved Problem #3 hypokalemia: Potassium is declining Problem #4 low CO2 possibly metabolic presently seems to be improving. Problem #5 anemia: Her hemoglobin has declined significantly. Problem #6 history of diabetes Problem #7 sepsis: Possibly pneumonia versus UTI. Presently patient is afebrile and her white blood cell count is improving. Plan: 1]We'll DC normal saline 2] will start her on half normal saline with 20 meq of KCl at undersurface of 135 mL/h 3]We'll check renal panel and CBC in the morning.  Krisann Mckenna S 07/26/2015, 10:06 AM

## 2015-07-27 DIAGNOSIS — J690 Pneumonitis due to inhalation of food and vomit: Secondary | ICD-10-CM | POA: Diagnosis present

## 2015-07-27 DIAGNOSIS — B49 Unspecified mycosis: Secondary | ICD-10-CM | POA: Diagnosis present

## 2015-07-27 LAB — RENAL FUNCTION PANEL
ALBUMIN: 1.6 g/dL — AB (ref 3.5–5.0)
ANION GAP: 9 (ref 5–15)
BUN: 60 mg/dL — ABNORMAL HIGH (ref 6–20)
CALCIUM: 6.7 mg/dL — AB (ref 8.9–10.3)
CO2: 17 mmol/L — ABNORMAL LOW (ref 22–32)
CREATININE: 4.9 mg/dL — AB (ref 0.44–1.00)
Chloride: 109 mmol/L (ref 101–111)
GFR, EST AFRICAN AMERICAN: 9 mL/min — AB (ref >60)
GFR, EST NON AFRICAN AMERICAN: 8 mL/min — AB (ref >60)
Glucose, Bld: 98 mg/dL (ref 65–99)
PHOSPHORUS: 5.1 mg/dL — AB (ref 2.5–4.6)
Potassium: 2.7 mmol/L — CL (ref 3.5–5.1)
SODIUM: 135 mmol/L (ref 135–145)

## 2015-07-27 LAB — BASIC METABOLIC PANEL
Anion gap: 9 (ref 5–15)
BUN: 57 mg/dL — AB (ref 6–20)
CHLORIDE: 109 mmol/L (ref 101–111)
CO2: 16 mmol/L — ABNORMAL LOW (ref 22–32)
Calcium: 6.6 mg/dL — ABNORMAL LOW (ref 8.9–10.3)
Creatinine, Ser: 4.35 mg/dL — ABNORMAL HIGH (ref 0.44–1.00)
GFR calc non Af Amer: 9 mL/min — ABNORMAL LOW (ref >60)
GFR, EST AFRICAN AMERICAN: 10 mL/min — AB (ref >60)
Glucose, Bld: 94 mg/dL (ref 65–99)
Potassium: 3.2 mmol/L — ABNORMAL LOW (ref 3.5–5.1)
SODIUM: 134 mmol/L — AB (ref 135–145)

## 2015-07-27 LAB — TYPE AND SCREEN
ABO/RH(D): B POS
Antibody Screen: NEGATIVE
UNIT DIVISION: 0

## 2015-07-27 LAB — GLUCOSE, CAPILLARY
GLUCOSE-CAPILLARY: 89 mg/dL (ref 65–99)
GLUCOSE-CAPILLARY: 101 mg/dL — AB (ref 65–99)
GLUCOSE-CAPILLARY: 77 mg/dL (ref 65–99)
Glucose-Capillary: 88 mg/dL (ref 65–99)
Glucose-Capillary: 79 mg/dL (ref 65–99)

## 2015-07-27 LAB — CBC
HCT: 27.4 % — ABNORMAL LOW (ref 36.0–46.0)
HEMOGLOBIN: 9.9 g/dL — AB (ref 12.0–15.0)
MCH: 29.9 pg (ref 26.0–34.0)
MCHC: 36.1 g/dL — ABNORMAL HIGH (ref 30.0–36.0)
MCV: 82.8 fL (ref 78.0–100.0)
PLATELETS: 171 10*3/uL (ref 150–400)
RBC: 3.31 MIL/uL — ABNORMAL LOW (ref 3.87–5.11)
RDW: 15.7 % — ABNORMAL HIGH (ref 11.5–15.5)
WBC: 8.3 10*3/uL (ref 4.0–10.5)

## 2015-07-27 LAB — URINE CULTURE: Culture: >=100000 — AB

## 2015-07-27 LAB — MAGNESIUM: MAGNESIUM: 1.2 mg/dL — AB (ref 1.7–2.4)

## 2015-07-27 MED ORDER — MAGNESIUM SULFATE 2 GM/50ML IV SOLN
2.0000 g | Freq: Once | INTRAVENOUS | Status: AC
  Administered 2015-07-27: 2 g via INTRAVENOUS
  Filled 2015-07-27: qty 50

## 2015-07-27 MED ORDER — SODIUM CHLORIDE 0.45 % IV SOLN
INTRAVENOUS | Status: DC
  Administered 2015-07-27 – 2015-07-30 (×6): via INTRAVENOUS
  Filled 2015-07-27 (×13): qty 1000

## 2015-07-27 MED ORDER — POTASSIUM CHLORIDE 10 MEQ/100ML IV SOLN
10.0000 meq | INTRAVENOUS | Status: AC
  Administered 2015-07-27 (×2): 10 meq via INTRAVENOUS
  Filled 2015-07-27 (×2): qty 100

## 2015-07-27 NOTE — Progress Notes (Addendum)
PROGRESS NOTE  [name] YNW:[id number] DOB: [birth year] DOA: 07/25/2015 PCP: Bernerd Limbo, MD  Brief Narrative: 82 year old woman with dementia recently hospitalized for 17 days with discharge 5/8 for acute renal failure, gram-positive bacteremia, lactic acidosis who presented 5/12 with report of recurring fever. In the emergency department Foley catheter was replaced with a large amount of urine output. Admitted for acute kidney injury, suspected aspiration pneumonia with possible sepsis, UTI, acute on chronic anemia.  Assessment/Plan: 1. 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 Improving. Appreciate nephrology. 2. Fungemia, fungal UTI catheter associated present on admission, no other foreign bodies or lines. 3. Sepsis secondary to fungemia, clinically improving 4. Bilateral pneumonia, favor aspiration. Nothing by mouth, follow swallow evaluation. 5. Hypertension. Stable. 6. Acute on chronic anemia, likely from CKD. Hgb 9.9, improved status post transfusion. No evidence of bleeding. Likely anemia of critical illness. Continue to monitor.  7. Diabetes mellitus type 2 with peripheral vascular disease. Remains stable. Continue Novolog. 8. Stage 3 decubitus ulcer on the scaral area and buttock. Present on admission. No active discharge noted at this time. Wound care consultation requested. 9.  Hypokalemia, hypomagnesemia; replaced.    Overall remains critically ill but some improvement in renal function. Continue management per nephrology  Continue abx directed at suspected aspiration and fungemia. Follow up cx.  BMP and CBC in the morning  Will try to get in touch with family.   DVT prophylaxis: Heparin Code Status: DNR Family Communication: No family bedside.  Disposition Plan: Return to skilled nursing facility if improves  Brendia Sacks, MD  [facility] Direct contact:  --Via  amion app OR  --[email or web address]; password [email or web address] and click  7PM-7AM contact night coverage as above 07/27/2015, 11:32 AM  LOS: 2 days   Consultants:  Nephrology  Procedures:  1unit pRBC transfusion 5/13  Antimicrobials:  Aztreonam 5/13 >> 5/14  Vancomycin 5/13, given renal functions were present for several days   Clindamycin 5/13 >>  Eraxis 5/14 >>  HPI/Subjective: Reports that she feels okay. Denies pain. Breathing is okay. Denies nausea.  Objective: Filed Vitals:   07/27/15 0700 07/27/15 0800 07/27/15 0900 07/27/15 1000  BP: 131/67 122/49 136/64 139/58  Pulse: 74 72 67 73  Temp:  97 F (36.1 C)    TempSrc:  Axillary    Resp: Height:      Weight:      SpO2: 100% 99% 99% 99%    Intake/Output Summary (Last 24 hours) at 07/27/15 1132 Last data filed at 07/27/15 0500  Gross per 24 hour  Intake    335 ml  Output   3150 ml  Net  -2815 ml     Filed Weights   07/25/15 2234 07/26/15 0400 07/27/15 0500  Weight: 53.6 kg (118 lb 2.7 oz) 52.7 kg (116 lb 2.9 oz) 51.6 kg (113 lb 12.1 oz)    Exam:   Constitutional:  . Appears ill but not toxic. Eyes:  Marland Kitchen Appear grossly normal ENMT:  . external ears, nose appear normal  . grossly normal hearing  . Limited view. Poor dentition. Dry mucus membranes. Respiratory:  . CTA bilaterally, no w/r/r.  . Respiratory effort normal. No retractions or accessory muscle use Cardiovascular:  . RRR, no m/r/g. 2/6 systolic murmer. No rub or gallop   . Telemetry SR . Note bilateral BKA. Abdomen:  . Abdomen appears normal; no tenderness or masses. Foley cath in place Musculoskeletal:  o Bilateral  BKA Skin:  . -no changes compared to yesterday. Psychiatric:  . Follows simple commands but appears confused   I have personally reviewed following labs and imaging studies:  CBG unremarkable  WBC wnl  Hgb up to 9.9 status post transfusion  Cr improved, 4.69, BUN trending down, 60.Potassium 2.7  Mg 1.2  Blood  cultures noted    Scheduled Meds: . anidulafungin  100 mg Intravenous Q24H  . clindamycin (CLEOCIN) IV  300 mg Intravenous Q8H  . heparin  5,000 Units Subcutaneous Q8H  . insulin aspart  0-9 Units Subcutaneous Q4H   Continuous Infusions: . sodium chloride 0.45 % with kcl 135 mL/hr at 07/27/15 1059    Principal Problem:   Renal failure (ARF), acute on chronic (HCC) Active Problems:   PERIPHERAL VASCULAR DISEASE   DM (diabetes mellitus), type 2, uncontrolled, periph vascular complic (HCC)   UTI (urinary tract infection)   Pressure ulcer   Type 2 diabetes mellitus with renal manifestations (HCC)   Hypertension   Chronic anemia   Sepsis (HCC)   Fungemia   Aspiration pneumonia (HCC)   LOS: 2 days   Time spent 25 minutes   By signing my name below, I, Adron BeneGreylon Gawaluck, attest that this documentation has been prepared under the direction and in the presence of Daniel P. Irene LimboGoodrich, MD. Electronically Signed: Adron BeneGreylon Gawaluck, Scribe.  07/27/2015 9:25am  I personally performed the services described in this documentation. All medical record entries made by the scribe were at my direction. I have reviewed the chart and agree that the record reflects my personal performance and is accurate and complete. Brendia Sacksaniel Goodrich, MD

## 2015-07-27 NOTE — Progress Notes (Signed)
Subjective: Interval History: none.  Objective: Vital signs in last 24 hours: Temp:  [97.2 F (36.2 C)-99.4 F (37.4 C)] 97.9 F (36.6 C) (05/14 0400) Pulse Rate:  [58-98] 73 (05/14 0500) Resp:  [14-29] 14 (05/14 0500) BP: (97-140)/(47-85) 116/60 mmHg (05/14 0500) SpO2:  [98 %-100 %] 99 % (05/14 0500) Weight:  [51.6 kg (113 lb 12.1 oz)] 51.6 kg (113 lb 12.1 oz) (05/14 0500) Weight change: -2.832 kg (-6 lb 3.9 oz)  Intake/Output from previous day: 05/13 0701 - 05/14 0700 In: 585 [I.V.:250; Blood:335] Out: 4050 [Urine:4050] Intake/Output this shift:    General appearance: alert, cooperative and no distress Resp: clear to auscultation bilaterally Cardio: regular rate and rhythm, S1, S2 normal, no murmur, click, rub or gallop GI: soft, non-tender; bowel sounds normal; no masses,  no organomegaly Extremities: Bilateral BKA and no edema  Lab Results:  Recent Labs  07/26/15 0516 07/27/15 0455  WBC 12.4* 8.3  HGB 6.7* 9.9*  HCT 19.6* 27.4*  PLT 174 171   BMET:  Recent Labs  07/26/15 0516 07/27/15 0455  NA 134* 135  K 3.3* 2.7*  CL 109 109  CO2 15* 17*  GLUCOSE 134* 98  BUN 72* 60*  CREATININE 6.37* 4.90*  CALCIUM 6.9* 6.7*   No results for input(s): PTH in the last 72 hours. Iron Studies: No results for input(s): IRON, TIBC, TRANSFERRIN, FERRITIN in the last 72 hours.  Studies/Results: Dg Chest Port 1 View  07/25/2015  CLINICAL DATA:  Reason for exam: fever Pt not responding to questions. Hx HTN, DM EXAM: PORTABLE CHEST 1 VIEW COMPARISON:  07/04/2015 FINDINGS: The heart is enlarged. There are bibasilar opacities, left greater than right. The left hemidiaphragm is obscured. Suspect left pleural effusion. No pulmonary edema. IMPRESSION: 1. Cardiomegaly without pulmonary edema. 2. Bilateral lower lobe infiltrates, left greater than right. Electronically Signed   By: Norva PavlovElizabeth  Brown M.D.   On: 07/25/2015 17:38    I have reviewed the patient's current  medications.  Assessment/Plan: Problem #1 acute kidney injury superimposed on chronic. Presently her renal function seems to be progressively improving. Patient is nonoliguric with 4 L of urine output over the last 24 hours. Problem #2 hypokalemia: Her potassium has declined. Presently she is on potassium supplements. His IV fluid. Problem #3 low CO2: Presently improving Problem #4 diabetes Problem #5 history of pneumonia Problem #6 hypertension: Her blood pressure is reasonably controlled Problem #7 anemia: She is status post blood transfusion her hemoglobin has improved.  Problem #8 metabolic bone disease: Corrected calcium for albumin seems to be in normal range. Her phosphorus is slightly high. Plan: DC half normal saline with 20 mEq of KCl 2] will change IV fluid to half normal saline with 40 mEq of KCl at 1 35 mL/h 3] will check renal panel in the morning.   LOS: 2 days   Seyed Heffley S 07/27/2015,7:52 AM

## 2015-07-27 NOTE — Progress Notes (Signed)
Report given to Jessica Hearn, RN. 

## 2015-07-27 NOTE — Plan of Care (Signed)
Problem: Education: Goal: Knowledge of [facility] Education information/materials will improve Outcome: Not Progressing Patient has dementia     

## 2015-07-27 NOTE — Consult Note (Signed)
[facility]    Date of Admission:  07/25/2015   Total days of antibiotics 3        Day 2 clindamycin        Day 2 anidulafungin              Reason for Consult: Automatic consultation for fungemia    Referring Physician: Dr. Doran Stableran Goodrich  Principal Problem:   Fungemia Active Problems:   UTI (urinary tract infection)   Sepsis (HCC)   Aspiration pneumonia (HCC)   PERIPHERAL VASCULAR DISEASE   DM (diabetes mellitus), type 2, uncontrolled, periph vascular complic (HCC)   Pressure ulcer   Renal failure (ARF), acute on chronic (HCC)   Type 2 diabetes mellitus with renal manifestations (HCC)   Hypertension   Chronic anemia   . anidulafungin  100 mg Intravenous Q24H  . clindamycin (CLEOCIN) IV  300 mg Intravenous Q8H  . heparin  5,000 Units Subcutaneous Q8H  . insulin aspart  0-9 Units Subcutaneous Q4H  . magnesium sulfate 1 - 4 g bolus IVPB  2 g Intravenous Once    Recommendations: 1. Agree with anidulafungin pending final identification of yeast 2. Repeat blood cultures 3. Consider stopping clindamycin   Assessment: [name] appears to have developed bladder outlet obstruction from an occluded Foley catheter complicated by a fungal UTI and fungemia. I agree with empiric anidulafungin pending identification of the yeast. I will repeat blood cultures now. It does not appear that she has any indwelling IV catheters at the time she was admitted. I doubt that she also has aspiration pneumonia and would consider stopping clindamycin now.   HPI: [name] is a 82 y.o. female with dementia who was recently hospitalized with acute renal insufficiency. She was discharged with a Foley catheter in place and readmitted 2 days ago with fever and decreased urine output. Her Foley catheter was removed in the emergency room and she immediately had increased urine output. Her urinalysis shows too numerous to count white blood cells and red blood cells.  Urine culture has grown greater than 100,000 yeast. Both sets of admission blood cultures are also growing yeast. Upon admission she was started on empiric antibiotics for possible aspiration pneumonia and urinary tract infection. Her therapy was changed to clindamycin and anidulafungin yesterday. She appears to be defervescing.  Review of Systems: Review of Systems  Unable to perform ROS Constitutional:       Review of systems not performed as this is a remote consultation.    Past Medical History  Diagnosis Date  . HTN (hypertension)   . Stroke (HCC)   . Diabetes mellitus without complication (HCC)   . Embolism (HCC)   . PVD (peripheral vascular disease) (HCC)   . Dementia   . Chronic kidney disease   . Neuromuscular dysfunction of bladder   . Extended spectrum beta lactamase (ESBL) resistance   . Hyperlipidemia   . Gastroesophageal reflux disease   . Dementia   . Plasma protein disorder   . Osteoporosis   . Pressure ulcer of left buttock   . Vitamin D deficiency   . Constipation   . Hypokalemia   . Retention of urine   . Peripheral vascular disease (HCC)   . Dysphagia     Social History  Substance Use Topics  . Smoking status: Never Smoker   . Smokeless tobacco: None  . Alcohol Use: No    Family History  Problem Relation Age of Onset  . Hypertension Other    Allergies  Allergen Reactions  . Penicillins     REACTION: unknown reaction: MAR reported    OBJECTIVE: Blood pressure 133/71, pulse 65, temperature 97 F (36.1 C), temperature source Axillary, resp. rate 25, height 5' (1.524 m), weight 113 lb 12.1 oz (51.6 kg), SpO2 99 %.  Physical Exam  Constitutional:  Physical exam not performed as this is a remote consultation.    Lab Results Lab Results  Component Value Date   WBC 8.3 07/27/2015   HGB 9.9* 07/27/2015   HCT 27.4* 07/27/2015   MCV 82.8 07/27/2015   PLT 171 07/27/2015    Lab Results  Component Value Date   CREATININE 4.90* 07/27/2015    BUN 60* 07/27/2015   NA 135 07/27/2015   K 2.7* 07/27/2015   CL 109 07/27/2015   CO2 17* 07/27/2015    Lab Results  Component Value Date   ALT 9* 07/26/2015   AST 23 07/26/2015   ALKPHOS 85 07/26/2015   BILITOT 0.5 07/26/2015     Microbiology: Recent Results (from the past 240 hour(s))  Urine culture     Status: Abnormal   Collection Time: 07/25/15  5:27 PM  Result Value Ref Range Status   Specimen Description URINE, CATHETERIZED  Final   Special Requests NONE  Final   Culture >=100,000 COLONIES/mL YEAST (A)  Final   Report Status 07/27/2015 FINAL  Final  Blood Culture (routine x 2)     Status: None (Preliminary result)   Collection Time: 07/25/15  6:05 PM  Result Value Ref Range Status   Specimen Description RIGHT ANTECUBITAL  Final   Special Requests BOTTLES DRAWN AEROBIC ONLY 6CC  Final   Culture  Setup Time   Final    YEAST Gram Stain Report Called to,Read Back By and Verified With: SPANGLER,E. AT 1823 ON 07/26/2015 BY AGUNDIZ,E.   Culture   Final    YEAST CULTURE REINCUBATED FOR BETTER GROWTH Performed at [facility]    Report Status PENDING  Incomplete  Blood Culture (routine x 2)     Status: None (Preliminary result)   Collection Time: 07/25/15  6:05 PM  Result Value Ref Range Status   Specimen Description RIGHT ANTECUBITAL  Final   Special Requests BOTTLES DRAWN AEROBIC ONLY 6CC  Final   Culture  Setup Time   Final    YEAST Gram Stain Report Called to,Read Back By and Verified With: SPANGLER,E. AT 1721 ON 07/26/2015 BY AGUNDIZ,E.   Culture   Final    YEAST CULTURE REINCUBATED FOR BETTER GROWTH Performed at [facility]    Report Status PENDING  Incomplete  MRSA PCR Screening     Status: None   Collection Time: 07/25/15 10:20 PM  Result Value Ref Range Status   MRSA by PCR NEGATIVE NEGATIVE Final    Comment:        The GeneXpert MRSA Assay (FDA approved for NASAL specimens only), is one component of a comprehensive MRSA  colonization surveillance program. It is not intended to diagnose MRSA infection nor to guide or monitor treatment for MRSA infections.     Cliffton Asters, MD [facility] [phone] pager   [phone] cell 07/27/2015, 12:16 PM

## 2015-07-27 NOTE — Progress Notes (Addendum)
Pt transferred to 300 unit rm 323. Pt's iv is patent, stable vital signs. Called daughter to update about mother's status and transfer. Gave report to RN.

## 2015-07-28 ENCOUNTER — Encounter (HOSPITAL_COMMUNITY): Payer: Self-pay | Admitting: Gastroenterology

## 2015-07-28 ENCOUNTER — Inpatient Hospital Stay (HOSPITAL_COMMUNITY): Payer: Medicare Other

## 2015-07-28 DIAGNOSIS — T83011A Breakdown (mechanical) of indwelling urethral catheter, initial encounter: Secondary | ICD-10-CM | POA: Insufficient documentation

## 2015-07-28 DIAGNOSIS — L8990 Pressure ulcer of unspecified site, unspecified stage: Secondary | ICD-10-CM

## 2015-07-28 DIAGNOSIS — B49 Unspecified mycosis: Secondary | ICD-10-CM

## 2015-07-28 DIAGNOSIS — N390 Urinary tract infection, site not specified: Secondary | ICD-10-CM

## 2015-07-28 DIAGNOSIS — J690 Pneumonitis due to inhalation of food and vomit: Secondary | ICD-10-CM

## 2015-07-28 DIAGNOSIS — N179 Acute kidney failure, unspecified: Secondary | ICD-10-CM | POA: Insufficient documentation

## 2015-07-28 DIAGNOSIS — Z515 Encounter for palliative care: Secondary | ICD-10-CM

## 2015-07-28 DIAGNOSIS — E46 Unspecified protein-calorie malnutrition: Secondary | ICD-10-CM

## 2015-07-28 LAB — GLUCOSE, CAPILLARY
GLUCOSE-CAPILLARY: 68 mg/dL (ref 65–99)
Glucose-Capillary: 72 mg/dL (ref 65–99)
Glucose-Capillary: 67 mg/dL (ref 65–99)
Glucose-Capillary: 142 mg/dL — ABNORMAL HIGH (ref 65–99)
Glucose-Capillary: 128 mg/dL — ABNORMAL HIGH (ref 65–99)
Glucose-Capillary: 110 mg/dL — ABNORMAL HIGH (ref 65–99)
Glucose-Capillary: 109 mg/dL — ABNORMAL HIGH (ref 65–99)

## 2015-07-28 LAB — RENAL FUNCTION PANEL
Albumin: 1.8 g/dL — ABNORMAL LOW (ref 3.5–5.0)
Anion gap: 11 (ref 5–15)
BUN: 47 mg/dL — AB (ref 6–20)
CHLORIDE: 108 mmol/L (ref 101–111)
CO2: 16 mmol/L — ABNORMAL LOW (ref 22–32)
CREATININE: 3.45 mg/dL — AB (ref 0.44–1.00)
Calcium: 7.2 mg/dL — ABNORMAL LOW (ref 8.9–10.3)
GFR calc Af Amer: 14 mL/min — ABNORMAL LOW (ref >60)
GFR, EST NON AFRICAN AMERICAN: 12 mL/min — AB (ref >60)
Glucose, Bld: 72 mg/dL (ref 65–99)
Phosphorus: 4.5 mg/dL (ref 2.5–4.6)
Potassium: 3.2 mmol/L — ABNORMAL LOW (ref 3.5–5.1)
Sodium: 135 mmol/L (ref 135–145)

## 2015-07-28 MED ORDER — DEXTROSE 50 % IV SOLN
25.0000 mL | Freq: Once | INTRAVENOUS | Status: AC
  Administered 2015-07-28: 25 mL via INTRAVENOUS

## 2015-07-28 MED ORDER — DEXTROSE 50 % IV SOLN
INTRAVENOUS | Status: AC
  Administered 2015-07-28: 50 mL
  Filled 2015-07-28: qty 50

## 2015-07-28 NOTE — Consult Note (Signed)
WOC wound consult note Reason for Consult: Pressure injury to right ischium and sacrum, present on admission.   Wound type:Pressure injury.  Dementia, bilateral BKA.  Pressure Ulcer POA: Yes Measurement:Right ischium 1 cm x 1 cm x 0.2 cm Sacrum 3 cm  X 3.2 cm x 0.4 cm  Erythema to groin area. Recommend keeping clean and dry. Will add Antimicrobial textile.  Wound ZOX:WRUEbed:Pink and moist Drainage (amount, consistency, odor) Minimal serosanguinous Periwound:Intact Dressing procedure/placement/frequency:Cleanse wounds to buttocks, sacrum with NS and pat gently dry.  Apply calcium alginate to wound bed.  Cover with Silicone border foam.  Change every 3 days and PRN soilage.   Interdry Ag to bilateral inguinal folds and groin area: Measure and cut length of InterDry Ag+ to fit in skin folds that have skin breakdown Tuck InterDry  Ag+ fabric into skin folds in a single layer, allow for 2 inches of overhang from skin edges to allow for wicking to occur May remove to bathe; dry area thoroughly and then tuck into affected areas again Do not apply any creams or ointments when using InterDry Ag+ DO NOT THROW AWAY FOR 5 DAYS unless soiled with stool DO NOT [facility] product, this will inactivate the silver in the material  New sheet of Interdry Ag+ should be applied after 5 days of use if patient continues to have skin breakdown   Will not follow at this time.  Please re-consult if needed.  Maple HudsonKaren Kreg Earhart RN BSN CWON Pager [phone]

## 2015-07-28 NOTE — Progress Notes (Signed)
Subjective: Interval History: none.  Objective: Vital signs in last 24 hours: Temp:  [97.7 F (36.5 C)-97.9 F (36.6 C)] 97.7 F (36.5 C) (05/15 0400) Pulse Rate:  [58-78] 71 (05/15 0400) Resp:  [17-32] 18 (05/15 0400) BP: (128-169)/(58-72) 134/60 mmHg (05/15 0400) SpO2:  [98 %-100 %] 100 % (05/15 0400) Weight:  [50.1 kg (110 lb 7.2 oz)] 50.1 kg (110 lb 7.2 oz) (05/15 0500) Weight change: -1.5 kg (-3 lb 4.9 oz)  Intake/Output from previous day: 05/14 0701 - 05/15 0700 In: -  Out: 4075 [Urine:4075] Intake/Output this shift: Total I/O In: -  Out: 650 [Urine:650]  General appearance: alert, cooperative and no distress Resp: clear to auscultation bilaterally Cardio: regular rate and rhythm, S1, S2 normal, no murmur, click, rub or gallop GI: soft, non-tender; bowel sounds normal; no masses,  no organomegaly Extremities: Bilateral BKA and no edema  Lab Results:  Recent Labs  07/26/15 0516 07/27/15 0455  WBC 12.4* 8.3  HGB 6.7* 9.9*  HCT 19.6* 27.4*  PLT 174 171   BMET:   Recent Labs  07/27/15 1147 07/28/15 0452  NA 134* 135  K 3.2* 3.2*  CL 109 108  CO2 16* 16*  GLUCOSE 94 72  BUN 57* 47*  CREATININE 4.35* 3.45*  CALCIUM 6.6* 7.2*   No results for input(s): PTH in the last 72 hours. Iron Studies: No results for input(s): IRON, TIBC, TRANSFERRIN, FERRITIN in the last 72 hours.  Studies/Results: No results found.  I have reviewed the patient's current medications.  Assessment/Plan: Problem #1 acute kidney injury superimposed on chronic.  Patient is nonoliguric with 4 L of urine output over the last 24 hours. Her renal function seems to be progressively improving. Problem #2 hypokalemia: Her potassium is low but stable.. Presently she is on potassium supplements. His IV fluid. Problem #3 low CO2: Presently improving Problem #4 diabetes Problem #5 history of pneumonia: Presently she is on antibiotics. She is a febrile and her white blood cell count is  normal. Problem #6 hypertension: Her blood pressure is reasonably controlled Problem #7 anemia: She is status post blood transfusion her hemoglobin has improved.  Problem #8 metabolic bone disease: Corrected calcium for albumin seems to be in normal range. Her phosphorus is slightly high. Plan: We'll increase IV fluid to 1 45 mL/m  will check renal panel in the morning.   LOS: 3 days   Ariel Dimitri S 07/28/2015,8:06 AM

## 2015-07-28 NOTE — Progress Notes (Signed)
Daily Progress Note   Patient Name: Sara GuarneriHelen W [name]       Date: 07/28/2015 DOB: 1933-07-07  Age: 82 y.o. MRN#: [id number] Attending Physician: Standley Brookinganiel P Goodrich, MD Primary Care Physician: Bernerd LimboARIZA,FERNANDO ENRIQUE, MD Admit Date: 07/25/2015  Reason for Consultation/Follow-up: Disposition, Establishing goals of care and Psychosocial/spiritual support  Subjective: [name] is resting quietly in bed, she makes brief eye contact, nonverbal responses. There is no family of bedside at this time.   Call to daughter [name], we talk about her mothers acute and chronic illnesses. We again discussed the dementia trajectory, and what is normal during this time. We talk about the results from her modified barium swallow (silent aspiration). [name] shares, "what about tube feeding". She shares that she would not want her mother to starve, "that's out". She shares her brothers experience in watching his father-in-law "starve to death", and it didn't look good. We talk about the realities of tube feeding including that it does not negate aspiration including aspiration of oral secretions/bacteria from poor dentitian. [name] asks for a G.I. consult regardless, stating that she wants her mother to have the best of care until her end.    She states that she wants her mother to have every opportunity at her final days.  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  We talk at length about the likelihood that [name] will pass from something else before she starves to death, and that if the gastroenterologist deems that she is safe for, and appropriate for  peg tube placement, [name] will no longer be able to eat. I ask [name] if [name] had ever shared what she would and would not want during these times. She shares that her father did share what he did and didn't want and that made it easier for her, but, [name] never talked about these issues.   [name] did asked today if the medical team was "considering hospice". I share that that is her decision but that we will support a transition to comfort care if that is her desire.   I share with [name] that her mother is being treated for fungus in  her urine, and the difficulties related to fungal treatments. Also shared that she is being treated for aspiration pneumonia at this time.  Length of Stay: 3  Current Medications: Scheduled Meds:  . anidulafungin  100 mg Intravenous Q24H  . clindamycin (CLEOCIN) IV  300 mg Intravenous Q8H  . heparin  5,000 Units Subcutaneous Q8H  . insulin aspart  0-9 Units Subcutaneous Q4H    Continuous Infusions: . sodium chloride 0.45 % with kcl 145 mL/hr at 07/28/15 0809    PRN Meds: acetaminophen **OR** acetaminophen, ondansetron **OR** ondansetron (ZOFRAN) IV  Physical Exam  Constitutional: No distress.  Thin and frail  HENT:  Head: Normocephalic and atraumatic.  Cardiovascular: Normal rate.   Pulmonary/Chest: Effort normal. No respiratory distress.  Abdominal: She exhibits no distension. There is no guarding.  Musculoskeletal:  amputation bilateral lower extremity  Neurological:  Makes brief eye contact, nonverbal responses only.  Skin: Skin is warm and dry.  Nursing note and vitals reviewed.           Vital Signs: BP 120/74 mmHg  Pulse 76  Temp(Src) 97.7 F (36.5 C) (Oral)  Resp 18  Ht 5' (1.524 m)  Wt 50.1 kg (110 lb 7.2 oz)  BMI 21.57 kg/m2  SpO2 99% SpO2: SpO2: 99 % O2 Device: O2 Device: Not Delivered O2 Flow Rate:    Intake/output summary:  Intake/Output Summary (Last 24 hours) at 07/28/15 1532 Last data filed at 07/28/15  1201  Gross per 24 hour  Intake      0 ml  Output   3250 ml  Net  -3250 ml   LBM:   Baseline Weight: Weight: 54.432 kg (120 lb) Most recent weight: Weight: 50.1 kg (110 lb 7.2 oz)       Palliative Assessment/Data:      Patient Active Problem List   Diagnosis Date Noted  . Fungemia 07/27/2015  . Aspiration pneumonia (HCC) 07/27/2015  . Sepsis (HCC) 07/26/2015  . Renal failure (ARF), acute on chronic (HCC) 07/25/2015  . Type 2 diabetes mellitus with renal manifestations (HCC) 07/25/2015  . Hypertension 07/25/2015  . Chronic anemia 07/25/2015  . HCAP (healthcare-associated pneumonia)   . Palliative care encounter   . Pressure ulcer 07/09/2015  . Metabolic acidosis 07/04/2015  . Lactic acidosis 07/04/2015  . Dementia   . Buttock wound   . Encephalopathy, metabolic Toxic 09/24/2013  . UTI (urinary tract infection) 09/21/2013  . DM (diabetes mellitus), type 2, uncontrolled, periph vascular complic (HCC) 04/22/2013  . Dehydration 04/21/2013  . Hyperosmolar non-ketotic state in patient with type 2 diabetes mellitus (HCC) 04/21/2013  . CAD (coronary artery disease) 04/21/2013  . Hyperglycemia 04/21/2013  . Dysphagia 04/21/2013  . h/o Stroke   . GERD (gastroesophageal reflux disease) 03/18/2011  . GASTROPARESIS 10/27/2009  . HYPERLIPIDEMIA 11/05/2008  . DEPRESSION 11/05/2008  . Essential hypertension 11/05/2008  . PERIPHERAL VASCULAR DISEASE 11/05/2008  . COPD 11/05/2008  . GI BLEEDING 11/05/2008  . NAUSEA AND VOMITING 11/05/2008    Palliative Care Assessment & Plan   Patient Profile: Ms. [name] is a 82 year old female with extensive past medical history she was recently hospitalized for 17 days, and returns within one week.  Assessment: as above  Recommendations/Plan:  daughter [name] is requesting G.I. consult for PEG tube placement.  continue to treat the treatable at this time, no extraordinary measures, CPR/intubation  Goals of Care and Additional  Recommendations:  Limitations on Scope of Treatment: Continue to treat the treatable at this time, no CPR/intubation.  Code Status:  Code Status Orders        Start     Ordered   07/25/15 2113  Do not attempt resuscitation (DNR)   Continuous    Question Answer Comment  In the event of cardiac or respiratory ARREST Do not call a "code blue"   In the event of cardiac or respiratory ARREST Do not perform Intubation, CPR, defibrillation or ACLS   In the event of cardiac or respiratory ARREST Use medication by any route, position, wound care, and other measures to relive pain and suffering. May use oxygen, suction and manual treatment of airway obstruction as needed for comfort.      07/25/15 2113    Code Status History    Date Active Date Inactive Code Status Order ID Comments User Context   07/04/2015 10:46 PM 07/21/2015  6:19 PM Full Code [id number]  Haydee Monica, MD Inpatient   09/21/2013  4:58 PM 09/24/2013  2:30 PM Full Code [id number]  Wilson Singer, MD ED   04/21/2013 11:12 PM 04/24/2013  9:02 PM Full Code [id number]  Haydee Monica, MD Inpatient       Prognosis:   < 3 months likely, based on continued declines and frailty, continued infections, aspiration.  Discharge Planning:  To Be Determined, likely return to [facility] skilled nursing facility per daughter [name]. [name] did asked today if the medical team was "considering hospice". I share that that is her decision but that we will support a transition to comfort care if that is her desire. We will continue to discuss options  Care plan was discussed with Case manager, social worker, Dr. Irene Limbo.  Thank you for allowing the Palliative Medicine Team to assist in the care of this patient.   Time In: 1500 Time Out: 1535 Total Time 35 minutes Prolonged Time Billed  no       Greater than 50%  of this time was spent counseling and coordinating care related to the above assessment and plan.  Dove,Tasha A, NP  Please  contact Palliative Medicine Team phone at [phone] for questions and concerns.

## 2015-07-28 NOTE — Progress Notes (Signed)
Hypoglycemic Event  CBG: 67  Treatment: 15 GM carbohydrate snack  Symptoms: None  Follow-up CBG: WUJW:[id number]:0845 CBG Result:141  Possible Reasons for Event: Inadequate meal intake  Comments/MD notified: Patient given 25 ml of D50 as she is NPO - MD paged and made aware    Laney PastorHurd, Leonard Feigel Nicole

## 2015-07-28 NOTE — Progress Notes (Signed)
SLP MBSS summary  Patient Details Name: [name] MRN: [id number] DOB: [name], [name]  MBSS completed this date and can now be found in the imaging section.   Summary:  Pt presents with moderate oropharyngeal phase dysphagia with suspected sensory and visible motor deficits characterized by poor labial closure, decreased lingual movement, decreased bolus cohesiveness with decreased anterior posterior transit across consistencies, premature spillage with delay in swallow initiation with swallow trigger after spilling to pyriforms with thins and nectars, deceased tongue base retraction and epiglottic deflection resulting in overall decreased pharyngeal pressure with decreased airway protection resulting in penetration and aspiration of thins during and after the swallow which was essentially silent in nature (no protective cough greater than 75% of the time). This also occurred with nectar-thick liquids, however to a lesser degree. Pt safest with puree texture and only resulted in vallecular residue (which was never completely cleared so poses risk for aspiration as well) and honey-thick liquids via teaspoon presentations. No penetration or aspiration observed with hone-thick liquids during or immediately after the swallow (which did occur with thin and NTL), but did result in vallecular residue (same of all texture/consistencies). Pt overall very weak and unable to follow directions 75% of the time so is a poor candidate for compensatory strategies besides what can be controlled by feeder. Recommend D1/puree with HTL (honey-thick liquids) presented via teaspoon presentations, however it should be understood that pt is at risk for aspiration, dehydration, and malnutrition due to the above. Risks for aspiration PNA can be reduced but not eliminated by ensuring good oral care, feeding pt in upright position when alert, and encouraging pt to swallow (benefits from visual prompt from feeder) several times.  Recommend palliative care consult due to chronic illness trajectory to help establish goals of care. Should pt/family choose comfort care if/when appropriate, consider offering thin liquids/ice chips after oral care for comfort if desired. I suspect feeding pt will be difficult as she required max cues for po intake this date. SLP will follow per goals of care. Please send Magic Cups for pt to try.   Impact on safety and function Severe aspiration risk;Risk for inadequate nutrition/hydration                 Thank you,  Havery MorosDabney Gabor Lusk, CCC-SLP [phone]  Juriel Cid 07/28/2015, 1:13 PM

## 2015-07-28 NOTE — Progress Notes (Signed)
Nutrition Follow-up  INTERVENTION:   Magic cup TID with meals, each supplement provides 290 kcal and 9 grams of protein   Milk (Honey-thick) with all meals  -NUTRITION DIAGNOSIS:  Inadequate oral intake related to swallow difficulty as evidenced by  Speech pathology evaluation and pt hx   GOAL:  Pt will meet needs based on pt family wishes for progression of care   MONITOR: Po intake, labs, skin assessment and wt trends      ASSESSMENT:  Per MD note history is significant of dementia, hypertension diabetes mellitus and chronic kidney disease and anemia who was recently admitted and discharged 4 days ago for acute renal failure and at that time renal failure was attributed to prerenal causes and also patient was on Lasix ACE inhibitor and had received gentamicin and at time of discharge patient's creatinine was 2.9 was found to have decreased urine output today with fever. Patient is demented and does not contribute to the history. As per patient's daughter patient did not have any nausea vomiting diarrhea chest pain or cough. Has been eating poorly for the last many days.  Based on hospital records she has severe weight loss in < 30 days? [facility] end of April. Question severity of changed based on visual exam. Recommend staff to re-weigh if feasible.  There is discussion with palliative care to help with goals of care and possibility of tube feeding has been approached. GI to assess?  [facility]; pt with stage III pressure injury to rt ischial tuberosity.    CSW following; plan to d/c back to [facility] once medically stable.    Recent Labs Lab 07/27/15 0454 07/27/15 0455 07/27/15 1147 07/28/15 0452  NA  --  135 134* 135  K  --  2.7* 3.2* 3.2*  CL  --  109 109 108  CO2  --  17* 16* 16*  BUN  --  60* 57* 47*  CREATININE  --  4.90* 4.35* 3.45*  CALCIUM  --  6.7* 6.6* 7.2*  MG 1.2*  --   --   --   PHOS  --  5.1*  --  4.5  GLUCOSE   --  98 94 72     Meds: insulin, Eraxin, clindamycin  Diet Order:  DIET - DYS 1 Room service appropriate?: Yes; Fluid consistency:: Honey Thick  Skin:   bilateral BKA . Skin is dry. Stage III on 4/22-resolved??  Last BM:   PTA  Height:   Ht Readings from Last 1 Encounters:  07/25/15 5' (1.524 m)    Weight:   Wt Readings from Last 1 Encounters:  07/28/15 110 lb 7.2 oz (50.1 kg)    BMI:  Body mass index is 21.57 kg/(m^2).  Estimated Nutritional Needs:   Kcal:   1400-1600  Protein:   40 gr  Fluid:   1.5 liters daily  EDUCATION NEEDS:   Royann ShiversLynn Letina Luckett MS,RD,CSG,LDN Office: [phone] Pager: [phone]

## 2015-07-28 NOTE — Clinical Social Work Note (Signed)
Clinical Social Work Assessment  Patient Details  Name: Sara GuarneriHelen W Leitz MRN: [id number] Date of Birth: 16-Nov-1933  Date of referral:  07/28/15               Reason for consult:  Facility Placement                Permission sought to share information with:  Family Supports, Magazine features editoracility Contact Representative Permission granted to share information::  Yes, Verbal Permission Granted  Name::     Kathlene NovemberMike or Idelle Leechamela Crossno  [phone]  Agency::  [facility] [place]  Relationship::  yes  Contact Information:  yes  Housing/Transportation Living arrangements for the past 2 months:  [facility] Source of Information:  Facility Patient Interpreter Needed:  None Criminal Activity/Legal Involvement Pertinent to Current Situation/Hospitalization:  No - Comment as needed Significant Relationships:  Adult Children Lives with:   [facility] Do you feel safe going back to the place where you live?  Yes Need for family participation in patient care:  Yes (Comment)  Care giving concerns:  TBD- Family has been called awaiting a call back   Office managerocial Worker assessment / plan:  LCSW collected information from patients charts and facility and nurses/care managers input. Patient resides at [facility] and is OK to return, she has dementia and is not oriented to person.place,situation at this time. Patient has been declining since April and requires full assistance with all her ADL's and is not mobile. Swallowing evaluation pending. She has a catheter and has low output.   Employment status:  Disabled (Comment on whether or not currently receiving Disability) Insurance information:  [place], [place] Recommendations:   Not assesses at this time Information / Referral to community resources:  Skilled Holiday representativeursing Facility, Other (Comment Required) (Palliative/comfort care)  Patient/Family's Response to care:  TBD- LCSW called and left message  Patient/Family's Understanding of and  Emotional Response to Diagnosis, Current Treatment, and Prognosis:  TBD awaiting call back ( Palliative and comfort care to be reviewed.  Emotional Assessment Appearance:  Appears stated age Attitude/Demeanor/Rapport:  Unable to Assess Affect (typically observed):  Calm, Unable to Assess Orientation:  Fluctuating Orientation (Suspected and/or reported Sundowners) (Demetia/orientation 0) Alcohol / Substance use:  Never Used Psych involvement (Current and /or in the community):  No (Comment)  Discharge Needs  Concerns to be addressed:  Care Coordination Readmission within the last 30 days:  No Current discharge risk:  Chronically ill Barriers to Discharge:  No Barriers Identified   Cheron SchaumannBandi, Khrystian Schauf M, LCSW 07/28/2015, 9:27 AM

## 2015-07-28 NOTE — Evaluation (Signed)
Clinical/Bedside Swallow Evaluation Patient Details  Name: [name] MRN: [id number] Date of Birth: 01-27-1934  Today's Date: 07/28/2015 Time: SLP Start Time (ACUTE ONLY): 1100 SLP Stop Time (ACUTE ONLY): 1131 SLP Time Calculation (min) (ACUTE ONLY): 31 min  Past Medical History:  Past Medical History  Diagnosis Date  . HTN (hypertension)   . Stroke (HCC)   . Diabetes mellitus without complication (HCC)   . Embolism (HCC)   . PVD (peripheral vascular disease) (HCC)   . Dementia   . Chronic kidney disease   . Neuromuscular dysfunction of bladder   . Extended spectrum beta lactamase (ESBL) resistance   . Hyperlipidemia   . Gastroesophageal reflux disease   . Dementia   . Plasma protein disorder   . Osteoporosis   . Pressure ulcer of left buttock   . Vitamin D deficiency   . Constipation   . Hypokalemia   . Retention of urine   . Peripheral vascular disease (HCC)   . Dysphagia    Past Surgical History:  Past Surgical History  Procedure Laterality Date  . Below knee leg amputation      left  . 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 Lump removed      from left breast  . S/p hysterectomy    . Cholecystectomy    . Esophagogastroduodenoscopy  09/23/08    SLF:no barrett's ring ,mass or stricture   HPI:  [name] is a 82 year old woman with dementia recently hospitalized for 17 days with discharge 5/8 for acute renal failure, gram-positive bacteremia, lactic acidosis who presented 5/12 with report of recurring fever. In the emergency department Foley catheter was replaced with a large amount of urine output. Admitted for acute kidney injury, suspected aspiration pneumonia with possible sepsis, UTI, acute on chronic anemia.Chest x-ray shows: The heart is enlarged. There are bibasilar opacities, left greater than right. The left hemidiaphragm is obscured. Suspect left pleural effusion. No pulmonary edema.Cardiomegaly without pulmonary  edema.Bilateral lower lobe infiltrates, left greater than right. SLP consulted for swallowing evaluation. Pt was seen by this SLP 2 years ago. Pt admitted from [facility] and treating SLP reports that pt was on puree diet with thin liquids.   Assessment / Plan / Recommendation Clinical Impression  Pt seen at bedside for clinical swallow evaluation. Pt alert, but has difficulty following simple commands (does follow some with model and delay). She appears non-verbal, but hums slightly. Oral cavity dry and with dried secretions along lips. Oral care completed as allowed (pt slightly resistant to toothette in mouth). Dentition in poor repair and pt turns head with facial grimmace when SLP completes oral care. Pt cognitively unable to complete lingual protrustion, cued cough, or cued swallow. She accepted single ice chips with decreased labial closure and SLP able to palpate laryngeal excursion with swallow. Pt demonstrated poor oral control of all textures/consistencies (thin, nectar, puree) and over coughing elicited after small straw sips thin water. Continue NPO until objective testing (MBSS) can be completed this afternoon with results to follow. Pt may benefit from palliative care consult due to chronic medical issues to help establish goals of care. Pt is certainly at risk for aspiration due to dependent feeder status, decreased cognition, and immobility (also at risk for aspiration pneumonia due to the above and dental decay). MBSS to be completed at 12:15 PM today with results to follow. Above to RN.     Aspiration Risk  Moderate aspiration risk  Diet Recommendation NPO (pending MBSS this date)        Other  Recommendations Oral Care Recommendations: Oral care QID;Staff/trained caregiver to provide oral care   Follow up Recommendations  24 hour supervision/assistance    Frequency and Duration min 2x/week  1 week       Prognosis Prognosis for Safe Diet Advancement: Fair Barriers to Reach  Goals: Cognitive deficits;Severity of deficits      Swallow Study   General Date of Onset: 07/25/15 HPI: [name] is a 82 year old woman with dementia recently hospitalized for 17 days with discharge 5/8 for acute renal failure, gram-positive bacteremia, lactic acidosis who presented 5/12 with report of recurring fever. In the emergency department Foley catheter was replaced with a large amount of urine output. Admitted for acute kidney injury, suspected aspiration pneumonia with possible sepsis, UTI, acute on chronic anemia.Chest x-ray shows: The heart is enlarged. There are bibasilar opacities, left greater than right. The left hemidiaphragm is obscured. Suspect left pleural effusion. No pulmonary edema.Cardiomegaly without pulmonary edema.Bilateral lower lobe infiltrates, left greater than right. SLP consulted for swallowing evaluation. Pt was seen by this SLP 2 years ago. Pt admitted from [facility] and treating SLP reports that pt was on puree diet with thin liquids. Type of Study: Bedside Swallow Evaluation Previous Swallow Assessment: 2015 reg/thin Diet Prior to this Study: NPO (PTA puree and thin) Temperature Spikes Noted: No Respiratory Status: Room air History of Recent Intubation: No Behavior/Cognition: Alert;Requires cueing Oral Cavity Assessment: Dry;Dried secretions Oral Care Completed by SLP: Yes Oral Cavity - Dentition: Poor condition Vision: Impaired for self-feeding Self-Feeding Abilities: Needs assist Patient Positioning: Upright in bed Baseline Vocal Quality: Normal (Pt only hums) Volitional Cough: Cognitively unable to elicit Volitional Swallow: Unable to elicit    Oral/Motor/Sensory Function Overall Oral Motor/Sensory Function: Moderate impairment (no gross facial asymmetry, generalized weakness) Facial ROM:  (open mouth breathing/resting posture) Facial Symmetry: Within Functional Limits Facial Strength: Reduced right;Reduced left Facial Sensation: Within Functional  Limits Lingual ROM: Reduced right;Reduced left;Suspected CN XII (hypoglossal) dysfunction Lingual Symmetry: Within Functional Limits Lingual Strength: Reduced;Suspected CN XII (hypoglossal) dysfunction Lingual Sensation: Within Functional Limits Velum:  (could not visualize) Mandible: Within Functional Limits   Ice Chips Ice chips: Impaired Presentation: Spoon Oral Phase Impairments: Reduced labial seal;Reduced lingual movement/coordination Oral Phase Functional Implications: Oral residue;Oral holding Pharyngeal Phase Impairments: Suspected delayed Swallow   Thin Liquid Thin Liquid: Impaired Presentation: Cup;Spoon;Straw Oral Phase Impairments: Reduced labial seal;Reduced lingual movement/coordination Oral Phase Functional Implications: Right anterior spillage;Left anterior spillage Pharyngeal  Phase Impairments: Suspected delayed Swallow;Decreased hyoid-laryngeal movement;Cough - Immediate (immediate cough after straw sip)    Nectar Thick Nectar Thick Liquid: Impaired Presentation: Cup;Straw Oral Phase Impairments: Reduced labial seal;Reduced lingual movement/coordination Pharyngeal Phase Impairments: Suspected delayed Swallow;Decreased hyoid-laryngeal movement   Honey Thick Honey Thick Liquid: Not tested   Puree Puree: Impaired Presentation: Spoon Oral Phase Impairments: Reduced labial seal;Reduced lingual movement/coordination Oral Phase Functional Implications: Prolonged oral transit   Solid   Thank you,  Havery MorosDabney Porter, CCC-SLP [phone]    Solid: Not tested        PORTER,DABNEY 07/28/2015,11:51 AM

## 2015-07-28 NOTE — Consult Note (Signed)
Referring Provider: Dr. Irene Limbo  Primary Care Physician:  Bernerd Limbo, MD Primary Gastroenterologist:  Dr. Darrick Penna   Date of Admission: 07/25/15 Date of Consultation: 07/28/15  Reason for Consultation:  Consideration for PEG tube placement.   HPI:  [name] is a 82 y.o. year old female with a history of dementia, previous prolonged hospitalization for acute renal failure, gram-positive bacteremia, lactic acidosis, who presented again in 07/25/15 with fever. She was admitted for acute renal injury, concern for aspiration pneumonia, UTI. She was found to have sepsis secondary to fungemia and has clinically improved since admission. Severe dysphagia noted with bedside clinical swallow evaluation followed by MBSS by Speech Pathology on 5/15. Patient had difficulty following simple commands during bedside evaluation. Found to have moderate oropharyngeal phase dysphagia with suspected sensory and visible motor deficits, weak, unable to safely perform compensatory strategies. Palliative care consult recommended. She is a severe aspiration risk. GI now consulted per family request. Family is not at bedside at time of consultation.   Patient last seen by our practice in 2013, previously followed for GERD and gastroparesis.   Past Medical History  Diagnosis Date  . HTN (hypertension)   . Stroke (HCC)   . Diabetes mellitus without complication (HCC)   . Embolism (HCC)   . PVD (peripheral vascular disease) (HCC)   . Dementia   . Chronic kidney disease   . Neuromuscular dysfunction of bladder   . Extended spectrum beta lactamase (ESBL) resistance   . Hyperlipidemia   . Gastroesophageal reflux disease   . Dementia   . Plasma protein disorder   . Osteoporosis   . Pressure ulcer of left buttock   . Vitamin D deficiency   . Constipation   . Hypokalemia   . Retention of urine   . Peripheral vascular disease (HCC)   . Dysphagia   . Gastroparesis     Past Surgical History   Procedure Laterality Date  . Below knee leg amputation      left  . 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 Lump removed      from left breast  . S/p hysterectomy    . Cholecystectomy    . Esophagogastroduodenoscopy  09/23/08    SLF:no barrett's ring ,mass or stricture    Prior to Admission medications   Medication Sig Start Date End Date Taking? Authorizing Provider  Amino Acids-Protein Hydrolys (FEEDING SUPPLEMENT, PRO-STAT SUGAR FREE 64,) LIQD Take 30 mLs by mouth 2 (two) times daily.   Yes Historical Provider, MD  amLODipine (NORVASC) 5 MG tablet Take 1 tablet (5 mg total) by mouth daily. 07/21/15  Yes Henderson Cloud, MD  aspirin EC 81 MG tablet Take 81 mg by mouth daily.   Yes Historical Provider, MD  cholecalciferol (VITAMIN D) 1000 UNITS tablet Take 2,000 Units by mouth daily.    Yes Historical Provider, MD  Cranberry 450 MG CAPS Take 450 mg by mouth daily.   Yes Historical Provider, MD  HYDROcodone-acetaminophen (NORCO/VICODIN) 5-325 MG tablet Take 1 tablet by mouth every 12 (twelve) hours. 07/21/15  Yes Estela Isaiah Blakes, MD  insulin aspart (NOVOLOG) 100 UNIT/ML injection Inject 2-5 Units into the skin 4 (four) times daily - after meals and at bedtime. Per sliding. 200-250= 2 units >70 call MD; 251-300 = 3 units; 301-350 = 4 units; 351-400 = 5 units; <400 call MD.   Yes Historical Provider, MD  lovastatin (MEVACOR) 40 MG tablet Take  60 mg by mouth at bedtime.    Yes Historical Provider, MD  metoprolol tartrate (LOPRESSOR) 25 MG tablet Take 1 tablet (25 mg total) by mouth 2 (two) times daily. 07/21/15  Yes Henderson Cloud, MD  omeprazole (PRILOSEC) 20 MG capsule Take 20 mg by mouth daily. 05/26/12  Yes Joselyn Arrow, NP  senna-docusate (SENNA PLUS) 8.6-50 MG tablet Take 1 tablet by mouth 2 (two) times daily.   Yes Historical Provider, MD    Current Facility-Administered Medications  Medication Dose Route Frequency Provider  Last Rate Last Dose  . acetaminophen (TYLENOL) tablet 650 mg  650 mg Oral Q6H PRN Eduard Clos, MD       Or  . acetaminophen (TYLENOL) suppository 650 mg  650 mg Rectal Q6H PRN Eduard Clos, MD      . anidulafungin (ERAXIS) 100 mg in sodium chloride 0.9 % 100 mL IVPB  100 mg Intravenous Q24H Standley Brooking, MD   100 mg at 07/28/15 1633  . clindamycin (CLEOCIN) IVPB 300 mg  300 mg Intravenous Q8H Standley Brooking, MD   300 mg at 07/28/15 1632  . heparin injection 5,000 Units  5,000 Units Subcutaneous Q8H Eduard Clos, MD   5,000 Units at 07/28/15 1626  . insulin aspart (novoLOG) injection 0-9 Units  0-9 Units Subcutaneous Q4H Eduard Clos, MD   1 Units at 07/28/15 1204  . ondansetron (ZOFRAN) tablet 4 mg  4 mg Oral Q6H PRN Eduard Clos, MD       Or  . ondansetron [facility]) injection 4 mg  4 mg Intravenous Q6H PRN Eduard Clos, MD      . sodium chloride 0.45 % 1,000 mL with potassium chloride 40 mEq infusion   Intravenous Continuous Salomon Mast, MD 145 mL/hr at 07/28/15 1633      Allergies as of 07/25/2015 - Review Complete 07/25/2015  Allergen Reaction Noted  . Penicillins      Family History  Problem Relation Age of Onset  . Hypertension Other     Social History   Social History  . Marital Status: Widowed    Spouse Name: N/A  . Number of Children: N/A  . Years of Education: N/A   Occupational History  . Not on file.   Social History Main Topics  . Smoking status: Never Smoker   . Smokeless tobacco: Not on file  . Alcohol Use: No  . Drug Use: No  . Sexual Activity: No   Other Topics Concern  . Not on file   Social History Narrative    Review of Systems: Unable to obtain. Non-verbal.   Physical Exam: Vital signs in last 24 hours: Temp:  [97.7 F (36.5 C)-97.9 F (36.6 C)] 97.7 F (36.5 C) (05/15 1323) Pulse Rate:  [71-78] 76 (05/15 1323) Resp:  [18-20] 18 (05/15 1323) BP: (120-169)/(60-74) 120/74 mmHg (05/15  1323) SpO2:  [99 %-100 %] 99 % (05/15 1323) Weight:  [110 lb 7.2 oz (50.1 kg)] 110 lb 7.2 oz (50.1 kg) (05/15 0500)   General:   Alert, unable to assess orientation, laying in bed with eyes open. Follows with eyes but no verbal response. Shakes head "no" when asked if in pain.  Head:  Normocephalic and atraumatic. Eyes:  Sclera clear, no icterus.   Conjunctiva pink. Neck:  Supple; no masses or thyromegaly. Lungs:  Clear to auscultation, diminished bases  Heart:  Regular rate and rhythm; systolic murmur noted  Abdomen:  Soft, nontender and nondistended.  No masses, hepatosplenomegaly or hernias noted. Normal bowel sounds, without guarding, and without rebound.   Rectal:  Deferred  Extremities:  Bilateral BKA Neurologic:  Alert, unable to assess orientation.    Intake/Output from previous day: 05/14 0701 - 05/15 0700 In: -  Out: 4075 [Urine:4075] Intake/Output this shift: Total I/O In: -  Out: 1250 [Urine:1250]  Lab Results:  Recent Labs  07/25/15 1805 07/26/15 0516 07/27/15 0455  WBC 20.3* 12.4* 8.3  HGB 7.9* 6.7* 9.9*  HCT 22.2* 19.6* 27.4*  PLT 196 174 171   BMET  Recent Labs  07/27/15 0455 07/27/15 1147 07/28/15 0452  NA 135 134* 135  K 2.7* 3.2* 3.2*  CL 109 109 108  CO2 17* 16* 16*  GLUCOSE 98 94 72  BUN 60* 57* 47*  CREATININE 4.90* 4.35* 3.45*  CALCIUM 6.7* 6.6* 7.2*   LFT  Recent Labs  07/25/15 1805 07/26/15 0516 07/27/15 0455 07/28/15 0452  PROT 5.6* 4.6*  --   --   ALBUMIN 1.9* 1.6* 1.6* 1.8*  AST 17 23  --   --   ALT 9* 9*  --   --   ALKPHOS 101 85  --   --   BILITOT 0.5 0.5  --   --    Studies/Results: Dg Swallowing Func-speech Pathology  07/28/2015  Dorene Ar, CCC-SLP     07/28/2015  1:12 PM Objective Swallowing Evaluation: Type of Study: MBS-Modified Barium Swallow Study Patient Details Name: [name] MRN: [id number] Date of Birth: 1933/11/03 Today's Date: 07/28/2015 Time: SLP Start Time (ACUTE ONLY): 1214-SLP Stop Time (ACUTE  ONLY): 1242 SLP Time Calculation (min) (ACUTE ONLY): 28 min Past Medical History: Past Medical History Diagnosis Date . HTN (hypertension)  . Stroke (HCC)  . Diabetes mellitus without complication (HCC)  . Embolism (HCC)  . PVD (peripheral vascular disease) (HCC)  . Dementia  . Chronic kidney disease  . Neuromuscular dysfunction of bladder  . Extended spectrum beta lactamase (ESBL) resistance  . Hyperlipidemia  . Gastroesophageal reflux disease  . Dementia  . Plasma protein disorder  . Osteoporosis  . Pressure ulcer of left buttock  . Vitamin D deficiency  . Constipation  . Hypokalemia  . Retention of urine  . Peripheral vascular disease (HCC)  . Dysphagia  Past Surgical History: Past Surgical History Procedure Laterality Date . Below knee leg amputation     left . 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 Lump removed     from left breast . S/p hysterectomy   . Cholecystectomy   . Esophagogastroduodenoscopy  09/23/08   SLF:no barrett's ring ,mass or stricture HPI: [name] is a 82 year old woman with dementia recently hospitalized for 17 days with discharge 5/8 for acute renal failure, gram-positive bacteremia, lactic acidosis who presented 5/12 with report of recurring fever. In the emergency department Foley catheter was replaced with a large amount of urine output. Admitted for acute kidney injury, suspected aspiration pneumonia with possible sepsis, UTI, acute on chronic anemia.Chest x-ray shows: The heart is enlarged. There are bibasilar opacities, left greater than right. The left hemidiaphragm is obscured. Suspect left pleural effusion. No pulmonary edema.Cardiomegaly without pulmonary edema.Bilateral lower lobe infiltrates, left greater than right. SLP consulted for swallowing evaluation. Pt was seen by this SLP 2 years ago. Pt admitted from [facility] and treating SLP reports that pt was on puree diet with thin liquids. Subjective: Pt nonverbal, humming in bed Assessment / Plan /  Recommendation CHL  IP CLINICAL IMPRESSIONS 07/28/2015 Therapy Diagnosis Moderate oral phase dysphagia;Severe oral phase dysphagia;Moderate pharyngeal phase dysphagia Clinical Impression Pt presents with moderate oropharyngeal phase dysphagia with suspected sensory and visible motor deficits characterized by poor labial closure, decreased lingual movement, decreased bolus cohesiveness with decreased anterior posterior transit across consistencies, premature spillage with delay in swallow initiation with swallow trigger after spilling to pyriforms with thins and nectars, deceased tongue base retraction and epiglottic deflection resulting in overall decreased pharyngeal pressure with decreased airway protection resulting in penetration and aspiration of thins during and after the swallow which was essentially silent in nature (no protective cough greater than 75% of the time). This also occurred with nectar-thick liquids, however to a lesser degree. Pt safest with puree texture and only resulted in vallecular residue (which was never completely cleared so poses risk for aspiration as well) and honey-thick liquids via teaspoon presentations. No penetration or aspiration observed with hone-thick liquids during or immediately after the swallow (which did occur with thin and NTL), but did result in vallecular residue (same of all texture/consistencies). Pt overall very weak and unable to follow directions 75% of the time so is a poor candidate for compensatory strategies besides what can be controlled by feeder. Recommend D1/puree with HTL (honey-thick liquids) presented via teaspoon presentations, however it should be understood that pt is at risk for aspiration, dehydration, and malnutrition due to the above. Risks for aspiration PNA can be reduced but not eliminated by ensuring good oral care, feeding pt in upright position when alert, and encouraging pt to swallow (benefits from visual prompt from feeder) several times.  Recommend palliative care consult due to chronic illness trajectory to help establish goals of care. Should pt/family choose comfort care if/when appropriate, consider offering thin liquids/ice chips after oral care for comfort if desired. I suspect feeding pt will be difficult as she required max cues for po intake this date. SLP will follow per goals of care. Please send Magic Cups for pt to try. Impact on safety and function Severe aspiration risk;Risk for inadequate nutrition/hydration   CHL IP TREATMENT RECOMMENDATION 07/28/2015 Treatment Recommendations Therapy as outlined in treatment plan below   Prognosis 07/28/2015 Prognosis for Safe Diet Advancement Guarded Barriers to Reach Goals Cognitive deficits;Severity of deficits Barriers/Prognosis Comment noted decline in recent months in function CHL IP DIET RECOMMENDATION 07/28/2015 SLP Diet Recommendations Dysphagia 1 (Puree) solids;Honey thick liquids Liquid Administration via Spoon Medication Administration Crushed with puree Compensations Slow rate;Small sips/bites;Lingual sweep for clearance of pocketing Postural Changes Remain semi-upright after after feeds/meals (Comment);Seated upright at 90 degrees   CHL IP OTHER RECOMMENDATIONS 07/28/2015 Recommended Consults --Consider palliative care consult to assist with goals of care given severity of dysphagia with aspiration risks in setting of dementia Oral Care Recommendations Oral care QID;Oral care before and after PO;Staff/trained caregiver to provide oral care Other Recommendations Order thickener from pharmacy;Prohibited food (jello, ice cream, thin soups);Clarify dietary restrictions   CHL IP FOLLOW UP RECOMMENDATIONS 07/28/2015 Follow up Recommendations 24 hour supervision/assistance   CHL IP FREQUENCY AND DURATION 07/28/2015 Speech Therapy Frequency (ACUTE ONLY) min 2x/week Treatment Duration 1 week      CHL IP ORAL PHASE 07/28/2015 Oral Phase Impaired Oral - Pudding Teaspoon -- Oral - Pudding Cup -- Oral -  Honey Teaspoon Weak lingual manipulation;Incomplete tongue to palate contact;Reduced posterior propulsion;Holding of bolus;Lingual/palatal residue;Delayed oral transit Oral - Honey Cup -- Oral - Nectar Teaspoon -- Oral - Nectar Cup Weak lingual manipulation;Incomplete tongue to palate contact;Reduced posterior propulsion;Holding of bolus;Lingual/palatal  residue;Piecemeal swallowing;Delayed oral transit;Premature spillage Oral - Nectar Straw Delayed oral transit Oral - Thin Teaspoon Weak lingual manipulation;Incomplete tongue to palate contact;Reduced posterior propulsion;Holding of bolus;Lingual/palatal residue;Piecemeal swallowing;Delayed oral transit;Premature spillage Oral - Thin Cup Weak lingual manipulation;Incomplete tongue to palate contact;Reduced posterior propulsion;Holding of bolus;Lingual/palatal residue;Piecemeal swallowing;Delayed oral transit;Premature spillage Oral - Thin Straw -- Oral - Puree Weak lingual manipulation;Incomplete tongue to palate contact;Reduced posterior propulsion;Holding of bolus;Lingual/palatal residue;Piecemeal swallowing;Delayed oral transit;Decreased bolus cohesion Oral - Mech Soft -- Oral - Regular -- Oral - Multi-Consistency -- Oral - Pill -- Oral Phase - Comment (No Data)  CHL IP PHARYNGEAL PHASE 07/28/2015 Pharyngeal Phase Impaired Pharyngeal- Pudding Teaspoon -- Pharyngeal -- Pharyngeal- Pudding Cup -- Pharyngeal -- Pharyngeal- Honey Teaspoon Delayed swallow initiation-vallecula;Reduced pharyngeal peristalsis;Reduced epiglottic inversion;Reduced anterior laryngeal mobility;Reduced laryngeal elevation;Reduced tongue base retraction;Penetration/Apiration after swallow;Pharyngeal residue - valleculae;Lateral channel residue Pharyngeal Material does not enter airway;Material enters airway, remains ABOVE vocal cords then ejected out Pharyngeal- Honey Cup -- Pharyngeal -- Pharyngeal- Nectar Teaspoon Delayed swallow initiation-pyriform sinuses;Reduced pharyngeal  peristalsis;Reduced epiglottic inversion;Reduced anterior laryngeal mobility;Reduced laryngeal elevation;Reduced airway/laryngeal closure;Reduced tongue base retraction;Penetration/Aspiration during swallow;Penetration/Apiration after swallow;Trace aspiration;Moderate aspiration;Pharyngeal residue - valleculae;Lateral channel residue Pharyngeal Material enters airway, passes BELOW cords without attempt by patient to eject out (silent aspiration);Material enters airway, remains ABOVE vocal cords and not ejected out Pharyngeal- Nectar Cup Delayed swallow initiation-pyriform sinuses;Reduced pharyngeal peristalsis;Reduced epiglottic inversion;Reduced anterior laryngeal mobility;Reduced laryngeal elevation;Reduced airway/laryngeal closure;Reduced tongue base retraction;Penetration/Aspiration during swallow;Penetration/Apiration after swallow;Trace aspiration;Moderate aspiration;Pharyngeal residue - valleculae;Pharyngeal residue - pyriform;Lateral channel residue Pharyngeal Material enters airway, passes BELOW cords without attempt by patient to eject out (silent aspiration);Material enters airway, remains ABOVE vocal cords and not ejected out Pharyngeal- Nectar Straw Delayed swallow initiation-pyriform sinuses;Reduced pharyngeal peristalsis;Reduced epiglottic inversion;Reduced anterior laryngeal mobility;Reduced laryngeal elevation;Reduced airway/laryngeal closure;Reduced tongue base retraction;Penetration/Aspiration during swallow;Penetration/Apiration after swallow;Moderate aspiration;Significant aspiration (Amount);Pharyngeal residue - valleculae;Pharyngeal residue - pyriform Pharyngeal Material enters airway, passes BELOW cords without attempt by patient to eject out (silent aspiration);Material enters airway, passes BELOW cords then ejected out;Material enters airway, remains ABOVE vocal cords and not ejected out Pharyngeal- Thin Teaspoon Delayed swallow initiation-pyriform sinuses;Reduced pharyngeal  peristalsis;Reduced epiglottic inversion;Reduced anterior laryngeal mobility;Reduced laryngeal elevation;Reduced airway/laryngeal closure;Reduced tongue base retraction;Penetration/Aspiration during swallow;Penetration/Apiration after swallow;Trace aspiration;Pharyngeal residue - valleculae;Pharyngeal residue - pyriform;Lateral channel residue Pharyngeal Material enters airway, passes BELOW cords without attempt by patient to eject out (silent aspiration);Material enters airway, remains ABOVE vocal cords then ejected out Pharyngeal- Thin Cup Delayed swallow initiation-pyriform sinuses;Reduced pharyngeal peristalsis;Reduced epiglottic inversion;Reduced anterior laryngeal mobility;Reduced laryngeal elevation;Reduced airway/laryngeal closure;Reduced tongue base retraction;Penetration/Aspiration during swallow;Penetration/Apiration after swallow;Moderate aspiration;Pharyngeal residue - valleculae;Pharyngeal residue - pyriform;Lateral channel residue Pharyngeal Material enters airway, passes BELOW cords without attempt by patient to eject out (silent aspiration) Pharyngeal- Thin Straw -- Pharyngeal -- Pharyngeal- Puree Delayed swallow initiation-vallecula;Reduced tongue base retraction;Pharyngeal residue - valleculae Pharyngeal -- Pharyngeal- Mechanical Soft -- Pharyngeal -- Pharyngeal- Regular -- Pharyngeal -- Pharyngeal- Multi-consistency -- Pharyngeal -- Pharyngeal- Pill -- Pharyngeal -- Pharyngeal Comment --  CHL IP CERVICAL ESOPHAGEAL PHASE 07/28/2015 Cervical Esophageal Phase WFL Pudding Teaspoon -- Pudding Cup -- Honey Teaspoon -- Honey Cup -- Nectar Teaspoon -- Nectar Cup -- Nectar Straw -- Thin Teaspoon -- Thin Cup -- Thin Straw -- Puree -- Mechanical Soft -- Regular -- Multi-consistency -- Pill -- Cervical Esophageal Comment -- No flowsheet data found. Thank you, Havery Moros, CCC-SLP [phone] PORTER,DABNEY 07/28/2015, 12:54 PM               Impression/Plan: 82 year old female with dementia and severe  aspiration risk, admitted with acute renal failure, fungemia, sepsis, and pneumonia. Family desires consideration for PEG tube at this time; however, no family is present at the bedside at time of consultation. I was unable to reach patient's daughter, Elita Quickam ([phone]) this afternoon for further discussion. A PEG tube is technically feasible in this scenario; however, it is not beneficial for this nice lady. It appears that family is desirous of all measures to be taken, after review of medical record documentation thus far. I will attempt to contact patient's daughter again tomorrow morning to review the risks and benefits. At this point, the risks far outweigh any benefits. Patient remains at significant risk for aspiration. Will discuss further with family tomorrow.    Nira RetortAnna W. Sams, ANP-BC [facility]     LOS: 3 days    07/28/2015, 5:03 PM

## 2015-07-28 NOTE — [place] FL2 (Signed)
[place] MEDICAID FL2 LEVEL OF CARE SCREENING TOOL     IDENTIFICATION  Patient Name: [name] Birthdate: 1934/01/09 Sex: female Admission Date (Current Location): 07/25/2015  [facility] [name]   Facility and Address:  [facility],  [address], [name]      Provider Number: [id number]  Attending Physician Name and Address:  Standley Brookinganiel P Goodrich, MD  Relative Name and Phone Number:       Current Level of Care: Hospital Recommended Level of Care: Skilled Nursing Facility Prior Approval Number:    Date Approved/Denied:   PASRR Number: [id number]  Discharge Plan: SNF    Current Diagnoses: Patient Active Problem List   Diagnosis Date Noted  . Fungemia 07/27/2015  . Aspiration pneumonia (HCC) 07/27/2015  . Sepsis (HCC) 07/26/2015  . Renal failure (ARF), acute on chronic (HCC) 07/25/2015  . Type 2 diabetes mellitus with renal manifestations (HCC) 07/25/2015  . Hypertension 07/25/2015  . Chronic anemia 07/25/2015  . HCAP (healthcare-associated pneumonia)   . Palliative care encounter   . Pressure ulcer 07/09/2015  . Metabolic acidosis 07/04/2015  . Lactic acidosis 07/04/2015  . Dementia   . Buttock wound   . Encephalopathy, metabolic Toxic 09/24/2013  . UTI (urinary tract infection) 09/21/2013  . DM (diabetes mellitus), type 2, uncontrolled, periph vascular complic (HCC) 04/22/2013  . Dehydration 04/21/2013  . Hyperosmolar non-ketotic state in patient with type 2 diabetes mellitus (HCC) 04/21/2013  . CAD (coronary artery disease) 04/21/2013  . Hyperglycemia 04/21/2013  . Dysphagia 04/21/2013  . h/o Stroke   . GERD (gastroesophageal reflux disease) 03/18/2011  . GASTROPARESIS 10/27/2009  . HYPERLIPIDEMIA 11/05/2008  . DEPRESSION 11/05/2008  . Essential hypertension 11/05/2008  . PERIPHERAL VASCULAR DISEASE 11/05/2008  . COPD 11/05/2008  . GI BLEEDING 11/05/2008  . NAUSEA AND VOMITING 11/05/2008     Orientation RESPIRATION BLADDER Height & Weight      (Dementia)    Indwelling catheter Weight: 110 lb 7.2 oz (50.1 kg) Height:  5' (152.4 cm)  BEHAVIORAL SYMPTOMS/MOOD NEUROLOGICAL BOWEL NUTRITION STATUS      Incontinent    AMBULATORY STATUS COMMUNICATION OF NEEDS Skin   Extensive Assist Verbally (incoherent) Normal                       Personal Care Assistance Level of Assistance  Bathing, Feeding, Dressing Bathing Assistance: Maximum assistance Feeding assistance: Maximum assistance Dressing Assistance: Maximum assistance     Functional Limitations Info    Sight Info: Impaired Hearing Info: Impaired Speech Info: Impaired    SPECIAL CARE FACTORS FREQUENCY                       Contractures      Additional Factors Info  Code Status Code Status Info: DNR Allergies Info: Penicilians           Current Medications (07/28/2015):  This is the current hospital active medication list Current Facility-Administered Medications  Medication Dose Route Frequency Provider Last Rate Last Dose  . acetaminophen (TYLENOL) tablet 650 mg  650 mg Oral Q6H PRN Eduard ClosArshad N Kakrakandy, MD       Or  . acetaminophen (TYLENOL) suppository 650 mg  650 mg Rectal Q6H PRN Eduard ClosArshad N Kakrakandy, MD      . anidulafungin (ERAXIS) 100 mg in sodium chloride 0.9 % 100 mL IVPB  100 mg Intravenous Q24H Standley Brookinganiel P Goodrich, MD   100 mg at 07/27/15 1843  .  clindamycin (CLEOCIN) IVPB 300 mg  300 mg Intravenous Q8H Standley Brooking, MD   300 mg at 07/28/15 0752  . heparin injection 5,000 Units  5,000 Units Subcutaneous Q8H Eduard Clos, MD   5,000 Units at 07/28/15 0600  . insulin aspart (novoLOG) injection 0-9 Units  0-9 Units Subcutaneous Q4H Eduard Clos, MD   1 Units at 07/26/15 [phone]  . ondansetron (ZOFRAN) tablet 4 mg  4 mg Oral Q6H PRN Eduard Clos, MD       Or  . ondansetron [facility]) injection 4 mg  4 mg Intravenous Q6H PRN Eduard Clos, MD      . sodium  chloride 0.45 % 1,000 mL with potassium chloride 40 mEq infusion   Intravenous Continuous Salomon Mast, MD 135 mL/hr at 07/27/15 1059       Discharge Medications: Please see discharge summary for a list of discharge medications.  Relevant Imaging Results:  Relevant Lab Results:   Additional Information SSN [id number]  Cheron Schaumann, [place]

## 2015-07-28 NOTE — Progress Notes (Signed)
PROGRESS NOTE  [name]:[id number] DOB: 28-Feb-1934 DOA: 07/25/2015 PCP: Bernerd Limbo, MD  Brief Narrative: 82 year old woman with dementia recently hospitalized for 17 days with discharge 5/8 for acute renal failure, gram-positive bacteremia, lactic acidosis who presented 5/12 with report of recurring fever. In the emergency department Foley catheter was replaced with a large amount of urine output. Admitted for acute kidney injury, suspected aspiration pneumonia with possible sepsis, UTI, acute on chronic anemia.  Assessment/Plan: 1. 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 Continues to improve daily. Excellent urine output. 2. Fungemia, fungal UTI catheter associated present on admission, no other foreign bodies or lines. Afebrile and vital signs are stable. 3. Sepsis secondary to fungemia, hemodynamics stable. 4. Bilateral pneumonia, favor aspiration. Clinically improving. Afebrile. Favor 5 day course of antibiotics. High risk for aspiration. 5. Severe dysphagia. D1/puree with HTL if feeding desired but felt to be very high-risk for aspiration. 6. Acute on chronic anemia, likely from CKD. Hgb 9.9, improved status post transfusion. No evidence of bleeding. Likely anemia of critical illness. Continue to monitor.  7. Diabetes mellitus type 2 with peripheral vascular disease. Remains stable.  8. Stage 3 decubitus ulcer on the scaral area and buttock. Present on admission.  Wound care consultation appreciated. 9. Dementia   She is afebrile and remains hemodynamically stable. However her clinical condition is guarded secondary to debility, dementia and recurrent aspiration.  Continue antifungal treatment pending culture data and short course antibiotics directed at aspiration.  Appreciate palliative care, we'll discuss with family as well. Patient at very high risk for aspiration with any food as  well as oral secretions. I would recommend against a feeding tube as it will not prevent aspiration and is not proven to be of benefit in patients with dementia. Hospice would be appropriate in my opinion.   DVT prophylaxis: Heparin Code Status: DNR Family Communication: No family bedside.  Disposition Plan: Return to skilled nursing facility if improves  Brendia Sacks, MD  [facility] Direct contact:  --Via amion app OR  --[email or web address]; password [email or web address] and click  7PM-7AM contact night coverage as above 07/28/2015, 7:38 AM  LOS: 3 days   Consultants:  Nephrology  ID remote  Speech therapy  Procedures:  1unit pRBC transfusion 5/13  Antimicrobials:  Aztreonam 5/13 >> 5/14  Clindamycin 5/13 >>  Eraxis 5/14 >>  HPI/Subjective: History unreliable, secondary to dementia.   Objective: Filed Vitals:   07/27/15 1500 07/27/15 2159 07/28/15 0400 07/28/15 0500  BP: 131/58 169/72 134/60   Pulse: 66 78 71   Temp:  97.9 F (36.6 C) 97.7 F (36.5 C)   TempSrc:  Axillary Axillary   Resp: Height:      Weight:    50.1 kg (110 lb 7.2 oz)  SpO2: 99% 99% 100%     Intake/Output Summary (Last 24 hours) at 07/28/15 0738 Last data filed at 07/28/15 0400  Gross per 24 hour  Intake      0 ml  Output   4075 ml  Net  -4075 ml     Filed Weights   07/26/15 0400 07/27/15 0500 07/28/15 0500  Weight: 52.7 kg (116 lb 2.9 oz) 51.6 kg (113 lb 12.1 oz) 50.1 kg (110 lb 7.2 oz)    Exam: .Constitutional:  Appears comfortable. Appears ill, acute and chronic. Respiratory:  CTA bilaterally, no w/r/r.  Respiratory effort normal. No retractions or accessory muscle use Cardiovascular:  RRR, no  m/r/g  Bilateral BKA , no edema Psychiatric:  Unable able to assess  I have personally reviewed following labs and imaging studies:  Potassium 3.2  CBG stable.   Creatinine 3.45/ BUN 16, Improving.    Scheduled Meds: . anidulafungin  100 mg Intravenous Q24H  .  clindamycin (CLEOCIN) IV  300 mg Intravenous Q8H  . heparin  5,000 Units Subcutaneous Q8H  . insulin aspart  0-9 Units Subcutaneous Q4H   Continuous Infusions: . sodium chloride 0.45 % with kcl 135 mL/hr at 07/27/15 1059    Principal Problem:   Fungemia Active Problems:   PERIPHERAL VASCULAR DISEASE   DM (diabetes mellitus), type 2, uncontrolled, periph vascular complic (HCC)   UTI (urinary tract infection)   Pressure ulcer   Renal failure (ARF), acute on chronic (HCC)   Type 2 diabetes mellitus with renal manifestations (HCC)   Hypertension   Chronic anemia   Sepsis (HCC)   Aspiration pneumonia (HCC)   LOS: 3 days   Time spent 25 minutes  By signing my name below, I, Zadie CleverlyJessica Augustus attest that this documentation has been prepared under the direction and in the presence of Brendia Sacksaniel Goodrich, MD Electronically signed: Zadie CleverlyJessica Augustus 07/28/2015 9:51am   I personally performed the services described in this documentation. All medical record entries made by the scribe were at my direction. I have reviewed the chart and agree that the record reflects my personal performance and is accurate and complete. Brendia Sacksaniel Goodrich, MD

## 2015-07-28 NOTE — Procedures (Signed)
Objective Swallowing Evaluation: Type of Study: MBS-Modified Barium Swallow Study  Patient Details  Name: [name] MRN: [id number] Date of Birth: June 11, 1933  Today's Date: 07/28/2015 Time: SLP Start Time (ACUTE ONLY): 1214-SLP Stop Time (ACUTE ONLY): 1242 SLP Time Calculation (min) (ACUTE ONLY): 28 min  Past Medical History:  Past Medical History  Diagnosis Date  . HTN (hypertension)   . Stroke (HCC)   . Diabetes mellitus without complication (HCC)   . Embolism (HCC)   . PVD (peripheral vascular disease) (HCC)   . Dementia   . Chronic kidney disease   . Neuromuscular dysfunction of bladder   . Extended spectrum beta lactamase (ESBL) resistance   . Hyperlipidemia   . Gastroesophageal reflux disease   . Dementia   . Plasma protein disorder   . Osteoporosis   . Pressure ulcer of left buttock   . Vitamin D deficiency   . Constipation   . Hypokalemia   . Retention of urine   . Peripheral vascular disease (HCC)   . Dysphagia    Past Surgical History:  Past Surgical History  Procedure Laterality Date  . Below knee leg amputation      left  . 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 Lump removed      from left breast  . S/p hysterectomy    . Cholecystectomy    . Esophagogastroduodenoscopy  09/23/08    SLF:no barrett's ring ,mass or stricture   HPI: [name] is a 82 year old woman with dementia recently hospitalized for 17 days with discharge 5/8 for acute renal failure, gram-positive bacteremia, lactic acidosis who presented 5/12 with report of recurring fever. In the emergency department Foley catheter was replaced with a large amount of urine output. Admitted for acute kidney injury, suspected aspiration pneumonia with possible sepsis, UTI, acute on chronic anemia.Chest x-ray shows: The heart is enlarged. There are bibasilar opacities, left greater than right. The left hemidiaphragm is obscured. Suspect left pleural effusion. No pulmonary  edema.Cardiomegaly without pulmonary edema.Bilateral lower lobe infiltrates, left greater than right. SLP consulted for swallowing evaluation. Pt was seen by this SLP 2 years ago. Pt admitted from [facility] and treating SLP reports that pt was on puree diet with thin liquids.  Subjective: Pt nonverbal, humming in bed  Assessment / Plan / Recommendation  CHL IP CLINICAL IMPRESSIONS 07/28/2015  Therapy Diagnosis Moderate oral phase dysphagia;Severe oral phase dysphagia;Moderate pharyngeal phase dysphagia  Clinical Impression Pt presents with moderate oropharyngeal phase dysphagia with suspected sensory and visible motor deficits characterized by poor labial closure, decreased lingual movement, decreased bolus cohesiveness with decreased anterior posterior transit across consistencies, premature spillage with delay in swallow initiation with swallow trigger after spilling to pyriforms with thins and nectars, deceased tongue base retraction and epiglottic deflection resulting in overall decreased pharyngeal pressure with decreased airway protection resulting in penetration and aspiration of thins during and after the swallow which was essentially silent in nature (no protective cough greater than 75% of the time). This also occurred with nectar-thick liquids, however to a lesser degree. Pt safest with puree texture and only resulted in vallecular residue (which was never completely cleared so poses risk for aspiration as well) and honey-thick liquids via teaspoon presentations. No penetration or aspiration observed with hone-thick liquids during or immediately after the swallow (which did occur with thin and NTL), but did result in vallecular residue (same of all texture/consistencies). Pt overall very weak and unable to follow directions  75% of the time so is a poor candidate for compensatory strategies besides what can be controlled by feeder. Recommend D1/puree with HTL (honey-thick liquids) presented via teaspoon  presentations, however it should be understood that pt is at risk for aspiration, dehydration, and malnutrition due to the above. Risks for aspiration PNA can be reduced but not eliminated by ensuring good oral care, feeding pt in upright position when alert, and encouraging pt to swallow (benefits from visual prompt from feeder) several times. Recommend palliative care consult due to chronic illness trajectory to help establish goals of care. Should pt/family choose comfort care if/when appropriate, consider offering thin liquids/ice chips after oral care for comfort if desired. I suspect feeding pt will be difficult as she required max cues for po intake this date. SLP will follow per goals of care. Please send Magic Cups for pt to try.  Impact on safety and function Severe aspiration risk;Risk for inadequate nutrition/hydration      CHL IP TREATMENT RECOMMENDATION 07/28/2015  Treatment Recommendations Therapy as outlined in treatment plan below     Prognosis 07/28/2015  Prognosis for Safe Diet Advancement Guarded  Barriers to Reach Goals Cognitive deficits;Severity of deficits  Barriers/Prognosis Comment noted decline in recent months in function    CHL IP DIET RECOMMENDATION 07/28/2015  SLP Diet Recommendations Dysphagia 1 (Puree) solids;Honey thick liquids  Liquid Administration via Spoon  Medication Administration Crushed with puree  Compensations Slow rate;Small sips/bites;Lingual sweep for clearance of pocketing  Postural Changes Remain semi-upright after after feeds/meals (Comment);Seated upright at 90 degrees      CHL IP OTHER RECOMMENDATIONS 07/28/2015  Recommended Consults --Consider palliative care consult to assist with goals of care given severity of dysphagia with aspiration risks in setting of dementia  Oral Care Recommendations Oral care QID;Oral care before and after PO;Staff/trained caregiver to provide oral care  Other Recommendations Order thickener from pharmacy;Prohibited  food (jello, ice cream, thin soups);Clarify dietary restrictions      CHL IP FOLLOW UP RECOMMENDATIONS 07/28/2015  Follow up Recommendations 24 hour supervision/assistance      CHL IP FREQUENCY AND DURATION 07/28/2015  Speech Therapy Frequency (ACUTE ONLY) min 2x/week  Treatment Duration 1 week           CHL IP ORAL PHASE 07/28/2015  Oral Phase Impaired  Oral - Pudding Teaspoon --  Oral - Pudding Cup --  Oral - Honey Teaspoon Weak lingual manipulation;Incomplete tongue to palate contact;Reduced posterior propulsion;Holding of bolus;Lingual/palatal residue;Delayed oral transit  Oral - Honey Cup --  Oral - Nectar Teaspoon --  Oral - Nectar Cup Weak lingual manipulation;Incomplete tongue to palate contact;Reduced posterior propulsion;Holding of bolus;Lingual/palatal residue;Piecemeal swallowing;Delayed oral transit;Premature spillage  Oral - Nectar Straw Delayed oral transit  Oral - Thin Teaspoon Weak lingual manipulation;Incomplete tongue to palate contact;Reduced posterior propulsion;Holding of bolus;Lingual/palatal residue;Piecemeal swallowing;Delayed oral transit;Premature spillage  Oral - Thin Cup Weak lingual manipulation;Incomplete tongue to palate contact;Reduced posterior propulsion;Holding of bolus;Lingual/palatal residue;Piecemeal swallowing;Delayed oral transit;Premature spillage  Oral - Thin Straw --  Oral - Puree Weak lingual manipulation;Incomplete tongue to palate contact;Reduced posterior propulsion;Holding of bolus;Lingual/palatal residue;Piecemeal swallowing;Delayed oral transit;Decreased bolus cohesion  Oral - Mech Soft --  Oral - Regular --  Oral - Multi-Consistency --  Oral - Pill --  Oral Phase - Comment (No Data)    CHL IP PHARYNGEAL PHASE 07/28/2015  Pharyngeal Phase Impaired  Pharyngeal- Pudding Teaspoon --  Pharyngeal --  Pharyngeal- Pudding Cup --  Pharyngeal --  Pharyngeal- Honey Teaspoon Delayed swallow initiation-vallecula;Reduced pharyngeal  peristalsis;Reduced epiglottic inversion;Reduced anterior laryngeal mobility;Reduced laryngeal elevation;Reduced tongue base retraction;Penetration/Apiration after swallow;Pharyngeal residue - valleculae;Lateral channel residue  Pharyngeal Material does not enter airway;Material enters airway, remains ABOVE vocal cords then ejected out  Pharyngeal- Honey Cup --  Pharyngeal --  Pharyngeal- Nectar Teaspoon Delayed swallow initiation-pyriform sinuses;Reduced pharyngeal peristalsis;Reduced epiglottic inversion;Reduced anterior laryngeal mobility;Reduced laryngeal elevation;Reduced airway/laryngeal closure;Reduced tongue base retraction;Penetration/Aspiration during swallow;Penetration/Apiration after swallow;Trace aspiration;Moderate aspiration;Pharyngeal residue - valleculae;Lateral channel residue  Pharyngeal Material enters airway, passes BELOW cords without attempt by patient to eject out (silent aspiration);Material enters airway, remains ABOVE vocal cords and not ejected out  Pharyngeal- Nectar Cup Delayed swallow initiation-pyriform sinuses;Reduced pharyngeal peristalsis;Reduced epiglottic inversion;Reduced anterior laryngeal mobility;Reduced laryngeal elevation;Reduced airway/laryngeal closure;Reduced tongue base retraction;Penetration/Aspiration during swallow;Penetration/Apiration after swallow;Trace aspiration;Moderate aspiration;Pharyngeal residue - valleculae;Pharyngeal residue - pyriform;Lateral channel residue  Pharyngeal Material enters airway, passes BELOW cords without attempt by patient to eject out (silent aspiration);Material enters airway, remains ABOVE vocal cords and not ejected out  Pharyngeal- Nectar Straw Delayed swallow initiation-pyriform sinuses;Reduced pharyngeal peristalsis;Reduced epiglottic inversion;Reduced anterior laryngeal mobility;Reduced laryngeal elevation;Reduced airway/laryngeal closure;Reduced tongue base retraction;Penetration/Aspiration during  swallow;Penetration/Apiration after swallow;Moderate aspiration;Significant aspiration (Amount);Pharyngeal residue - valleculae;Pharyngeal residue - pyriform  Pharyngeal Material enters airway, passes BELOW cords without attempt by patient to eject out (silent aspiration);Material enters airway, passes BELOW cords then ejected out;Material enters airway, remains ABOVE vocal cords and not ejected out  Pharyngeal- Thin Teaspoon Delayed swallow initiation-pyriform sinuses;Reduced pharyngeal peristalsis;Reduced epiglottic inversion;Reduced anterior laryngeal mobility;Reduced laryngeal elevation;Reduced airway/laryngeal closure;Reduced tongue base retraction;Penetration/Aspiration during swallow;Penetration/Apiration after swallow;Trace aspiration;Pharyngeal residue - valleculae;Pharyngeal residue - pyriform;Lateral channel residue  Pharyngeal Material enters airway, passes BELOW cords without attempt by patient to eject out (silent aspiration);Material enters airway, remains ABOVE vocal cords then ejected out  Pharyngeal- Thin Cup Delayed swallow initiation-pyriform sinuses;Reduced pharyngeal peristalsis;Reduced epiglottic inversion;Reduced anterior laryngeal mobility;Reduced laryngeal elevation;Reduced airway/laryngeal closure;Reduced tongue base retraction;Penetration/Aspiration during swallow;Penetration/Apiration after swallow;Moderate aspiration;Pharyngeal residue - valleculae;Pharyngeal residue - pyriform;Lateral channel residue  Pharyngeal Material enters airway, passes BELOW cords without attempt by patient to eject out (silent aspiration)  Pharyngeal- Thin Straw --  Pharyngeal --  Pharyngeal- Puree Delayed swallow initiation-vallecula;Reduced tongue base retraction;Pharyngeal residue - valleculae  Pharyngeal --  Pharyngeal- Mechanical Soft --  Pharyngeal --  Pharyngeal- Regular --  Pharyngeal --  Pharyngeal- Multi-consistency --  Pharyngeal --  Pharyngeal- Pill --  Pharyngeal --  Pharyngeal  Comment --     CHL IP CERVICAL ESOPHAGEAL PHASE 07/28/2015  Cervical Esophageal Phase WFL  Pudding Teaspoon --  Pudding Cup --  Honey Teaspoon --  Honey Cup --  Nectar Teaspoon --  Nectar Cup --  Nectar Straw --  Thin Teaspoon --  Thin Cup --  Thin Straw --  Puree --  Mechanical Soft --  Regular --  Multi-consistency --  Pill --  Cervical Esophageal Comment --    No flowsheet data found. Thank you,  [name], CCC-SLP [phone]  [name] 07/28/2015, 12:54 PM

## 2015-07-28 NOTE — Care Management Note (Signed)
Case Management Note  Patient Details  Name: Sara GuarneriHelen W [name] MRN: [id number] Date of Birth: Oct 01, 1933  Subjective/Objective:                    Action/Plan: Anticipate patient discharge back to SNF possibly with hospice.    Expected Discharge Date:  07/28/15               Expected Discharge Plan:  Skilled Nursing Facility  In-House Referral:  Clinical Social Work, Hospice / Palliative Care  Discharge planning Services  CM Consult  Post Acute Care Choice:    Choice offered to:     DME Arranged:    DME Agency:     HH Arranged:    HH Agency:     Status of Service:  In process, will continue to follow  Medicare Important Message Given:  Yes Date Medicare IM Given:    Medicare IM give by:    Date Additional Medicare IM Given:    Additional Medicare Important Message give by:     If discussed at Long Length of Stay Meetings, dates discussed:    Additional Comments:  Adonis HugueninBerkhead, Mika Anastasi L, RN 07/28/2015, 12:18 PM

## 2015-07-28 NOTE — Care Management Important Message (Signed)
Important Message  Patient Details  Name: Sara GuarneriHelen W [name] MRN: [id number] Date of Birth: Jun 15, 1933   Medicare Important Message Given:  Yes    Adonis HugueninBerkhead, Dimitrious Micciche L, RN 07/28/2015, 9:13 AM

## 2015-07-29 DIAGNOSIS — Z7189 Other specified counseling: Secondary | ICD-10-CM

## 2015-07-29 DIAGNOSIS — B377 Candidal sepsis: Secondary | ICD-10-CM

## 2015-07-29 LAB — GLUCOSE, CAPILLARY
GLUCOSE-CAPILLARY: 91 mg/dL (ref 65–99)
GLUCOSE-CAPILLARY: 93 mg/dL (ref 65–99)
GLUCOSE-CAPILLARY: 75 mg/dL (ref 65–99)
GLUCOSE-CAPILLARY: 67 mg/dL (ref 65–99)
Glucose-Capillary: 115 mg/dL — ABNORMAL HIGH (ref 65–99)
Glucose-Capillary: 86 mg/dL (ref 65–99)
Glucose-Capillary: 89 mg/dL (ref 65–99)

## 2015-07-29 LAB — RENAL FUNCTION PANEL
Albumin: 1.8 g/dL — ABNORMAL LOW (ref 3.5–5.0)
Anion gap: 11 (ref 5–15)
BUN: 33 mg/dL — ABNORMAL HIGH (ref 6–20)
CHLORIDE: 109 mmol/L (ref 101–111)
CO2: 16 mmol/L — AB (ref 22–32)
CREATININE: 2.44 mg/dL — AB (ref 0.44–1.00)
Calcium: 7.4 mg/dL — ABNORMAL LOW (ref 8.9–10.3)
GFR calc non Af Amer: 18 mL/min — ABNORMAL LOW (ref >60)
GFR, EST AFRICAN AMERICAN: 21 mL/min — AB (ref >60)
GLUCOSE: 90 mg/dL (ref 65–99)
Phosphorus: 3.1 mg/dL (ref 2.5–4.6)
Potassium: 3.6 mmol/L (ref 3.5–5.1)
SODIUM: 136 mmol/L (ref 135–145)

## 2015-07-29 LAB — CULTURE, BLOOD (ROUTINE X 2)

## 2015-07-29 MED ORDER — FLUCONAZOLE IN SODIUM CHLORIDE 200-0.9 MG/100ML-% IV SOLN
200.0000 mg | INTRAVENOUS | Status: DC
  Filled 2015-07-29: qty 100

## 2015-07-29 MED ORDER — DEXTROSE 50 % IV SOLN
INTRAVENOUS | Status: AC
  Administered 2015-07-29: 25 mL
  Filled 2015-07-29: qty 50

## 2015-07-29 MED ORDER — FLUCONAZOLE IN SODIUM CHLORIDE 400-0.9 MG/200ML-% IV SOLN
400.0000 mg | Freq: Once | INTRAVENOUS | Status: AC
  Administered 2015-07-29: 400 mg via INTRAVENOUS
  Filled 2015-07-29: qty 200

## 2015-07-29 NOTE — Progress Notes (Addendum)
[facility], this morning. I discussed with her that we advised against a PEG tube, and that the risks outweighed any possible benefit. Pam stated that she sat with her mom last night and asked "yes and no" questions. When asking her mom if she wanted a PEG placement, daughter states she shook her head "no". Pam states she feels "at peace" now with this and understood that we would be signing off. I communicated this briefly to Lillia Carmelasha Dove, NP, with palliative care.  Nira RetortAnna W. Sams, ANP-BC [facility]    Patient seen and examined this afternoon. No family members present. No further recommendations at this time.

## 2015-07-29 NOTE — Progress Notes (Signed)
Hypoglycemic Event  CBG: 67  Treatment: D50 IV 25 mL  Symptoms: None  Follow-up CBG: Time: 1724 CBG Result:  115  Possible Reasons for Event: Inadequate meal intake  Comments/MD notified:  Dr. Irene LimboGoodrich on unit and notified.    Dorene GrebeHAWKINS, Doctor Sheahan

## 2015-07-29 NOTE — Progress Notes (Signed)
Patient ID: Sara GuarneriHelen W [name], female   DOB: [birth year], 82 y.o.   MRN: [id number]         [facility]    Date of Admission:  07/25/2015   Total days of antibiotics 5        Day 4 anidulafungin and clindamycin  She is now afebrile and repeat blood cultures are negative at 48 hours. I recommend continuing anidulafungin pending final identification of yeast in admission blood cultures. I would consider stopping clindamycin soon. It appears that her daughter ring is considering transition to comfort measures only.         Cliffton AstersJohn Ericha Whittingham, MD [facility] [phone] pager   [phone] cell 03/18/2015, 1:32 PM

## 2015-07-29 NOTE — Clinical Social Work Note (Signed)
CSW received call from pt's daughter Larita FifeLynn. She reports pt has not been eating or drinking much. Pt is at high risk for aspiration. PEG tube discussed by GI and daughter has chosen not to pursue this. Palliative to follow up later today. CSW will continue to follow.   Derenda FennelKara Acie Custis, LCSW [phone]

## 2015-07-29 NOTE — Progress Notes (Signed)
Patient ID: Sara GuarneriHelen W [name], female   DOB: [birth year], 82 y.o.   MRN: [id number]         [facility]    Date of Admission:  07/25/2015     Admission blood cultures have grown Candida tropicalis which is usually sensitive to fluconazole. I will give her a renally adjusted loading dose of 400 milligrams then 200 mg daily. I will give this intravenously but if she is able to take oral medications she could be switched to oral fluconazole. She needs at least 14 days of therapy following her first negative blood culture. So far, repeat blood cultures on 07/27/2015 are negative. If her daughter decides to continue her on therapy it would be ideal for her to have an ophthalmologic exam to rule out fungal endophthalmitis which would impact duration of therapy and management. Please call if I can be of further assistance         Sara AstersJohn Anica Alcaraz, MD [facility] [phone] pager   [phone] cell 03/18/2015, 1:32 PM

## 2015-07-29 NOTE — Progress Notes (Signed)
Daily Progress Note   Patient Name: CATALIA MASSETT       Date: 07/29/2015 DOB: 1933/08/28  Age: 82 y.o. MRN#: [id number] Attending Physician: Standley Brooking, MD Primary Care Physician: Bernerd Limbo, MD Admit Date: 07/25/2015  Reason for Consultation/Follow-up: Disposition, Establishing goals of care and Psychosocial/spiritual support  Subjective: Mrs. Sano is resting quietly in bed. She makes and briefly keeps eye contact with me.  She has no response when I ask if she is having pain. I offer her some for lunch tray, but she does not open her mouth.  Call to daughter [name], we talk about her visit with her mother yesterday evening. [name] shares that she asked her mother a series of yes and no questions to make sure Mrs. Fairbairn understood what [name] was asking. [name] shares that when she asked Mrs. Orellana about 2 feedings she immediately shook her head no. We talk also about the infectious disease doctor's report and suggestion of continuing fluconazole by mouth if possible for 14 days. Also share his recommendation that Mrs. Larita Fife follow-up with an eye doctor. [name] states that she feels that her mother would be unable to participate in this exam and therefore declines to have follow-up with ophthalmologist.    [name] states that she continues to have concerned about her mother starving. I share that at end stages that the desire to eat leaves the body. I share that we continue to offer food and drink, and at this point we offer her favorite foods. We talk about disposition, and that Mrs. Scales will likely return to [facility] skilled nursing home in the next day or two.  [name] shares that she was not asked to complete an MOST form upon return to the skilled nursing home.   I share that I will  leave a MOST form for her in her mother's room; she and I can review this form tomorrow. I talk about the concepts of do not treat the next infection, do not re-hospitalize, let nature take its course.  Length of Stay: 4  Current Medications: Scheduled Meds:  . [START ON 07/30/2015] fluconazole (DIFLUCAN) IV  200 mg Intravenous Q24H  . heparin  5,000 Units Subcutaneous Q8H  . insulin aspart  0-9 Units Subcutaneous Q4H    Continuous Infusions: . sodium chloride 0.45 % with kcl  125 mL/hr at 07/29/15 0949    PRN Meds: acetaminophen **OR** acetaminophen, ondansetron **OR** ondansetron (ZOFRAN) IV  Physical Exam  Constitutional: No distress.  Frail and thin  HENT:  Head: Normocephalic and atraumatic.  Cardiovascular: Normal rate.   Pulmonary/Chest: Effort normal.  Abdominal: Soft.  Neurological:  Unable to determine orientation, makes and keeps brief eye contact  Skin: Skin is warm and dry.            Vital Signs: BP 167/71 mmHg  Pulse 88  Temp(Src) 98 F (36.7 C) (Axillary)  Resp 18  Ht 5' (1.524 m)  Wt 50.21 kg (110 lb 11.1 oz)  BMI 21.62 kg/m2  SpO2 99% SpO2: SpO2: 99 % O2 Device: O2 Device: Not Delivered O2 Flow Rate:    Intake/output summary:  Intake/Output Summary (Last 24 hours) at 07/29/15 1647 Last data filed at 07/29/15 1200  Gross per 24 hour  Intake 6589.33 ml  Output   3000 ml  Net 3589.33 ml   LBM:   Baseline Weight: Weight: 54.432 kg (120 lb) Most recent weight: Weight: 50.21 kg (110 lb 11.1 oz)       Palliative Assessment/Data:      Patient Active Problem List   Diagnosis Date Noted  . Acute renal failure (HCC)   . Malfunction of Foley catheter (HCC)   . Malnourished (HCC)   . Fungemia 07/27/2015  . Renal failure (ARF), acute on chronic (HCC) 07/25/2015  . Type 2 diabetes mellitus with renal manifestations (HCC) 07/25/2015  . Hypertension 07/25/2015  . Chronic anemia 07/25/2015  . HCAP (healthcare-associated pneumonia)   . Palliative  care encounter   . Pressure ulcer 07/09/2015  . Metabolic acidosis 07/04/2015  . Lactic acidosis 07/04/2015  . Dementia   . Buttock wound   . Encephalopathy, metabolic Toxic 09/24/2013  . UTI (urinary tract infection) 09/21/2013  . DM (diabetes mellitus), type 2, uncontrolled, periph vascular complic (HCC) 04/22/2013  . Dehydration 04/21/2013  . Hyperosmolar non-ketotic state in patient with type 2 diabetes mellitus (HCC) 04/21/2013  . CAD (coronary artery disease) 04/21/2013  . Hyperglycemia 04/21/2013  . Dysphagia 04/21/2013  . h/o Stroke   . GERD (gastroesophageal reflux disease) 03/18/2011  . GASTROPARESIS 10/27/2009  . HYPERLIPIDEMIA 11/05/2008  . DEPRESSION 11/05/2008  . Essential hypertension 11/05/2008  . PERIPHERAL VASCULAR DISEASE 11/05/2008  . COPD 11/05/2008  . GI BLEEDING 11/05/2008  . NAUSEA AND VOMITING 11/05/2008    Palliative Care Assessment & Plan   Patient Profile: [name] is a 82 year old female patient with an extensive history including stroke prescriptive peripheral vascular disease with bilateral lower extremity amputations and middle to late stage dementia. She's had recurrent hospitalizations for worsening kidney function and infection.  Assessment: as above  Recommendations/Plan:  continue to treat the treatable, but no extraordinary measures.  Goals of Care and Additional Recommendations:  Limitations on Scope of Treatment: No CPR, no intubation/ventilation, no peg tube.  Code Status:    Code Status Orders        Start     Ordered   07/25/15 2113  Do not attempt resuscitation (DNR)   Continuous    Question Answer Comment  In the event of cardiac or respiratory ARREST Do not call a "code blue"   In the event of cardiac or respiratory ARREST Do not perform Intubation, CPR, defibrillation or ACLS   In the event of cardiac or respiratory ARREST Use medication by any route, position, wound care, and other measures to relive pain and  suffering.  May use oxygen, suction and manual treatment of airway obstruction as needed for comfort.      07/25/15 2113    Code Status History    Date Active Date Inactive Code Status Order ID Comments User Context   07/04/2015 10:46 PM 07/21/2015  6:19 PM Full Code [id number]  Haydee Monica, MD Inpatient   09/21/2013  4:58 PM 09/24/2013  2:30 PM Full Code [id number]  Wilson Singer, MD ED   04/21/2013 11:12 PM 04/24/2013  9:02 PM Full Code [id number]  Haydee Monica, MD Inpatient       Prognosis:   < 6 weeks likely, based on recurrent infections, recurrent kidney injury, decreased PO intake, and overall functional decline.  Discharge Planning:  Skilled Nursing Facility for rehab with Palliative care service follow-up  Care plan was discussed with Nursing staff, case manager, social worker, and Dr. Irene Limbo on next rounds.  Thank you for allowing the Palliative Medicine Team to assist in the care of this patient.   Time In: 1505 Time Out: 1540 Total Time 35 minutes  Prolonged Time Billed  no       Greater than 50%  of this time was spent counseling and coordinating care related to the above assessment and plan.  Myquan Schaumburg A, NP  Please contact Palliative Medicine Team phone at [phone] for questions and concerns.

## 2015-07-29 NOTE — Progress Notes (Signed)
Subjective: Interval History: Patient with severe dementia presently doesn't, Very well  Objective: Vital signs in last 24 hours: Temp:  [97.7 F (36.5 C)-98.1 F (36.7 C)] 97.9 F (36.6 C) (05/16 0637) Pulse Rate:  [68-76] 74 (05/16 0637) Resp:  [18-20] 18 (05/16 0637) BP: (120-144)/(66-76) 132/66 mmHg (05/16 0637) SpO2:  [96 %-99 %] 96 % (05/16 0637) Weight:  [50.21 kg (110 lb 11.1 oz)] 50.21 kg (110 lb 11.1 oz) (05/16 1308) Weight change: 0.11 kg (3.9 oz)  Intake/Output from previous day: 05/15 0701 - 05/16 0700 In: 6589.3 [I.V.:6139.3; IV Piggyback:450] Out: 4250 [Urine:4250] Intake/Output this shift:    Generally patient is alert and in no apparent distress Chest is clear to auscultation Heart exam revealed regular rate and rhythm no murmur Extremities she has bilateral BKA and no edema  Lab Results:  Recent Labs  07/27/15 0455  WBC 8.3  HGB 9.9*  HCT 27.4*  PLT 171   BMET:   Recent Labs  07/28/15 0452 07/29/15 0424  NA 135 136  K 3.2* 3.6  CL 108 109  CO2 16* 16*  GLUCOSE 72 90  BUN 47* 33*  CREATININE 3.45* 2.44*  CALCIUM 7.2* 7.4*   No results for input(s): PTH in the last 72 hours. Iron Studies: No results for input(s): IRON, TIBC, TRANSFERRIN, FERRITIN in the last 72 hours.  Studies/Results: Dg Swallowing Func-speech Pathology  07/28/2015  Dorene Ar, CCC-SLP     07/28/2015  1:12 PM Objective Swallowing Evaluation: Type of Study: MBS-Modified Barium Swallow Study Patient Details Name: [name] MRN: [id number] Date of Birth: Jul 17, 1933 Today's Date: 07/28/2015 Time: SLP Start Time (ACUTE ONLY): 1214-SLP Stop Time (ACUTE ONLY): 1242 SLP Time Calculation (min) (ACUTE ONLY): 28 min Past Medical History: Past Medical History Diagnosis Date . HTN (hypertension)  . Stroke (HCC)  . Diabetes mellitus without complication (HCC)  . Embolism (HCC)  . PVD (peripheral vascular disease) (HCC)  . Dementia  . Chronic kidney disease  . Neuromuscular dysfunction  of bladder  . Extended spectrum beta lactamase (ESBL) resistance  . Hyperlipidemia  . Gastroesophageal reflux disease  . Dementia  . Plasma protein disorder  . Osteoporosis  . Pressure ulcer of left buttock  . Vitamin D deficiency  . Constipation  . Hypokalemia  . Retention of urine  . Peripheral vascular disease (HCC)  . Dysphagia  Past Surgical History: Past Surgical History Procedure Laterality Date . Below knee leg amputation     left . 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 Lump removed     from left breast . S/p hysterectomy   . Cholecystectomy   . Esophagogastroduodenoscopy  09/23/08   SLF:no barrett's ring ,mass or stricture HPI: [name] is a 82 year old woman with dementia recently hospitalized for 17 days with discharge 5/8 for acute renal failure, gram-positive bacteremia, lactic acidosis who presented 5/12 with report of recurring fever. In the emergency department Foley catheter was replaced with a large amount of urine output. Admitted for acute kidney injury, suspected aspiration pneumonia with possible sepsis, UTI, acute on chronic anemia.Chest x-ray shows: The heart is enlarged. There are bibasilar opacities, left greater than right. The left hemidiaphragm is obscured. Suspect left pleural effusion. No pulmonary edema.Cardiomegaly without pulmonary edema.Bilateral lower lobe infiltrates, left greater than right. SLP consulted for swallowing evaluation. Pt was seen by this SLP 2 years ago. Pt admitted from [facility] and treating SLP reports that pt was on puree diet with thin liquids.  Subjective: Pt nonverbal, humming in bed Assessment / Plan / Recommendation CHL IP CLINICAL IMPRESSIONS 07/28/2015 Therapy Diagnosis Moderate oral phase dysphagia;Severe oral phase dysphagia;Moderate pharyngeal phase dysphagia Clinical Impression Pt presents with moderate oropharyngeal phase dysphagia with suspected sensory and visible motor deficits characterized by poor labial closure,  decreased lingual movement, decreased bolus cohesiveness with decreased anterior posterior transit across consistencies, premature spillage with delay in swallow initiation with swallow trigger after spilling to pyriforms with thins and nectars, deceased tongue base retraction and epiglottic deflection resulting in overall decreased pharyngeal pressure with decreased airway protection resulting in penetration and aspiration of thins during and after the swallow which was essentially silent in nature (no protective cough greater than 75% of the time). This also occurred with nectar-thick liquids, however to a lesser degree. Pt safest with puree texture and only resulted in vallecular residue (which was never completely cleared so poses risk for aspiration as well) and honey-thick liquids via teaspoon presentations. No penetration or aspiration observed with hone-thick liquids during or immediately after the swallow (which did occur with thin and NTL), but did result in vallecular residue (same of all texture/consistencies). Pt overall very weak and unable to follow directions 75% of the time so is a poor candidate for compensatory strategies besides what can be controlled by feeder. Recommend D1/puree with HTL (honey-thick liquids) presented via teaspoon presentations, however it should be understood that pt is at risk for aspiration, dehydration, and malnutrition due to the above. Risks for aspiration PNA can be reduced but not eliminated by ensuring good oral care, feeding pt in upright position when alert, and encouraging pt to swallow (benefits from visual prompt from feeder) several times. Recommend palliative care consult due to chronic illness trajectory to help establish goals of care. Should pt/family choose comfort care if/when appropriate, consider offering thin liquids/ice chips after oral care for comfort if desired. I suspect feeding pt will be difficult as she required max cues for po intake this date.  SLP will follow per goals of care. Please send Magic Cups for pt to try. Impact on safety and function Severe aspiration risk;Risk for inadequate nutrition/hydration   CHL IP TREATMENT RECOMMENDATION 07/28/2015 Treatment Recommendations Therapy as outlined in treatment plan below   Prognosis 07/28/2015 Prognosis for Safe Diet Advancement Guarded Barriers to Reach Goals Cognitive deficits;Severity of deficits Barriers/Prognosis Comment noted decline in recent months in function CHL IP DIET RECOMMENDATION 07/28/2015 SLP Diet Recommendations Dysphagia 1 (Puree) solids;Honey thick liquids Liquid Administration via Spoon Medication Administration Crushed with puree Compensations Slow rate;Small sips/bites;Lingual sweep for clearance of pocketing Postural Changes Remain semi-upright after after feeds/meals (Comment);Seated upright at 90 degrees   CHL IP OTHER RECOMMENDATIONS 07/28/2015 Recommended Consults --Consider palliative care consult to assist with goals of care given severity of dysphagia with aspiration risks in setting of dementia Oral Care Recommendations Oral care QID;Oral care before and after PO;Staff/trained caregiver to provide oral care Other Recommendations Order thickener from pharmacy;Prohibited food (jello, ice cream, thin soups);Clarify dietary restrictions   CHL IP FOLLOW UP RECOMMENDATIONS 07/28/2015 Follow up Recommendations 24 hour supervision/assistance   CHL IP FREQUENCY AND DURATION 07/28/2015 Speech Therapy Frequency (ACUTE ONLY) min 2x/week Treatment Duration 1 week      CHL IP ORAL PHASE 07/28/2015 Oral Phase Impaired Oral - Pudding Teaspoon -- Oral - Pudding Cup -- Oral - Honey Teaspoon Weak lingual manipulation;Incomplete tongue to palate contact;Reduced posterior propulsion;Holding of bolus;Lingual/palatal residue;Delayed oral transit Oral - Honey Cup -- Oral - Nectar Teaspoon -- Oral - Nectar  Cup Weak lingual manipulation;Incomplete tongue to palate contact;Reduced posterior propulsion;Holding  of bolus;Lingual/palatal residue;Piecemeal swallowing;Delayed oral transit;Premature spillage Oral - Nectar Straw Delayed oral transit Oral - Thin Teaspoon Weak lingual manipulation;Incomplete tongue to palate contact;Reduced posterior propulsion;Holding of bolus;Lingual/palatal residue;Piecemeal swallowing;Delayed oral transit;Premature spillage Oral - Thin Cup Weak lingual manipulation;Incomplete tongue to palate contact;Reduced posterior propulsion;Holding of bolus;Lingual/palatal residue;Piecemeal swallowing;Delayed oral transit;Premature spillage Oral - Thin Straw -- Oral - Puree Weak lingual manipulation;Incomplete tongue to palate contact;Reduced posterior propulsion;Holding of bolus;Lingual/palatal residue;Piecemeal swallowing;Delayed oral transit;Decreased bolus cohesion Oral - Mech Soft -- Oral - Regular -- Oral - Multi-Consistency -- Oral - Pill -- Oral Phase - Comment (No Data)  CHL IP PHARYNGEAL PHASE 07/28/2015 Pharyngeal Phase Impaired Pharyngeal- Pudding Teaspoon -- Pharyngeal -- Pharyngeal- Pudding Cup -- Pharyngeal -- Pharyngeal- Honey Teaspoon Delayed swallow initiation-vallecula;Reduced pharyngeal peristalsis;Reduced epiglottic inversion;Reduced anterior laryngeal mobility;Reduced laryngeal elevation;Reduced tongue base retraction;Penetration/Apiration after swallow;Pharyngeal residue - valleculae;Lateral channel residue Pharyngeal Material does not enter airway;Material enters airway, remains ABOVE vocal cords then ejected out Pharyngeal- Honey Cup -- Pharyngeal -- Pharyngeal- Nectar Teaspoon Delayed swallow initiation-pyriform sinuses;Reduced pharyngeal peristalsis;Reduced epiglottic inversion;Reduced anterior laryngeal mobility;Reduced laryngeal elevation;Reduced airway/laryngeal closure;Reduced tongue base retraction;Penetration/Aspiration during swallow;Penetration/Apiration after swallow;Trace aspiration;Moderate aspiration;Pharyngeal residue - valleculae;Lateral channel residue Pharyngeal  Material enters airway, passes BELOW cords without attempt by patient to eject out (silent aspiration);Material enters airway, remains ABOVE vocal cords and not ejected out Pharyngeal- Nectar Cup Delayed swallow initiation-pyriform sinuses;Reduced pharyngeal peristalsis;Reduced epiglottic inversion;Reduced anterior laryngeal mobility;Reduced laryngeal elevation;Reduced airway/laryngeal closure;Reduced tongue base retraction;Penetration/Aspiration during swallow;Penetration/Apiration after swallow;Trace aspiration;Moderate aspiration;Pharyngeal residue - valleculae;Pharyngeal residue - pyriform;Lateral channel residue Pharyngeal Material enters airway, passes BELOW cords without attempt by patient to eject out (silent aspiration);Material enters airway, remains ABOVE vocal cords and not ejected out Pharyngeal- Nectar Straw Delayed swallow initiation-pyriform sinuses;Reduced pharyngeal peristalsis;Reduced epiglottic inversion;Reduced anterior laryngeal mobility;Reduced laryngeal elevation;Reduced airway/laryngeal closure;Reduced tongue base retraction;Penetration/Aspiration during swallow;Penetration/Apiration after swallow;Moderate aspiration;Significant aspiration (Amount);Pharyngeal residue - valleculae;Pharyngeal residue - pyriform Pharyngeal Material enters airway, passes BELOW cords without attempt by patient to eject out (silent aspiration);Material enters airway, passes BELOW cords then ejected out;Material enters airway, remains ABOVE vocal cords and not ejected out Pharyngeal- Thin Teaspoon Delayed swallow initiation-pyriform sinuses;Reduced pharyngeal peristalsis;Reduced epiglottic inversion;Reduced anterior laryngeal mobility;Reduced laryngeal elevation;Reduced airway/laryngeal closure;Reduced tongue base retraction;Penetration/Aspiration during swallow;Penetration/Apiration after swallow;Trace aspiration;Pharyngeal residue - valleculae;Pharyngeal residue - pyriform;Lateral channel residue Pharyngeal  Material enters airway, passes BELOW cords without attempt by patient to eject out (silent aspiration);Material enters airway, remains ABOVE vocal cords then ejected out Pharyngeal- Thin Cup Delayed swallow initiation-pyriform sinuses;Reduced pharyngeal peristalsis;Reduced epiglottic inversion;Reduced anterior laryngeal mobility;Reduced laryngeal elevation;Reduced airway/laryngeal closure;Reduced tongue base retraction;Penetration/Aspiration during swallow;Penetration/Apiration after swallow;Moderate aspiration;Pharyngeal residue - valleculae;Pharyngeal residue - pyriform;Lateral channel residue Pharyngeal Material enters airway, passes BELOW cords without attempt by patient to eject out (silent aspiration) Pharyngeal- Thin Straw -- Pharyngeal -- Pharyngeal- Puree Delayed swallow initiation-vallecula;Reduced tongue base retraction;Pharyngeal residue - valleculae Pharyngeal -- Pharyngeal- Mechanical Soft -- Pharyngeal -- Pharyngeal- Regular -- Pharyngeal -- Pharyngeal- Multi-consistency -- Pharyngeal -- Pharyngeal- Pill -- Pharyngeal -- Pharyngeal Comment --  CHL IP CERVICAL ESOPHAGEAL PHASE 07/28/2015 Cervical Esophageal Phase WFL Pudding Teaspoon -- Pudding Cup -- Honey Teaspoon -- Honey Cup -- Nectar Teaspoon -- Nectar Cup -- Nectar Straw -- Thin Teaspoon -- Thin Cup -- Thin Straw -- Puree -- Mechanical Soft -- Regular -- Multi-consistency -- Pill -- Cervical Esophageal Comment -- No flowsheet data found. Thank you, Havery Moros, CCC-SLP [phone] PORTER,DABNEY 07/28/2015, 12:54 PM  I have reviewed the patient's current medications.  Assessment/Plan: Problem #1 acute kidney injury superimposed on chronic. Her renal function continue to improve progressively. Patient has 4200 mL of urine output the last 24 hours. Problem #2 hypokalemia: Patient is on potassium supplement her potassium is normal Problem #3 low CO2: Presently stable Problem #4 diabetes: Her blood sugar is reasonably  controlled Problem #5 history of pneumonia: Presently she is on antibiotics. She is a febrile and her white blood cell count is normal. Problem #6 hypertension: Her blood pressure is reasonably controlled Problem #7 anemia: She is status post blood transfusion. Her hemoglobin is stable Problem #8 metabolic bone disease: Most of her calcium and phosphorus is within normal range at this moment. Plan: 1] We'll decrease IV fluid to 125 mL/m  2] will check renal panel in the morning.   LOS: 4 days   Kirsten Mckone S 07/29/2015,8:50 AM

## 2015-07-29 NOTE — Progress Notes (Addendum)
PROGRESS NOTE  Sara GuarneriHelen W Stoneking ZOX:[id number] DOB: 1933-05-03 DOA: 07/25/2015 PCP: Bernerd LimboARIZA,Sara ENRIQUE, MD  Brief Narrative: 82 year old woman with dementia recently hospitalized for 17 days with discharge 5/8 for acute renal failure, gram-positive bacteremia, lactic acidosis who presented 5/12 with report of recurring fever. In the emergency department Foley catheter was replaced with a large amount of urine output. Admitted for acute kidney injury, suspected aspiration pneumonia with possible sepsis, UTI, acute on chronic anemia. Subsequently found to have fungemia and fungal UTI, treated with empiric therapy, narrowed to Diflucan in consultation with infectious disease. Renal function continues to improve and fungemia appears to be resolving. Aspiration pneumonia appears resolved, antibiotics discontinued. Patient has severe dysphagia and after GI consultation and further discussion, the family has decided against PEG. Anticipate transfer back to skilled nursing facility within 48 hours on oral Diflucan.  Assessment/Plan: 1. 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 Excellent urine output, improving daily.  2. Candida tropicalis UTI and fungemia. Remains afebrile with stable vital signs. Per infectious disease, minimum 14 day therapy from first negative blood culture. If daughter desires, it would be ideal to obtain outpatient ophthalmology exam to exclude fungal endophthalmitis. However, her clinical exam suggests that she will not be able to tolerate transport to an office and undergoing an exam. 3. Sepsis secondary to fungemia, resolved. Repeat blood cultures pending, no growth today. 4. Bilateral pneumonia, favor aspiration. Appears resolved. Stop clindamycin today. 5. Severe dysphagia. D1/puree with HTL if feeding desired but felt to be very high-risk for aspiration. Appreciate GI involvement, daughter has decided  against PEG tube placement, concur. 6. Acute on chronic anemia, likely from CKD. Improved status post transfusion. No evidence of bleeding. Likely anemia of critical illness. Continue to monitor.  7. Diabetes mellitus type 2 with peripheral vascular disease. Remains stable.  8. Stage 3 decubitus ulcer on the scaral area and buttock. Present on admission. Wound care consultation appreciated. 9. Dementia   Overall improving in regard to renal function. She remains afebrile. Agree with infectious disease recommendations, consult appreciated. Agree with GI recommendations against PEG tube.   Continue management of acute kidney injury per nephrology.  Stop clindamycin.  Can wtich to oral fluconazole on discharge.  We'll discuss with palliative care  DVT prophylaxis: Heparin Code Status: DNR Family Communication: No family bedside. Discussed with daughter [name] by telephone. Disposition Plan: Return to skilled nursing facility if improves  Brendia Sacksaniel Guerin Lashomb, MD  [facility] Direct contact:  --Via amion app OR  --[email or web address]; password [email or web address] and click  7PM-7AM contact night coverage as above 07/29/2015, 7:11 AM  LOS: 4 days   Consultants:  Nephrology  ID remote  Speech therapy  Wound care  Procedures:  1unit pRBC transfusion 5/13  Antimicrobials:  Aztreonam 5/13 >> 5/14  Clindamycin 5/13 >> 5/16  Eraxis 5/14 >> 5/16  Fluconazole 5/16 >> 5/27 (or longer if  is found to have fungal endophthalmitis)  HPI/Subjective: History unobtainable, secondary to dementia.   Objective: Filed Vitals:   07/28/15 1323 07/28/15 2012 07/28/15 2127 07/29/15 0637  BP: 120/74  144/76 132/66  Pulse: 76  68 74  Temp: 97.7 F (36.5 C)  98.1 F (36.7 C) 97.9 F (36.6 C)  TempSrc: Oral  Oral Axillary  Resp: 18  20 18   Height:      Weight:    50.21 kg (110 lb 11.1 oz)  SpO2: 99% 99% 97% 96%    Intake/Output Summary (Last 24 hours) at  07/29/15 0711 Last data filed at  07/29/15 0647  Gross per 24 hour  Intake 6589.33 ml  Output   4250 ml  Net 2339.33 ml     Filed Weights   07/27/15 0500 07/28/15 0500 07/29/15 0637  Weight: 51.6 kg (113 lb 12.1 oz) 50.1 kg (110 lb 7.2 oz) 50.21 kg (110 lb 11.1 oz)    Exam:  Constitutional:  Appears uncomfortable, somewhat restless. Chronically ill  Respiratory:  . CTA bilaterally, no w/r/r.  . Respiratory effort normal. No retractions or accessory muscle use Cardiovascular:  . RRR, no r/g. 2/6 systolic murmer . No bilateral BKA edema Abdomen:  . Some bruising; no tenderness or masses Psychiatric:  o Cannot assess  I have personally reviewed following labs and imaging studies:  Urine output 4250  Potassium normal. BUN, creatinine continue to rapidly improved.  Albumin 1.8  Blood sugars stable Fungal cx noted  Scheduled Meds: . anidulafungin  100 mg Intravenous Q24H  . clindamycin (CLEOCIN) IV  300 mg Intravenous Q8H  . heparin  5,000 Units Subcutaneous Q8H  . insulin aspart  0-9 Units Subcutaneous Q4H   Continuous Infusions: . sodium chloride 0.45 % with kcl 145 mL/hr at 07/29/15 0132    Principal Problem:   Fungemia Active Problems:   PERIPHERAL VASCULAR DISEASE   DM (diabetes mellitus), type 2, uncontrolled, periph vascular complic (HCC)   UTI (urinary tract infection)   Pressure ulcer   Renal failure (ARF), acute on chronic (HCC)   Type 2 diabetes mellitus with renal manifestations (HCC)   Hypertension   Chronic anemia   Sepsis (HCC)   Aspiration pneumonia (HCC)   Acute renal failure (HCC)   Malfunction of Foley catheter (HCC)   Urinary tract infectious disease   Malnourished (HCC)   LOS: 4 days   Time spent 15 minutes  By signing my name below, I, Adron Bene, attest that this documentation has been prepared under the direction and in the presence of Kariss Longmire P. Irene Limbo, MD. Electronically Signed: Adron Bene, Scribe.  07/29/2015 10:45am  I personally performed  the services described in this documentation. All medical record entries made by the scribe were at my direction. I have reviewed the chart and agree that the record reflects my personal performance and is accurate and complete. Brendia Sacks, MD

## 2015-07-30 LAB — RENAL FUNCTION PANEL
ANION GAP: 8 (ref 5–15)
Albumin: 1.9 g/dL — ABNORMAL LOW (ref 3.5–5.0)
BUN: 25 mg/dL — ABNORMAL HIGH (ref 6–20)
CHLORIDE: 108 mmol/L (ref 101–111)
CO2: 18 mmol/L — ABNORMAL LOW (ref 22–32)
Calcium: 7.2 mg/dL — ABNORMAL LOW (ref 8.9–10.3)
Creatinine, Ser: 1.82 mg/dL — ABNORMAL HIGH (ref 0.44–1.00)
GFR, EST AFRICAN AMERICAN: 29 mL/min — AB (ref >60)
GFR, EST NON AFRICAN AMERICAN: 25 mL/min — AB (ref >60)
Glucose, Bld: 83 mg/dL (ref 65–99)
POTASSIUM: 3.4 mmol/L — AB (ref 3.5–5.1)
Phosphorus: 2.3 mg/dL — ABNORMAL LOW (ref 2.5–4.6)
Sodium: 134 mmol/L — ABNORMAL LOW (ref 135–145)

## 2015-07-30 LAB — GLUCOSE, CAPILLARY
GLUCOSE-CAPILLARY: 80 mg/dL (ref 65–99)
GLUCOSE-CAPILLARY: 65 mg/dL (ref 65–99)
GLUCOSE-CAPILLARY: 106 mg/dL — AB (ref 65–99)
Glucose-Capillary: 75 mg/dL (ref 65–99)
Glucose-Capillary: 80 mg/dL (ref 65–99)
Glucose-Capillary: 90 mg/dL (ref 65–99)

## 2015-07-30 MED ORDER — FLUCONAZOLE IN DEXTROSE 200 MG/100ML IV SOLN: 200.0000 mg | Freq: Every day | INTRAVENOUS | Status: AC

## 2015-07-30 MED ORDER — DEXTROSE 50 % IV SOLN
25.0000 mL | Freq: Once | INTRAVENOUS | Status: AC
  Administered 2015-07-30: 25 mL via INTRAVENOUS

## 2015-07-30 MED ORDER — FLUCONAZOLE IN SODIUM CHLORIDE 200-0.9 MG/100ML-% IV SOLN
200.0000 mg | Freq: Once | INTRAVENOUS | Status: AC
  Administered 2015-07-30: 200 mg via INTRAVENOUS
  Filled 2015-07-30: qty 100

## 2015-07-30 MED ORDER — FLUCONAZOLE 100 MG PO TABS
200.0000 mg | ORAL_TABLET | Freq: Every day | ORAL | Status: DC
  Filled 2015-07-30: qty 2

## 2015-07-30 MED ORDER — FLUCONAZOLE 40 MG/ML PO SUSR: 200.0000 mg | Freq: Every day | ORAL | Status: DC

## 2015-07-30 NOTE — Progress Notes (Addendum)
Hypoglycemic Event  CBG: CBG 65  Treatment: 25 mL of D50 intravenous  Symptoms: None  Follow-up CBG: Time: 13:30 CBG Result: 106  Possible Reasons for Event: Pt has not been intaking PO  Comments/MD notified:Dr. Almon RegisterHernandez   Darshana Curnutt N Gerren Hoffmeier

## 2015-07-30 NOTE — Progress Notes (Signed)
[name]  MRN: [id number]  DOB/AGE: December 05, 1933 82 y.o.  Primary Care Physician:ARIZA,FERNANDO Thyra Breed, MD  Admit date: 07/25/2015  Chief Complaint:  Chief Complaint  Patient presents with  . Fever  . Urine Output    decreased    S-Pt presented on  07/25/2015 with  Chief Complaint  Patient presents with  . Fever  . Urine Output    decreased  . Pt does not to offer any complaints.  Pt is laying comfortably in no apparent distress.   Meds  . fluconazole  200 mg Oral Daily  . heparin  5,000 Units Subcutaneous Q8H  . insulin aspart  0-9 Units Subcutaneous Q4H      Physical Exam: Vital signs in last 24 hours: Temp:  [98 F (36.7 C)-98.3 F (36.8 C)] 98.3 F (36.8 C) (05/17 0631) Pulse Rate:  [70-88] 70 (05/17 0631) Resp:  [16-20] 16 (05/17 0631) BP: (138-167)/(69-73) 138/73 mmHg (05/17 0631) SpO2:  [99 %-100 %] 100 % (05/17 0631) Weight:  [110 lb 10.7 oz (50.2 kg)] 110 lb 10.7 oz (50.2 kg) (05/17 0631) Weight change: -0.4 oz (-0.01 kg)    Intake/Output from previous day: 05/16 0701 - 05/17 0700 In: 2956.4 [I.V.:2956.4] Out: 3000 [Urine:3000]     Physical Exam: General- pt laying comfortably Resp- No acute REsp distress, decreased at bases. CVS- S1S2 regular in rate and rhythm, SEM + GIT- BS+, soft, NT, ND EXT- b/L BKA, trace Edema,No Cyanosis   Lab Results: HGb 9.9  BMET  Recent Labs  07/29/15 0424 07/30/15 0430  NA 136 134*  K 3.6 3.4*  CL 109 108  CO2 16* 18*  GLUCOSE 90 83  BUN 33* 25*  CREATININE 2.44* 1.82*  CALCIUM 7.4* 7.2*   Creat trend 2017  6.1=>4.69=>5.1=>4.1=>3.3=>2.44=>1.82    MICRO Recent Results (from the past 240 hour(s))  Urine culture     Status: Abnormal   Collection Time: 07/25/15  5:27 PM  Result Value Ref Range Status   Specimen Description URINE, CATHETERIZED  Final   Special Requests NONE  Final   Culture >=100,000 COLONIES/mL YEAST (A)  Final   Report Status 07/27/2015 FINAL  Final  Blood Culture  (routine x 2)     Status: Abnormal   Collection Time: 07/25/15  6:05 PM  Result Value Ref Range Status   Specimen Description BLOOD RIGHT ANTECUBITAL DRAWN BY RN  Final   Special Requests BOTTLES DRAWN AEROBIC ONLY 6CC  Final   Culture  Setup Time   Final    YEAST Gram Stain Report Called to,Read Back By and Verified With: SPANGLER,E. AT 1823 ON 07/26/2015 BY AGUNDIZ,E. AEROBIC BOTTLE ONLY Performed at [facility]    Culture CANDIDA TROPICALIS (A)  Final   Report Status 07/29/2015 FINAL  Final  Blood Culture (routine x 2)     Status: Abnormal   Collection Time: 07/25/15  6:05 PM  Result Value Ref Range Status   Specimen Description BLOOD RIGHT ANTECUBITAL DRAWN BY RN  Final   Special Requests BOTTLES DRAWN AEROBIC ONLY 6CC  Final   Culture  Setup Time   Final    YEAST Gram Stain Report Called to,Read Back By and Verified With: SPANGLER,E. AT 1721 ON 07/26/2015 BY AGUNDIZ,E. AEROBIC BOTTLE ONLY Performed at [facility]    Culture CANDIDA TROPICALIS (A)  Final   Report Status 07/29/2015 FINAL  Final  MRSA PCR Screening     Status: None   Collection Time: 07/25/15 10:20 PM  Result Value Ref  Range Status   MRSA by PCR NEGATIVE NEGATIVE Final    Comment:        The GeneXpert MRSA Assay (FDA approved for NASAL specimens only), is one component of a comprehensive MRSA colonization surveillance program. It is not intended to diagnose MRSA infection nor to guide or monitor treatment for MRSA infections.   Culture, blood (routine x 2)     Status: None (Preliminary result)   Collection Time: 07/27/15 12:31 PM  Result Value Ref Range Status   Specimen Description BLOOD PORTA CATH DRAWN BY RN  Final   Special Requests BOTTLES DRAWN AEROBIC AND ANAEROBIC 6CC EACH  Final   Culture NO GROWTH 2 DAYS  Final   Report Status PENDING  Incomplete  Culture, blood (routine x 2)     Status: None (Preliminary result)   Collection Time: 07/27/15 12:40 PM  Result Value Ref Range  Status   Specimen Description BLOOD LEFT HAND  Final   Special Requests BOTTLES DRAWN AEROBIC AND ANAEROBIC 6CC EACH  Final   Culture NO GROWTH 2 DAYS  Final   Report Status PENDING  Incomplete      Lab Results  Component Value Date   CALCIUM 7.2* 07/30/2015   PHOS 2.3* 07/30/2015   Alb 2.0 Corrected calcium 6.5+1.6=8.1     Impression: 1)Renal  AKI secondary to ATN               AKI sec to Gentamycin /ACE               Non oliguric ATN               AKI now improving               Creat trending down.              Will follow up ion bmet.    2)HTN  Medication- On Diuretics- On Calcium Channel Blockers On Beta blockers    3)Anemia HGb stable  4)Hypocalcemia Phosphorus at goal. After correcting for low albumin calcium is still low but stable  5)ID-UTI    On Dflucan   6)Electrolytes Hypokalemic   On IVF with Kcl  Hyponatremic   7)Acid base Co2 not at goal But stable    Plan:  Will continue current care Will follow bmet    Ina Poupard S 07/30/2015, 8:50 AM

## 2015-07-30 NOTE — Care Management Important Message (Signed)
Important Message  Patient Details  Name: Sara GuarneriHelen W [name] MRN: [id number] Date of Birth: 02-25-1934   Medicare Important Message Given:  Yes    Malcolm MetroChildress, Jandiel Magallanes Demske, RN 07/30/2015, 9:34 AM

## 2015-07-30 NOTE — NC FL2 (Signed)
[place] MEDICAID FL2 LEVEL OF CARE SCREENING TOOL     IDENTIFICATION  Patient Name: [name] Birthdate: 1933-09-29 Sex: female Admission Date (Current Location): 07/25/2015  [facility] and [place] Number:  [employer] and Address:  [facility],  [address] [place] [address]      Provider Number: [id number]  Attending Physician Name and Address:  Micael Hampshire Acost*  Relative Name and Phone Number:       Current Level of Care: Hospital Recommended Level of Care: Skilled Nursing Facility Prior Approval Number:    Date Approved/Denied:   PASRR Number: [id number]  Discharge Plan: SNF    Current Diagnoses: Patient Active Problem List   Diagnosis Date Noted  . DNR (do not resuscitate) discussion   . Acute renal failure (HCC)   . Malfunction of Foley catheter (HCC)   . Malnourished (HCC)   . Fungemia 07/27/2015  . Renal failure (ARF), acute on chronic (HCC) 07/25/2015  . Type 2 diabetes mellitus with renal manifestations (HCC) 07/25/2015  . Hypertension 07/25/2015  . Chronic anemia 07/25/2015  . HCAP (healthcare-associated pneumonia)   . Palliative care encounter   . Pressure ulcer 07/09/2015  . Metabolic acidosis 07/04/2015  . Lactic acidosis 07/04/2015  . Dementia   . Buttock wound   . Encephalopathy, metabolic Toxic 09/24/2013  . UTI (urinary tract infection) 09/21/2013  . DM (diabetes mellitus), type 2, uncontrolled, periph vascular complic (HCC) 04/22/2013  . Dehydration 04/21/2013  . Hyperosmolar non-ketotic state in patient with type 2 diabetes mellitus (HCC) 04/21/2013  . CAD (coronary artery disease) 04/21/2013  . Hyperglycemia 04/21/2013  . Dysphagia 04/21/2013  . h/o Stroke   . GERD (gastroesophageal reflux disease) 03/18/2011  . GASTROPARESIS 10/27/2009  . HYPERLIPIDEMIA 11/05/2008  . DEPRESSION 11/05/2008  . Essential hypertension 11/05/2008  . PERIPHERAL VASCULAR DISEASE 11/05/2008  . COPD 11/05/2008  . GI  BLEEDING 11/05/2008  . NAUSEA AND VOMITING 11/05/2008    Orientation RESPIRATION BLADDER Height & Weight     Disoriented x4  Normal Indwelling catheter Weight: 110 lb 10.7 oz (50.2 kg) Height:  5' (152.4 cm)  BEHAVIORAL SYMPTOMS/MOOD NEUROLOGICAL BOWEL NUTRITION STATUS   n/a   Incontinent Diet (Dysphagia 1 with honey thick liquids. 100% feeder assist. )  AMBULATORY STATUS COMMUNICATION OF NEEDS Skin   Total Care Verbally (incoherent) Bruising, Other (Comment) (Stage III to right buttocks with foam dressing)                       Personal Care Assistance Level of Assistance  Total care Bathing Assistance: Maximum assistance Feeding assistance: Maximum assistance Dressing Assistance: Maximum assistance     Functional Limitations Info    Sight Info: Impaired Hearing Info: Impaired Speech Info: Impaired    SPECIAL CARE FACTORS FREQUENCY                       Contractures      Additional Factors Info  Code Status Code Status Info: DNR Allergies Info: Penicilians           Current Medications (07/30/2015):  This is the current hospital active medication list Current Facility-Administered Medications  Medication Dose Route Frequency Provider Last Rate Last Dose  . acetaminophen (TYLENOL) tablet 650 mg  650 mg Oral Q6H PRN Eduard Clos, MD       Or  . acetaminophen (TYLENOL) suppository 650 mg  650 mg Rectal Q6H PRN Eduard Clos, MD      . [  START ON 08/10/2015] fluconazole (DIFLUCAN) 40 MG/ML suspension 200 mg  200 mg Oral Daily Henderson CloudEstela Y Hernandez Acosta, MD      . heparin injection 5,000 Units  5,000 Units Subcutaneous Q8H Eduard ClosArshad N Kakrakandy, MD   5,000 Units at 07/30/15 1336  . insulin aspart (novoLOG) injection 0-9 Units  0-9 Units Subcutaneous Q4H Eduard ClosArshad N Kakrakandy, MD   1 Units at 07/28/15 1204  . ondansetron (ZOFRAN) tablet 4 mg  4 mg Oral Q6H PRN Eduard ClosArshad N Kakrakandy, MD       Or  . ondansetron [facility](ZOFRAN) injection 4 mg  4 mg Intravenous Q6H  PRN Eduard ClosArshad N Kakrakandy, MD      . sodium chloride 0.45 % 1,000 mL with potassium chloride 40 mEq infusion   Intravenous Continuous Salomon MastBelayenh Befekadu, MD 125 mL/hr at 07/30/15 [id number]       Discharge Medications: Please see discharge summary for a list of discharge medications.  Relevant Imaging Results:  Relevant Lab Results:   Additional Information SSN [id number]. PICC line.   Derenda FennelStultz, October Peery [place], [place] [phone]

## 2015-07-30 NOTE — Discharge Summary (Signed)
Physician Discharge Summary  KAVINA CANTAVE OZH:[id number] DOB: Sep 01, 1933 DOA: 07/25/2015  PCP: Bernerd Limbo, MD  Admit date: 07/25/2015 Discharge date: 07/30/2015  Time spent: 45 minutes  Recommendations for Outpatient Follow-up:  -Will be discharged back to SNF today with hospice sevices. -To complete treatment with diflucan which finishes on 08/10/15. -Please remove PICC line after treatment with fluconazole is complete.   Discharge Diagnoses:  Principal Problem:   Fungemia Active Problems:   PERIPHERAL VASCULAR DISEASE   DM (diabetes mellitus), type 2, uncontrolled, periph vascular complic (HCC)   UTI (urinary tract infection)   Pressure ulcer   Renal failure (ARF), acute on chronic (HCC)   Type 2 diabetes mellitus with renal manifestations (HCC)   Hypertension   Chronic anemia   Acute renal failure (HCC)   Malfunction of Foley catheter (HCC)   Malnourished (HCC)   DNR (do not resuscitate) discussion   Discharge Condition: Stable and improved  Filed Weights   07/28/15 0500 07/29/15 0637 07/30/15 0631  Weight: 50.1 kg (110 lb 7.2 oz) 50.21 kg (110 lb 11.1 oz) 50.2 kg (110 lb 10.7 oz)    History of present illness:  As per Dr. Toniann Fail on 5/12: [name] is a 82 y.o. female with medical history significant of dementia, hypertension diabetes mellitus and chronic kidney disease and anemia who was recently admitted and discharged 4 days ago for acute renal failure and at that time renal failure was attributed to prerenal causes and also patient was on Lasix ACE inhibitor and had received gentamicin and at time of discharge patient's creatinine was 2.9 was found to have decreased urine output today with fever. Patient is demented and does not contribute to the history. As per patient's daughter patient did not have any nausea vomiting diarrhea chest pain or cough. Has been eating poorly for the last many days. In the ER after patient's Foley was removed patient  had a large amount of urine output. Patient also was mildly febrile with leukocytosis chest x-ray showing infiltrates and UA showing features consistent with UTI. Patient has been admitted for further management.  ED Course: PICC line was placed and IV fluids started with empiric antibiotics.  Hospital Course:  1. 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 Excellent urine output, improving daily.  2. Candida tropicalis UTI and fungemia. Remains afebrile with stable vital signs. Per infectious disease, minimum 14 day therapy from first negative blood culture. If daughter desires, it would be ideal to obtain outpatient ophthalmology exam to exclude fungal endophthalmitis. However, her clinical exam suggests that she will not be able to tolerate transport to an office and undergoing an exam. To continue Diflucan until 08/10/2015. 3. Sepsis secondary to fungemia, resolved. Repeat blood cultures pending, no growth today. 4. Bilateral pneumonia, favor aspiration. Appears resolved. Stop clindamycin today. 5. Severe dysphagia. D1/puree with HTL if feeding desired but felt to be very high-risk for aspiration. Appreciate GI involvement, daughter has decided against PEG tube placement, concur. 6. Acute on chronic anemia, likely from CKD. Improved status post transfusion. No evidence of bleeding. Likely anemia of critical illness. Continue to monitor.  7. Diabetes mellitus type 2 with peripheral vascular disease. Remains stable.  8. Stage 3 decubitus ulcer on the scaral area and buttock. Present on admission. Wound care consultation appreciated. 9. Dementia   Procedures:  None   Consultations:  ID  Discharge Instructions  Discharge Instructions    Continue PICC at discharge  Complete by:  As directed   Flush PICC line per home health policy:  Yes     Increase activity slowly    Complete by:  As directed              Medication List    STOP taking these medications        Cranberry 450 MG Caps     lovastatin 40 MG tablet  Commonly known as:  MEVACOR     omeprazole 20 MG capsule  Commonly known as:  PRILOSEC      TAKE these medications        amLODipine 5 MG tablet  Commonly known as:  NORVASC  Take 1 tablet (5 mg total) by mouth daily.     aspirin EC 81 MG tablet  Take 81 mg by mouth daily.     cholecalciferol 1000 units tablet  Commonly known as:  VITAMIN D  Take 2,000 Units by mouth daily.     feeding supplement (PRO-STAT SUGAR FREE 64) Liqd  Take 30 mLs by mouth 2 (two) times daily.     fluconazole 200 MG/100ML IVPB  Commonly known as:  DIFLUCAN  Inject 100 mLs (200 mg total) into the vein daily.     HYDROcodone-acetaminophen 5-325 MG tablet  Commonly known as:  NORCO/VICODIN  Take 1 tablet by mouth every 12 (twelve) hours.     insulin aspart 100 UNIT/ML injection  Commonly known as:  novoLOG  Inject 2-5 Units into the skin 4 (four) times daily - after meals and at bedtime. Per sliding. 200-250= 2 units >70 call MD; 251-300 = 3 units; 301-350 = 4 units; 351-400 = 5 units; <400 call MD.     metoprolol tartrate 25 MG tablet  Commonly known as:  LOPRESSOR  Take 1 tablet (25 mg total) by mouth 2 (two) times daily.     SENNA PLUS 8.6-50 MG tablet  Generic drug:  senna-docusate  Take 1 tablet by mouth 2 (two) times daily.       Allergies  Allergen Reactions  . Penicillins     REACTION: unknown reaction: MAR reported      The results of significant diagnostics from this hospitalization (including imaging, microbiology, ancillary and laboratory) are listed below for reference.    Significant Diagnostic Studies: US Renal  07/06/2015  CLINICAL DATA:  Acute kidney injury, UTI EXAM: RENAL / URINARY TRACT ULTRASOUND COMPLETE COMPARISON:  CT abdomen pelvis dated 12/18/2009 FINDINGS: Right Kidney: Length: 11.1 cm. Echogenic renal parenchyma, suggesting medical renal  disease. No hydronephrosis. Left Kidney: Length: 10.9 cm. Echogenic renal parenchyma, suggesting medical renal disease. No hydronephrosis. Bladder: Decompressed by indwelling Foley catheter. IMPRESSION: Echogenic renal parenchyma, suggesting medical renal disease. No hydronephrosis. Bladder decompressed by indwelling Foley catheter. Electronically Signed   By: Charline Bills M.D.   On: 07/06/2015 11:30   Dg Chest Port 1 View  07/25/2015  CLINICAL DATA:  Reason for exam: fever Pt not responding to questions. Hx HTN, DM EXAM: PORTABLE CHEST 1 VIEW COMPARISON:  07/04/2015 FINDINGS: The heart is enlarged. There are bibasilar opacities, left greater than right. The left hemidiaphragm is obscured. Suspect left pleural effusion. No pulmonary edema. IMPRESSION: 1. Cardiomegaly without pulmonary edema. 2. Bilateral lower lobe infiltrates, left greater than right. Electronically Signed   By: Norva Pavlov M.D.   On: 07/25/2015 17:38   Dg Chest Port 1 View  07/04/2015  CLINICAL DATA:  PICC line placement.  Acute renal failure. EXAM: PORTABLE CHEST 1 VIEW COMPARISON:  04/21/2013 FINDINGS: Right PICC line tip:  SVC.  No pneumothorax. Borderline enlargement of the cardiopericardial silhouette with atherosclerotic aortic arch. The lungs appear clear. IMPRESSION: 1. Right PICC line tip:  SVC.  No complicating feature. 2. Borderline enlargement of the cardiopericardial silhouette. 3. Atherosclerotic aortic arch. Electronically Signed   By: Gaylyn Rong M.D.   On: 07/04/2015 18:52   Dg Swallowing Func-speech Pathology  07/28/2015  Dorene Ar, CCC-SLP     07/28/2015  1:12 PM Objective Swallowing Evaluation: Type of Study: MBS-Modified Barium Swallow Study Patient Details Name: LENE MCKAY MRN: [id number] Date of Birth: 1934/03/15 Today's Date: 07/28/2015 Time: SLP Start Time (ACUTE ONLY): 1214-SLP Stop Time (ACUTE ONLY): 1242 SLP Time Calculation (min) (ACUTE ONLY): 28 min Past Medical History: Past Medical  History Diagnosis Date . HTN (hypertension)  . Stroke (HCC)  . Diabetes mellitus without complication (HCC)  . Embolism (HCC)  . PVD (peripheral vascular disease) (HCC)  . Dementia  . Chronic kidney disease  . Neuromuscular dysfunction of bladder  . Extended spectrum beta lactamase (ESBL) resistance  . Hyperlipidemia  . Gastroesophageal reflux disease  . Dementia  . Plasma protein disorder  . Osteoporosis  . Pressure ulcer of left buttock  . Vitamin D deficiency  . Constipation  . Hypokalemia  . Retention of urine  . Peripheral vascular disease (HCC)  . Dysphagia  Past Surgical History: Past Surgical History Procedure Laterality Date . Below knee leg amputation     left . 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 Lump removed     from left breast . S/p hysterectomy   . Cholecystectomy   . Esophagogastroduodenoscopy  09/23/08   SLF:no barrett's ring ,mass or stricture HPI: Ellary Casamento is a 82 year old woman with dementia recently hospitalized for 17 days with discharge 5/8 for acute renal failure, gram-positive bacteremia, lactic acidosis who presented 5/12 with report of recurring fever. In the emergency department Foley catheter was replaced with a large amount of urine output. Admitted for acute kidney injury, suspected aspiration pneumonia with possible sepsis, UTI, acute on chronic anemia.Chest x-ray shows: The heart is enlarged. There are bibasilar opacities, left greater than right. The left hemidiaphragm is obscured. Suspect left pleural effusion. No pulmonary edema.Cardiomegaly without pulmonary edema.Bilateral lower lobe infiltrates, left greater than right. SLP consulted for swallowing evaluation. Pt was seen by this SLP 2 years ago. Pt admitted from [facility] and treating SLP reports that pt was on puree diet with thin liquids. Subjective: Pt nonverbal, humming in bed Assessment / Plan / Recommendation CHL IP CLINICAL IMPRESSIONS 07/28/2015 Therapy Diagnosis Moderate oral phase  dysphagia;Severe oral phase dysphagia;Moderate pharyngeal phase dysphagia Clinical Impression Pt presents with moderate oropharyngeal phase dysphagia with suspected sensory and visible motor deficits characterized by poor labial closure, decreased lingual movement, decreased bolus cohesiveness with decreased anterior posterior transit across consistencies, premature spillage with delay in swallow initiation with swallow trigger after spilling to pyriforms with thins and nectars, deceased tongue base retraction and epiglottic deflection resulting in overall decreased pharyngeal pressure with decreased airway protection resulting in penetration and aspiration of thins during and after the swallow which was essentially silent in nature (no protective cough greater than 75% of the time). This also occurred with nectar-thick liquids, however to a lesser degree. Pt safest with puree texture and only resulted in vallecular residue (which was never completely cleared so poses risk for aspiration as well) and honey-thick liquids via teaspoon presentations. No penetration or aspiration  observed with hone-thick liquids during or immediately after the swallow (which did occur with thin and NTL), but did result in vallecular residue (same of all texture/consistencies). Pt overall very weak and unable to follow directions 75% of the time so is a poor candidate for compensatory strategies besides what can be controlled by feeder. Recommend D1/puree with HTL (honey-thick liquids) presented via teaspoon presentations, however it should be understood that pt is at risk for aspiration, dehydration, and malnutrition due to the above. Risks for aspiration PNA can be reduced but not eliminated by ensuring good oral care, feeding pt in upright position when alert, and encouraging pt to swallow (benefits from visual prompt from feeder) several times. Recommend palliative care consult due to chronic illness trajectory to help establish  goals of care. Should pt/family choose comfort care if/when appropriate, consider offering thin liquids/ice chips after oral care for comfort if desired. I suspect feeding pt will be difficult as she required max cues for po intake this date. SLP will follow per goals of care. Please send Magic Cups for pt to try. Impact on safety and function Severe aspiration risk;Risk for inadequate nutrition/hydration   CHL IP TREATMENT RECOMMENDATION 07/28/2015 Treatment Recommendations Therapy as outlined in treatment plan below   Prognosis 07/28/2015 Prognosis for Safe Diet Advancement Guarded Barriers to Reach Goals Cognitive deficits;Severity of deficits Barriers/Prognosis Comment noted decline in recent months in function CHL IP DIET RECOMMENDATION 07/28/2015 SLP Diet Recommendations Dysphagia 1 (Puree) solids;Honey thick liquids Liquid Administration via Spoon Medication Administration Crushed with puree Compensations Slow rate;Small sips/bites;Lingual sweep for clearance of pocketing Postural Changes Remain semi-upright after after feeds/meals (Comment);Seated upright at 90 degrees   CHL IP OTHER RECOMMENDATIONS 07/28/2015 Recommended Consults --Consider palliative care consult to assist with goals of care given severity of dysphagia with aspiration risks in setting of dementia Oral Care Recommendations Oral care QID;Oral care before and after PO;Staff/trained caregiver to provide oral care Other Recommendations Order thickener from pharmacy;Prohibited food (jello, ice cream, thin soups);Clarify dietary restrictions   CHL IP FOLLOW UP RECOMMENDATIONS 07/28/2015 Follow up Recommendations 24 hour supervision/assistance   CHL IP FREQUENCY AND DURATION 07/28/2015 Speech Therapy Frequency (ACUTE ONLY) min 2x/week Treatment Duration 1 week      CHL IP ORAL PHASE 07/28/2015 Oral Phase Impaired Oral - Pudding Teaspoon -- Oral - Pudding Cup -- Oral - Honey Teaspoon Weak lingual manipulation;Incomplete tongue to palate contact;Reduced  posterior propulsion;Holding of bolus;Lingual/palatal residue;Delayed oral transit Oral - Honey Cup -- Oral - Nectar Teaspoon -- Oral - Nectar Cup Weak lingual manipulation;Incomplete tongue to palate contact;Reduced posterior propulsion;Holding of bolus;Lingual/palatal residue;Piecemeal swallowing;Delayed oral transit;Premature spillage Oral - Nectar Straw Delayed oral transit Oral - Thin Teaspoon Weak lingual manipulation;Incomplete tongue to palate contact;Reduced posterior propulsion;Holding of bolus;Lingual/palatal residue;Piecemeal swallowing;Delayed oral transit;Premature spillage Oral - Thin Cup Weak lingual manipulation;Incomplete tongue to palate contact;Reduced posterior propulsion;Holding of bolus;Lingual/palatal residue;Piecemeal swallowing;Delayed oral transit;Premature spillage Oral - Thin Straw -- Oral - Puree Weak lingual manipulation;Incomplete tongue to palate contact;Reduced posterior propulsion;Holding of bolus;Lingual/palatal residue;Piecemeal swallowing;Delayed oral transit;Decreased bolus cohesion Oral - Mech Soft -- Oral - Regular -- Oral - Multi-Consistency -- Oral - Pill -- Oral Phase - Comment (No Data)  CHL IP PHARYNGEAL PHASE 07/28/2015 Pharyngeal Phase Impaired Pharyngeal- Pudding Teaspoon -- Pharyngeal -- Pharyngeal- Pudding Cup -- Pharyngeal -- Pharyngeal- Honey Teaspoon Delayed swallow initiation-vallecula;Reduced pharyngeal peristalsis;Reduced epiglottic inversion;Reduced anterior laryngeal mobility;Reduced laryngeal elevation;Reduced tongue base retraction;Penetration/Apiration after swallow;Pharyngeal residue - valleculae;Lateral channel residue Pharyngeal Material does not enter airway;Material enters airway, remains ABOVE  vocal cords then ejected out Pharyngeal- Honey Cup -- Pharyngeal -- Pharyngeal- Nectar Teaspoon Delayed swallow initiation-pyriform sinuses;Reduced pharyngeal peristalsis;Reduced epiglottic inversion;Reduced anterior laryngeal mobility;Reduced laryngeal  elevation;Reduced airway/laryngeal closure;Reduced tongue base retraction;Penetration/Aspiration during swallow;Penetration/Apiration after swallow;Trace aspiration;Moderate aspiration;Pharyngeal residue - valleculae;Lateral channel residue Pharyngeal Material enters airway, passes BELOW cords without attempt by patient to eject out (silent aspiration);Material enters airway, remains ABOVE vocal cords and not ejected out Pharyngeal- Nectar Cup Delayed swallow initiation-pyriform sinuses;Reduced pharyngeal peristalsis;Reduced epiglottic inversion;Reduced anterior laryngeal mobility;Reduced laryngeal elevation;Reduced airway/laryngeal closure;Reduced tongue base retraction;Penetration/Aspiration during swallow;Penetration/Apiration after swallow;Trace aspiration;Moderate aspiration;Pharyngeal residue - valleculae;Pharyngeal residue - pyriform;Lateral channel residue Pharyngeal Material enters airway, passes BELOW cords without attempt by patient to eject out (silent aspiration);Material enters airway, remains ABOVE vocal cords and not ejected out Pharyngeal- Nectar Straw Delayed swallow initiation-pyriform sinuses;Reduced pharyngeal peristalsis;Reduced epiglottic inversion;Reduced anterior laryngeal mobility;Reduced laryngeal elevation;Reduced airway/laryngeal closure;Reduced tongue base retraction;Penetration/Aspiration during swallow;Penetration/Apiration after swallow;Moderate aspiration;Significant aspiration (Amount);Pharyngeal residue - valleculae;Pharyngeal residue - pyriform Pharyngeal Material enters airway, passes BELOW cords without attempt by patient to eject out (silent aspiration);Material enters airway, passes BELOW cords then ejected out;Material enters airway, remains ABOVE vocal cords and not ejected out Pharyngeal- Thin Teaspoon Delayed swallow initiation-pyriform sinuses;Reduced pharyngeal peristalsis;Reduced epiglottic inversion;Reduced anterior laryngeal mobility;Reduced laryngeal  elevation;Reduced airway/laryngeal closure;Reduced tongue base retraction;Penetration/Aspiration during swallow;Penetration/Apiration after swallow;Trace aspiration;Pharyngeal residue - valleculae;Pharyngeal residue - pyriform;Lateral channel residue Pharyngeal Material enters airway, passes BELOW cords without attempt by patient to eject out (silent aspiration);Material enters airway, remains ABOVE vocal cords then ejected out Pharyngeal- Thin Cup Delayed swallow initiation-pyriform sinuses;Reduced pharyngeal peristalsis;Reduced epiglottic inversion;Reduced anterior laryngeal mobility;Reduced laryngeal elevation;Reduced airway/laryngeal closure;Reduced tongue base retraction;Penetration/Aspiration during swallow;Penetration/Apiration after swallow;Moderate aspiration;Pharyngeal residue - valleculae;Pharyngeal residue - pyriform;Lateral channel residue Pharyngeal Material enters airway, passes BELOW cords without attempt by patient to eject out (silent aspiration) Pharyngeal- Thin Straw -- Pharyngeal -- Pharyngeal- Puree Delayed swallow initiation-vallecula;Reduced tongue base retraction;Pharyngeal residue - valleculae Pharyngeal -- Pharyngeal- Mechanical Soft -- Pharyngeal -- Pharyngeal- Regular -- Pharyngeal -- Pharyngeal- Multi-consistency -- Pharyngeal -- Pharyngeal- Pill -- Pharyngeal -- Pharyngeal Comment --  CHL IP CERVICAL ESOPHAGEAL PHASE 07/28/2015 Cervical Esophageal Phase WFL Pudding Teaspoon -- Pudding Cup -- Honey Teaspoon -- Honey Cup -- Nectar Teaspoon -- Nectar Cup -- Nectar Straw -- Thin Teaspoon -- Thin Cup -- Thin Straw -- Puree -- Mechanical Soft -- Regular -- Multi-consistency -- Pill -- Cervical Esophageal Comment -- No flowsheet data found. Thank you, Havery MorosDabney Porter, CCC-SLP [phone] PORTER,DABNEY 07/28/2015, 12:54 PM               Microbiology: Recent Results (from the past 240 hour(s))  Urine culture     Status: Abnormal   Collection Time: 07/25/15  5:27 PM  Result Value Ref Range  Status   Specimen Description URINE, CATHETERIZED  Final   Special Requests NONE  Final   Culture >=100,000 COLONIES/mL YEAST (A)  Final   Report Status 07/27/2015 FINAL  Final  Blood Culture (routine x 2)     Status: Abnormal   Collection Time: 07/25/15  6:05 PM  Result Value Ref Range Status   Specimen Description BLOOD RIGHT ANTECUBITAL DRAWN BY RN  Final   Special Requests BOTTLES DRAWN AEROBIC ONLY 6CC  Final   Culture  Setup Time   Final    YEAST Gram Stain Report Called to,Read Back By and Verified With: SPANGLER,E. AT 1823 ON 07/26/2015 BY AGUNDIZ,E. AEROBIC BOTTLE ONLY [facility]    Culture CANDIDA TROPICALIS (A)  Final   Report Status  07/29/2015 FINAL  Final  Blood Culture (routine x 2)     Status: Abnormal   Collection Time: 07/25/15  6:05 PM  Result Value Ref Range Status   Specimen Description BLOOD RIGHT ANTECUBITAL DRAWN BY RN  Final   Special Requests BOTTLES DRAWN AEROBIC ONLY 6CC  Final   Culture  Setup Time   Final    YEAST Gram Stain Report Called to,Read Back By and Verified With: SPANGLER,E. AT 1721 ON 07/26/2015 BY AGUNDIZ,E. AEROBIC BOTTLE ONLY Performed at [facility]    Culture CANDIDA TROPICALIS (A)  Final   Report Status 07/29/2015 FINAL  Final  MRSA PCR Screening     Status: None   Collection Time: 07/25/15 10:20 PM  Result Value Ref Range Status   MRSA by PCR NEGATIVE NEGATIVE Final    Comment:        The GeneXpert MRSA Assay (FDA approved for NASAL specimens only), is one component of a comprehensive MRSA colonization surveillance program. It is not intended to diagnose MRSA infection nor to guide or monitor treatment for MRSA infections.   Culture, blood (routine x 2)     Status: None (Preliminary result)   Collection Time: 07/27/15 12:31 PM  Result Value Ref Range Status   Specimen Description BLOOD PORTA CATH DRAWN BY RN  Final   Special Requests BOTTLES DRAWN AEROBIC AND ANAEROBIC 6CC EACH  Final    Culture NO GROWTH 3 DAYS  Final   Report Status PENDING  Incomplete  Culture, blood (routine x 2)     Status: None (Preliminary result)   Collection Time: 07/27/15 12:40 PM  Result Value Ref Range Status   Specimen Description BLOOD LEFT HAND  Final   Special Requests BOTTLES DRAWN AEROBIC AND ANAEROBIC [facility] EACH  Final   Culture NO GROWTH 3 DAYS  Final   Report Status PENDING  Incomplete     Labs: Basic Metabolic Panel:  Recent Labs Lab 07/27/15 0454 07/27/15 0455 07/27/15 1147 07/28/15 0452 07/29/15 0424 07/30/15 0430  NA  --  135 134* 135 136 134*  K  --  2.7* 3.2* 3.2* 3.6 3.4*  CL  --  109 109 108 109 108  CO2  --  17* 16* 16* 16* 18*  GLUCOSE  --  98 94 72 90 83  BUN  --  60* 57* 47* 33* 25*  CREATININE  --  4.90* 4.35* 3.45* 2.44* 1.82*  CALCIUM  --  6.7* 6.6* 7.2* 7.4* 7.2*  MG 1.2*  --   --   --   --   --   PHOS  --  5.1*  --  4.5 3.1 2.3*   Liver Function Tests:  Recent Labs Lab 07/25/15 1805 07/26/15 0516 07/27/15 0455 07/28/15 0452 07/29/15 0424 07/30/15 0430  AST 17 23  --   --   --   --   ALT 9* 9*  --   --   --   --   ALKPHOS 101 85  --   --   --   --   BILITOT 0.5 0.5  --   --   --   --   PROT 5.6* 4.6*  --   --   --   --   ALBUMIN 1.9* 1.6* 1.6* 1.8* 1.8* 1.9*   No results for input(s): LIPASE, AMYLASE in the last 168 hours. No results for input(s): AMMONIA in the last 168 hours. CBC:  Recent Labs Lab 07/25/15 1805 07/26/15 0516 07/27/15 0455  WBC 20.3* 12.4* 8.3  NEUTROABS 19.6* 12.0*  --   HGB 7.9* 6.7* 9.9*  HCT 22.2* 19.6* 27.4*  MCV 83.8 84.8 82.8  PLT 196 174 171   Cardiac Enzymes: No results for input(s): CKTOTAL, CKMB, CKMBINDEX, TROPONINI in the last 168 hours. BNP: BNP (last 3 results) No results for input(s): BNP in the last 8760 hours.  ProBNP (last 3 results) No results for input(s): PROBNP in the last 8760 hours.  CBG:  Recent Labs Lab 07/30/15 0022 07/30/15 0446 07/30/15 0715 07/30/15 1121 07/30/15 1331   GLUCAP 75 80 80 65 106*       Signed:  HERNANDEZ ACOSTA,Oluwasemilore Pascuzzi  [facility] Pager: [phone] 07/30/2015, 4:03 PM

## 2015-07-30 NOTE — Progress Notes (Signed)
Daily Progress Note   Patient Name: [name]       Date: 07/30/2015 DOB: 1933-12-12  Age: 82 y.o. MRN#: [id number] Attending Physician: Memory Argue* Primary Care Physician: Bernerd Limbo, MD Admit Date: 07/25/2015  Reason for Consultation/Follow-up: Disposition, Establishing goals of care, Psychosocial/spiritual support and Terminal Care  Subjective: [name] is lying quietly in bed, she does not open her eyes when I talk to her. She does not open her eyes or open her mouth when I hold pudding under her nose.  No family at bedside at this time.   Call to daughter Zonya Gudger. We talk about disposition, returning to [facility] skilled nursing facility today.   I share with her my efforts to communicate with Mrs. Jaffe earlier today, and my efforts to feed her.  We talk about the MOST form, but this is difficult as we are not face-to-face.  I encourage [name] to work with Child psychotherapist and primary care provider on site at [place], to complete the MOST form. I share that some families choose to do not re-hospitalize, do not treat the next infection, but that we would take [name] back to the hospital anytime they feel that that is appropriate.  I share our concerns about this difficult time for [name] and Mrs. Larita Fife. I share that we are continuing to support them.  I discussed the benefits of hospice, and [name] states, "they don't have that there".  I share that of [facility] is [name] home and that hospice is free, at no cost, in her home.  I share that hospice is an extra layer support, extra eyes to monitor [name] care.  [name], almost immediately, states,  "I would like that".  She states she would like [facility] to manage care.  Length of Stay:  5  Current Medications: Scheduled Meds:  . [START ON 08/10/2015] fluconazole  200 mg Oral Daily  . heparin  5,000 Units Subcutaneous Q8H  . insulin aspart  0-9 Units Subcutaneous Q4H    Continuous Infusions: . sodium chloride 0.45 % with kcl 125 mL/hr at 07/30/15 0652    PRN Meds: acetaminophen **OR** acetaminophen, ondansetron **OR** ondansetron (ZOFRAN) IV  Physical Exam  Constitutional: No distress.  Pulmonary/Chest: Effort normal. No respiratory distress.  Abdominal: Soft.  Neurological:  Lethargic, does not open eyes.   Skin: Skin is warm and dry.  Nursing note and vitals reviewed.           Vital Signs: BP 134/76 mmHg  Pulse 74  Temp(Src) 98.4 F (36.9 C) (Oral)  Resp 18  Ht 5' (1.524 m)  Wt 50.2 kg (110 lb 10.7 oz)  BMI 21.61 kg/m2  SpO2 98% SpO2: SpO2: 98 % O2 Device: O2 Device: Not Delivered O2 Flow Rate:    Intake/output summary:  Intake/Output Summary (Last 24 hours) at 07/30/15 1543 Last data filed at 07/30/15 [id number]  Gross per 24 hour  Intake 2956.42 ml  Output   3000 ml  Net -43.58 ml   LBM:   Baseline Weight: Weight: 54.432 kg (120 lb) Most recent weight: Weight: 50.2 kg (110 lb 10.7 oz)       Palliative Assessment/Data:      Patient Active Problem List   Diagnosis Date Noted  . DNR (do not resuscitate) discussion   . Acute renal failure (HCC)   . Malfunction of Foley catheter (HCC)   . Malnourished (HCC)   . Fungemia 07/27/2015  . Renal failure (ARF), acute on chronic (HCC) 07/25/2015  . Type 2 diabetes mellitus with renal manifestations (HCC) 07/25/2015  . Hypertension 07/25/2015  . Chronic anemia 07/25/2015  . HCAP (healthcare-associated pneumonia)   . Palliative care encounter   . Pressure ulcer 07/09/2015  . Metabolic acidosis 07/04/2015  . Lactic acidosis 07/04/2015  . Dementia   . Buttock wound   . Encephalopathy, metabolic Toxic 09/24/2013  . UTI (urinary tract infection) 09/21/2013  . DM (diabetes mellitus), type 2,  uncontrolled, periph vascular complic (HCC) 04/22/2013  . Dehydration 04/21/2013  . Hyperosmolar non-ketotic state in patient with type 2 diabetes mellitus (HCC) 04/21/2013  . CAD (coronary artery disease) 04/21/2013  . Hyperglycemia 04/21/2013  . Dysphagia 04/21/2013  . h/o Stroke   . GERD (gastroesophageal reflux disease) 03/18/2011  . GASTROPARESIS 10/27/2009  . HYPERLIPIDEMIA 11/05/2008  . DEPRESSION 11/05/2008  . Essential hypertension 11/05/2008  . PERIPHERAL VASCULAR DISEASE 11/05/2008  . COPD 11/05/2008  . GI BLEEDING 11/05/2008  . NAUSEA AND VOMITING 11/05/2008    Palliative Care Assessment & Plan   Patient Profile: [name] is a 82 year old female patient with a history of dementia, decubitus ulcer, CVA, peripheral vascular disease status post bilateral lower extremity amputation, with the chronic indwelling Foley catheter. She's been hospitalised several times in the last few months.  Recent hospitalization shows fungal infection in the bloodstream. She will be treated with oral Diflucan for 2 weeks.  Assessment: as above  Recommendations/Plan:  return to [facility] skilled nursing facility with the benefit of hospice  Goals of Care and Additional Recommendations:  Limitations on Scope of Treatment: Treat the treatable, no extraordinary measures, no CPR/intubation, no peg tube.  Code Status:    Code Status Orders        Start     Ordered   07/25/15 2113  Do not attempt resuscitation (DNR)   Continuous    Question Answer Comment  In the event of cardiac or respiratory ARREST Do not call a "code blue"   In the event of cardiac or respiratory ARREST Do not perform Intubation, CPR, defibrillation or ACLS   In the event of cardiac or respiratory ARREST Use medication by any route, position, wound care, and other measures to relive pain and suffering. May use oxygen, suction and manual treatment of airway obstruction as needed for comfort.  07/25/15 2113      Code Status History    Date Active Date Inactive Code Status Order ID Comments User Context   07/04/2015 10:46 PM 07/21/2015  6:19 PM Full Code [id number]  Haydee Monica, MD Inpatient   09/21/2013  4:58 PM 09/24/2013  2:30 PM Full Code [id number]  Wilson Singer, MD ED   04/21/2013 11:12 PM 04/24/2013  9:02 PM Full Code [id number]  Haydee Monica, MD Inpatient       Prognosis:   < 4 weeks, likely based on recent declines, decreased functional status, and decreased PO intake.  Discharge Planning:  Skilled Nursing Facility with [facility] plan was discussed with nursing staff, CM, SW, and Dr. Viann Fish.   Thank you for allowing the Palliative Medicine Team to assist in the care of this patient.   Time In: 1510 Time Out: 1550 Total Time 40 minutes Prolonged Time Billed  no       Greater than 50%  of this time was spent counseling and coordinating care related to the above assessment and plan.  Zaccai Chavarin A, NP  Please contact Palliative Medicine Team phone at [phone] for questions and concerns.

## 2015-07-30 NOTE — Progress Notes (Signed)
Called report to [facility] nurse.  

## 2015-07-30 NOTE — Clinical Social Work Note (Signed)
Pt d/c today back to [facility]. Pt's daughter, [name] and facility aware and agreeable. Rinaldo CloudPamela [facility]. [facility] aware and will arrange tomorrow. Pt to transport via [facility].  Derenda FennelKara Emory Gallentine, LCSW [phone]

## 2015-07-30 NOTE — Care Management Note (Signed)
Case Management Note  Patient Details  Name: [name] MRN: [id number] Date of Birth: 1933/06/29    Expected Discharge Date:  07/28/15               Expected Discharge Plan:  Skilled Nursing Facility  In-House Referral:  Clinical Social Work, Hospice / Palliative Care  Discharge planning Services  CM Consult  Post Acute Care Choice:    Choice offered to:     DME Arranged:    DME Agency:     HH Arranged:    HH Agency:     Status of Service:  Completed, signed off  Medicare Important Message Given:  Yes Date Medicare IM Given:    Medicare IM give by:    Date Additional Medicare IM Given:    Additional Medicare Important Message give by:     If discussed at Long Length of Stay Meetings, dates discussed:    Additional Comments: Pt discharging today back to SNF with hospice services. SNF to complete hospice referral. CSW has made arrangements for return to facility.   Malcolm Metrohildress, Merwyn Hodapp Demske, RN 07/30/2015, 4:00 PM

## 2015-08-01 LAB — CULTURE, BLOOD (ROUTINE X 2)
CULTURE: NO GROWTH
Culture: NO GROWTH

## 2015-08-14 DEATH — deceased

## 2015-11-04 IMAGING — CT CT HEAD W/O CM
4 of 5 series · 11 of 47 positions shown, 12 images · non-contrast
Comparison: CT head 04/21/2013

CLINICAL DATA: Flipped a wheelchair in no parking lot at [place],
found lying on RIGHT side, past history of hypertension, diabetes
mellitus, stroke

EXAM:
CT HEAD WITHOUT CONTRAST
CT CERVICAL SPINE WITHOUT CONTRAST
TECHNIQUE: Multidetector CT imaging of the head and cervical spine was
performed following the standard protocol without intravenous
contrast. Multiplanar CT image reconstructions of the cervical spine
were also generated.

[Series 2: headseq 4.8 h37s · axial · 0.47mm/px · z∈[+343,+391]mm · 2 of 30 slices shown, 3 images]
[im 10/30  brain]
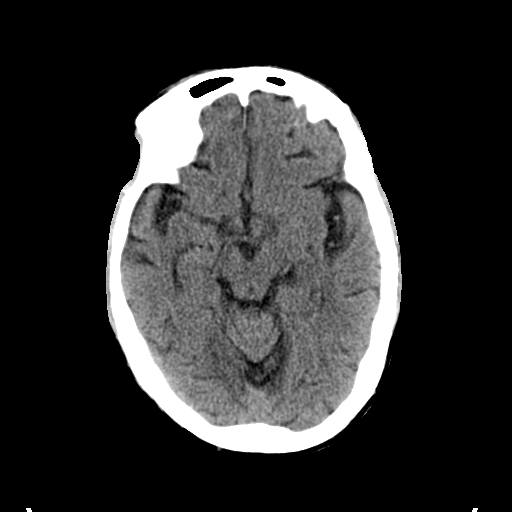
[im 10/30  bone]
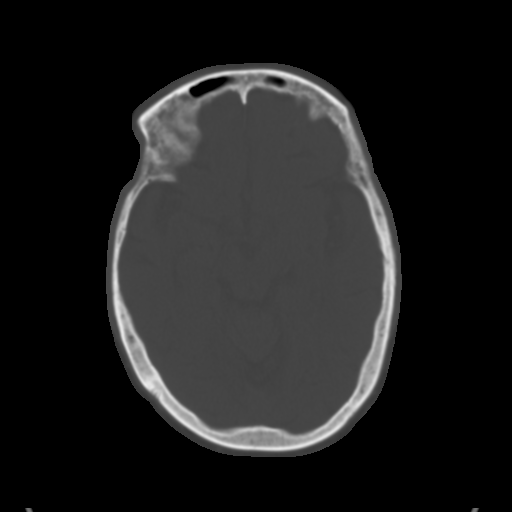
[im 20/30  brain]
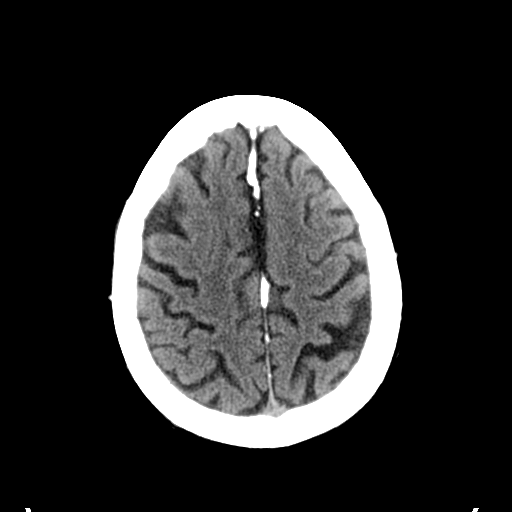

[Series 7: sagittal bone 2.0 · sagittal · 0.24mm/px · 3 of 60 slices shown]
[im 20/60  brain]
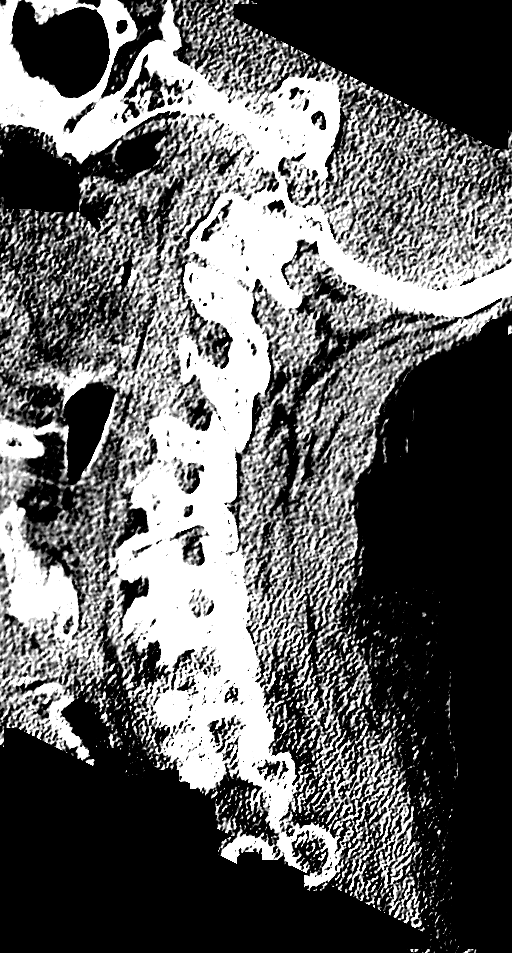
[im 30/60  brain]
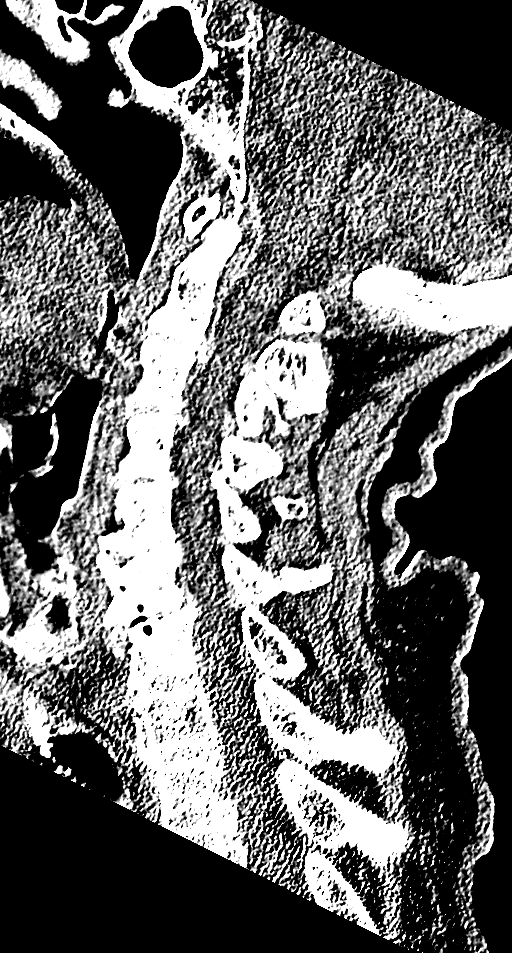
[im 40/60  brain]
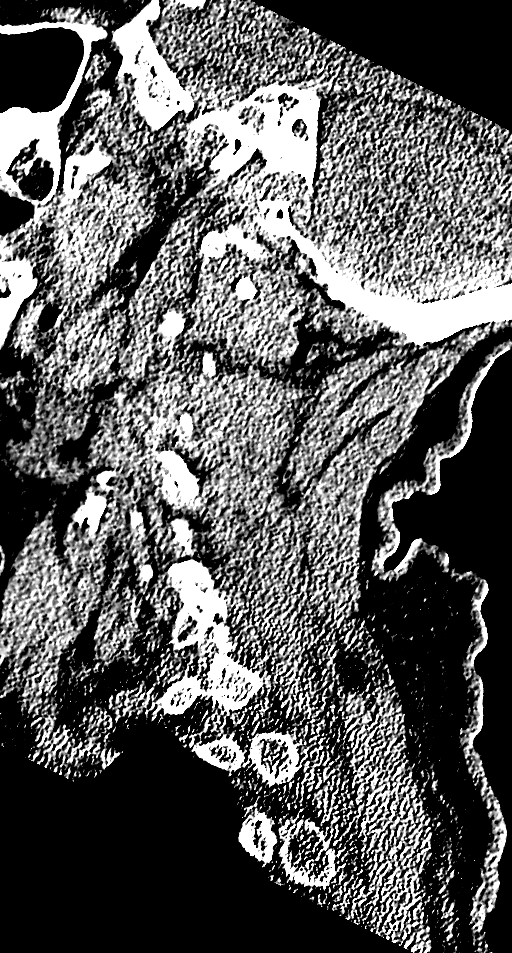

[Series 8: coronal bone 2.0 · coronal · 0.19mm/px · 3 of 54 slices shown]
[im 18/54  brain]
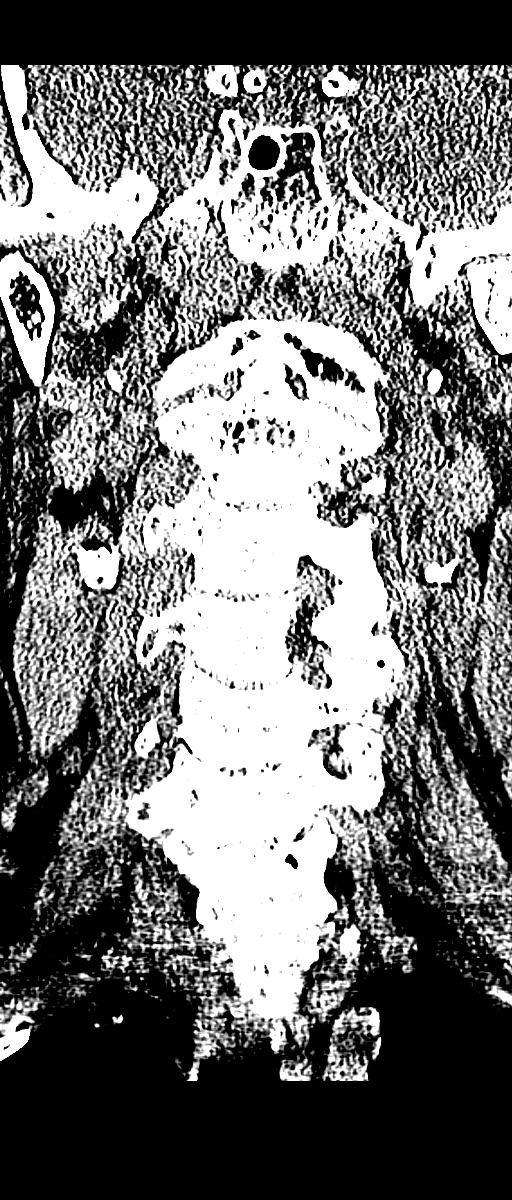
[im 24/54  brain]
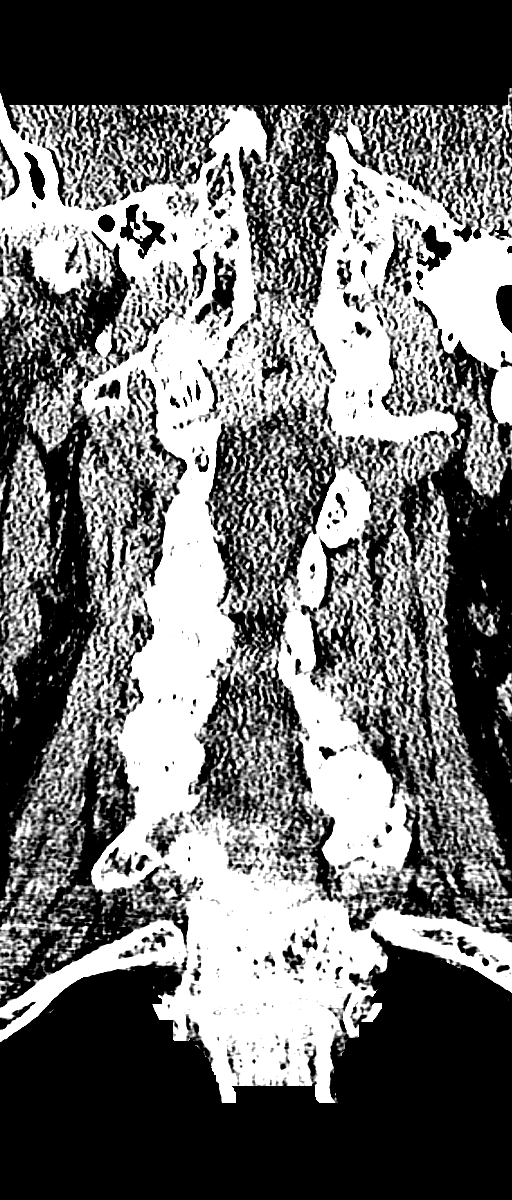
[im 30/54  brain]
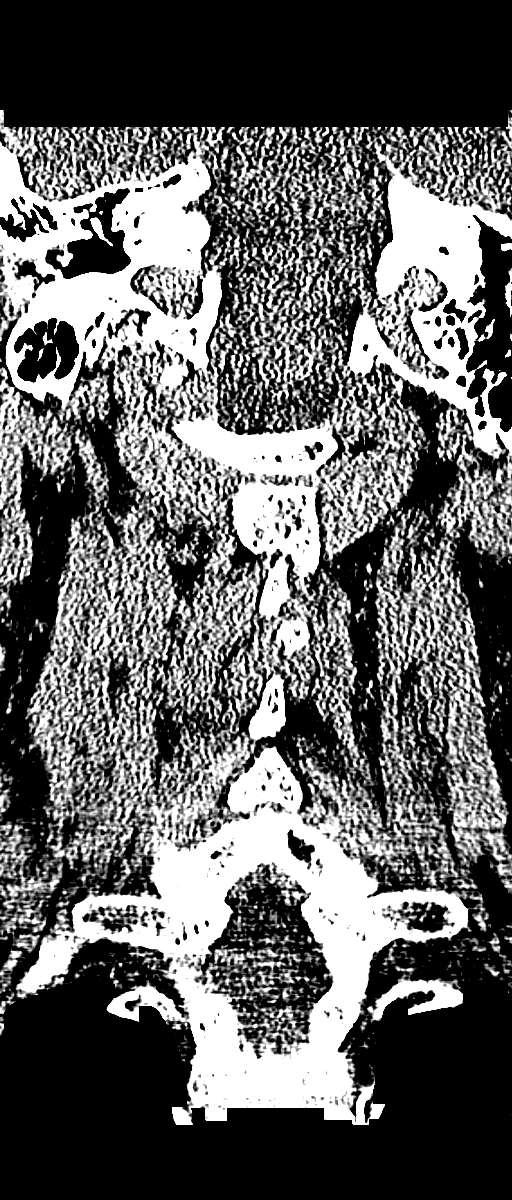

[Series 9: axial bone 2.0 · axial · 0.22mm/px · z∈[+146,+190]mm · 3 of 97 slices shown]
[im 9/97  bone]
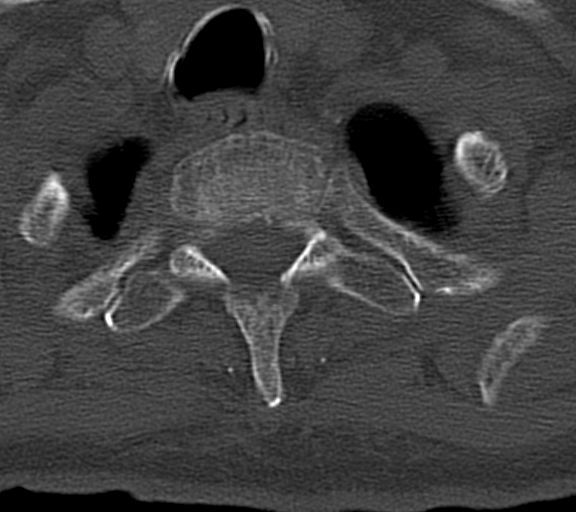
[im 25/97  bone]
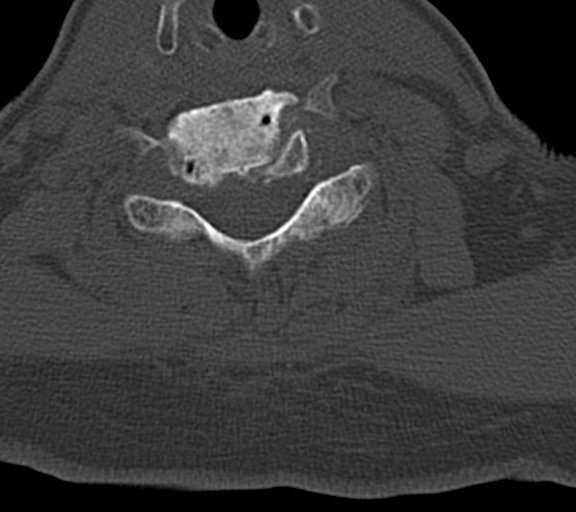
[im 33/97  bone]
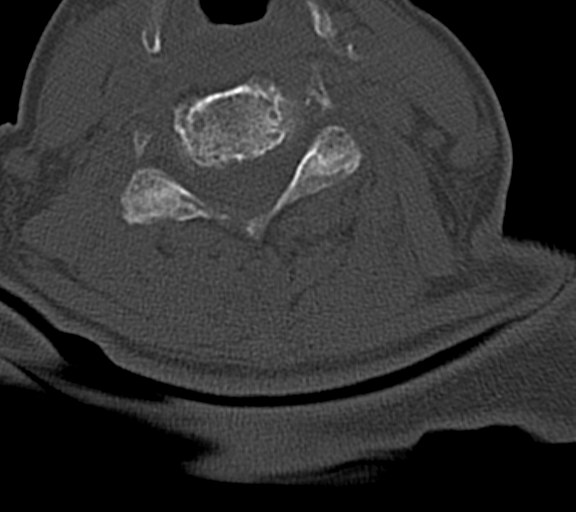

[11 of 47 positions shown; findings below may reference images not displayed]

FINDINGS: CT HEAD FINDINGS

Normal ventricular morphology.

No midline shift or mass effect.

Minimal small vessel chronic ischemic changes of deep cerebral white
matter.

Old lacunar infarcts in periventricular white matter bilaterally.

Old LEFT pontine lacunar infarct.

No intracranial hemorrhage, mass lesion, or evidence acute
infarction.

No extra-axial fluid collections.

Calcifications within falx.

Visualized paranasal sinuses and mastoid air cells clear.

Atherosclerotic calcifications of the carotid siphons.

Bones demineralized.

CT CERVICAL SPINE FINDINGS

Prevertebral soft tissues normal thickness.

Diffuse osseous demineralization.

Disc space narrowing with endplate spur formation C5-C6 and C6-C7.

Significant discogenic sclerosis C6-C7.

Scattered mild facet degenerative changes bilaterally.

Vertebral body heights maintained without fracture or subluxation.

Visualized skullbase intact.

Atherosclerotic calcification of the vertebral arteries and carotid
bifurcations.

Lung apices clear.
IMPRESSION: Atrophy with small vessel chronic ischemic changes of deep cerebral
white matter.

Old periventricular white matter and LEFT pontine infarcts.

No acute intracranial abnormalities.

Osseous demineralization with multilevel degenerative disc and facet
disease changes cervical spine.

No acute cervical spine abnormalities.

Extensive atherosclerotic calcifications.

## 2015-11-04 IMAGING — CT CT HEAD W/O CM
1 series · 1 of 1 positions shown · non-contrast
Comparison: CT head 04/21/2013

CLINICAL DATA: Flipped a wheelchair in no parking lot at [place],
found lying on RIGHT side, past history of hypertension, diabetes
mellitus, stroke

EXAM:
CT HEAD WITHOUT CONTRAST
CT CERVICAL SPINE WITHOUT CONTRAST
TECHNIQUE: Multidetector CT imaging of the head and cervical spine was
performed following the standard protocol without intravenous
contrast. Multiplanar CT image reconstructions of the cervical spine
were also generated.

[Series 1: topogram 0.6 t20s · sagittal · 0.6mm · 1.00mm/px · 1 of 1 slices shown]
[im 1/1]
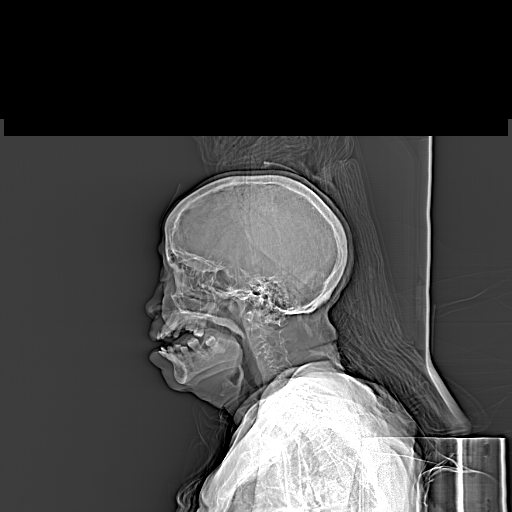

[1 of 1 positions shown; findings below may reference images not displayed]

FINDINGS: CT HEAD FINDINGS

Normal ventricular morphology.

No midline shift or mass effect.

Minimal small vessel chronic ischemic changes of deep cerebral white
matter.

Old lacunar infarcts in periventricular white matter bilaterally.

Old LEFT pontine lacunar infarct.

No intracranial hemorrhage, mass lesion, or evidence acute
infarction.

No extra-axial fluid collections.

Calcifications within falx.

Visualized paranasal sinuses and mastoid air cells clear.

Atherosclerotic calcifications of the carotid siphons.

Bones demineralized.

CT CERVICAL SPINE FINDINGS

Prevertebral soft tissues normal thickness.

Diffuse osseous demineralization.

Disc space narrowing with endplate spur formation C5-C6 and C6-C7.

Significant discogenic sclerosis C6-C7.

Scattered mild facet degenerative changes bilaterally.

Vertebral body heights maintained without fracture or subluxation.

Visualized skullbase intact.

Atherosclerotic calcification of the vertebral arteries and carotid
bifurcations.

Lung apices clear.
IMPRESSION: Atrophy with small vessel chronic ischemic changes of deep cerebral
white matter.

Old periventricular white matter and LEFT pontine infarcts.

No acute intracranial abnormalities.

Osseous demineralization with multilevel degenerative disc and facet
disease changes cervical spine.

No acute cervical spine abnormalities.

Extensive atherosclerotic calcifications.

## 2016-05-02 IMAGING — CR DG CHEST 1V PORT
1 series · 1 of 1 positions shown · non-contrast
Comparison: 04/21/2013

CLINICAL DATA: PICC line placement.  Acute renal failure.

EXAM:
PORTABLE CHEST 1 VIEW

[ap portable]
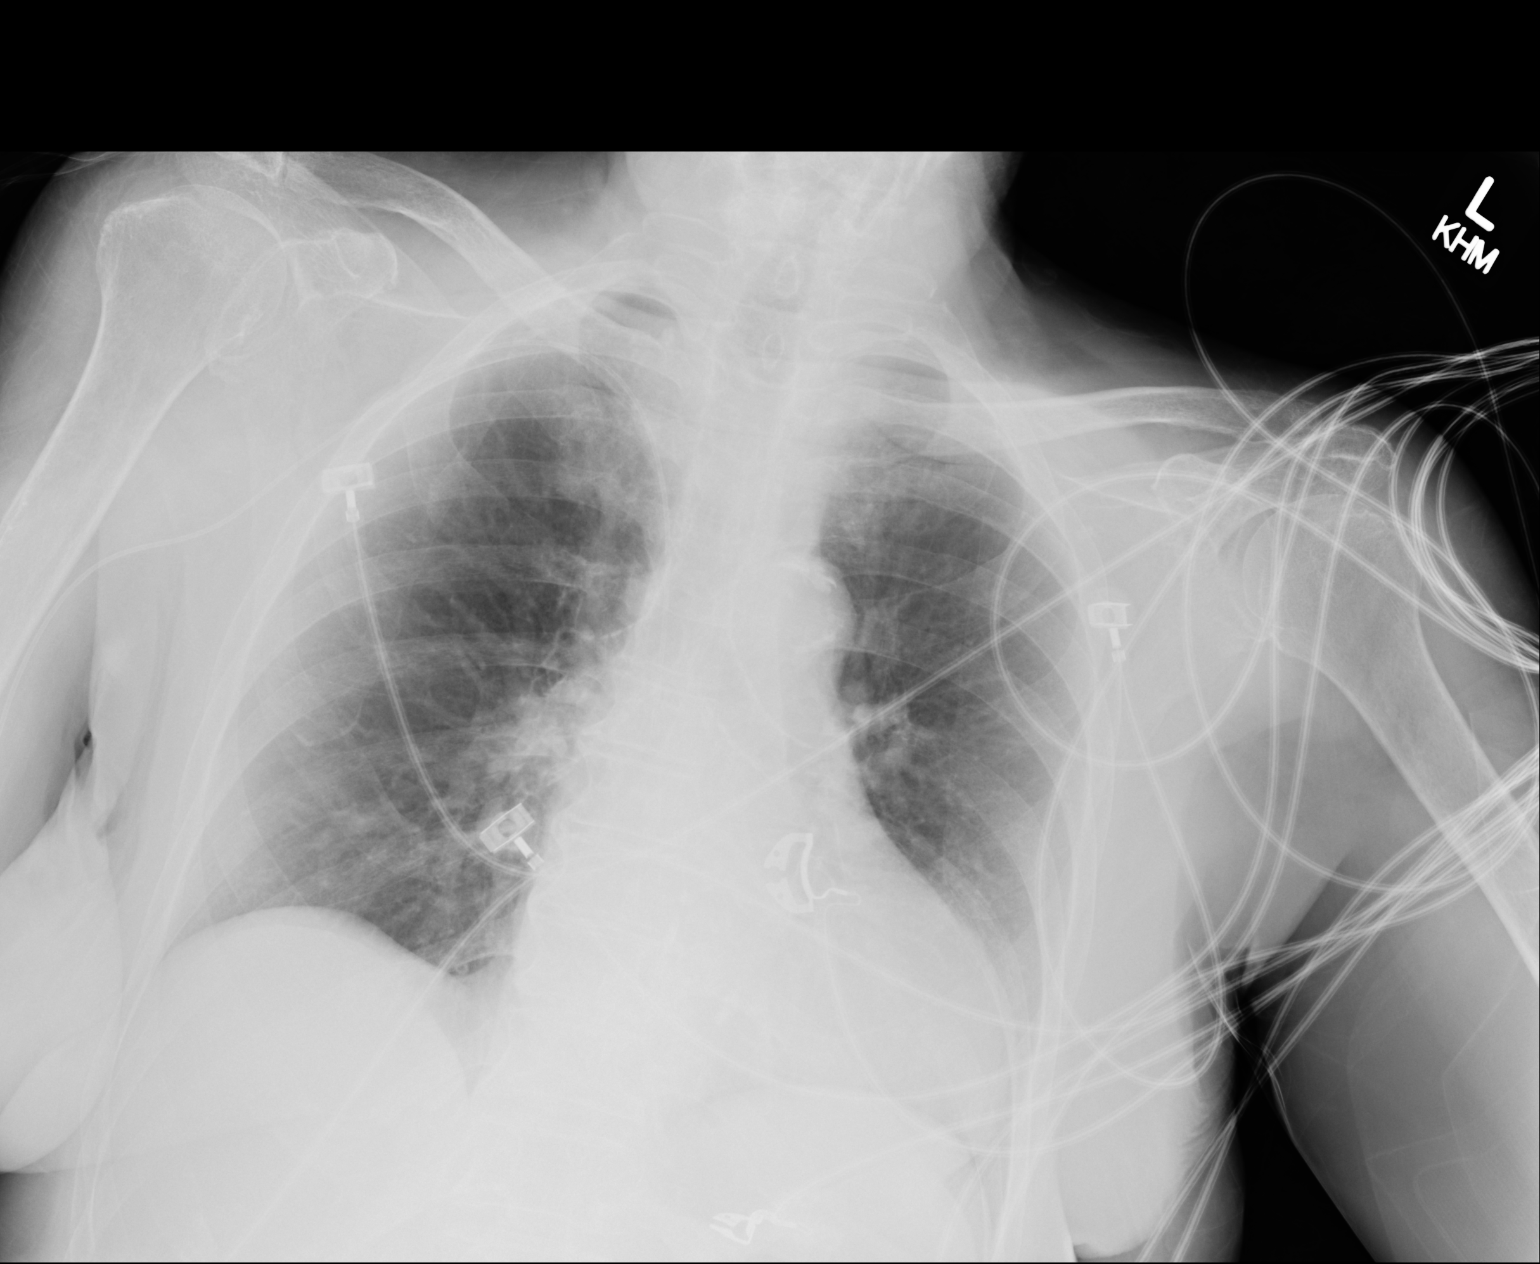

[1 of 1 positions shown; findings below may reference images not displayed]

FINDINGS: Right PICC line tip:  SVC.  No pneumothorax.

Borderline enlargement of the cardiopericardial silhouette with
atherosclerotic aortic arch.

The lungs appear clear.
IMPRESSION: 1. Right PICC line tip:  SVC.  No complicating feature.
2. Borderline enlargement of the cardiopericardial silhouette.
3. Atherosclerotic aortic arch.

## 2016-12-18 IMAGING — US US RENAL
1 series · 14 of 25 positions shown · non-contrast
Comparison: CT abdomen pelvis dated 12/18/2009

CLINICAL DATA: Acute kidney injury, UTI

EXAM:
RENAL / URINARY TRACT ULTRASOUND COMPLETE

[Series 1: us renal · 0.19mm/px · 14 of 46 slices shown]
[im 1/46]
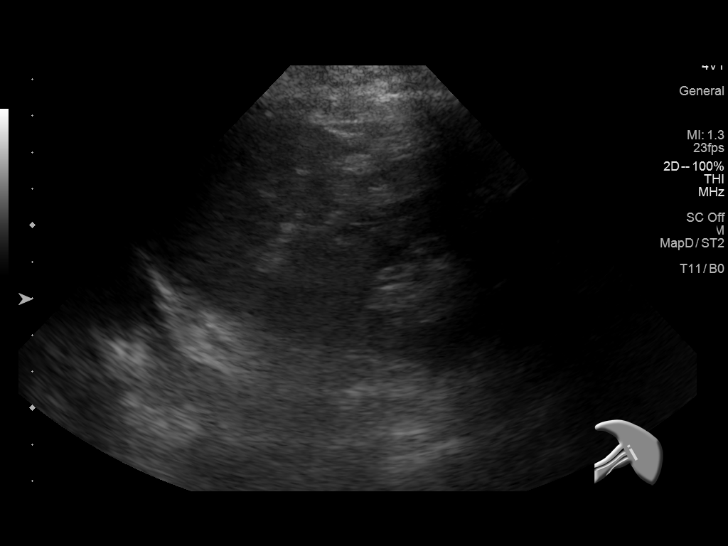
[im 4/46]
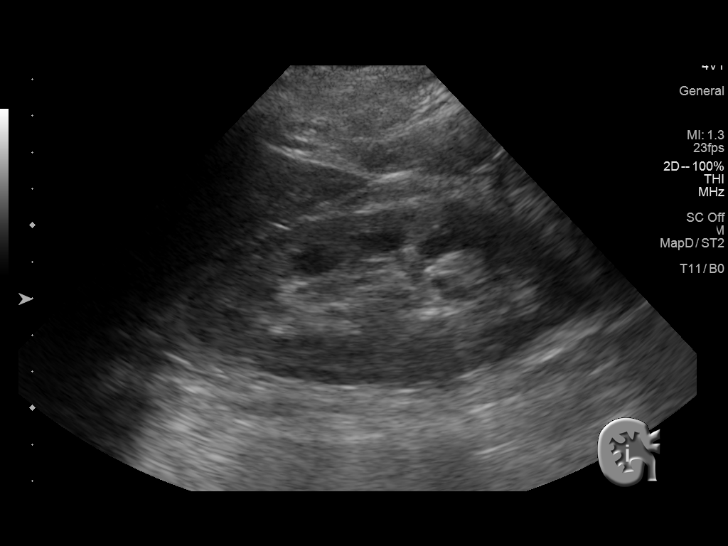
[im 8/46]
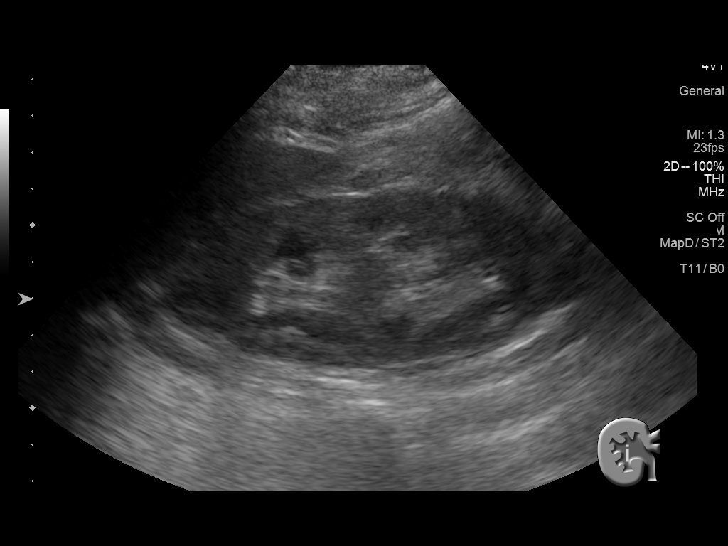
[im 12/46]
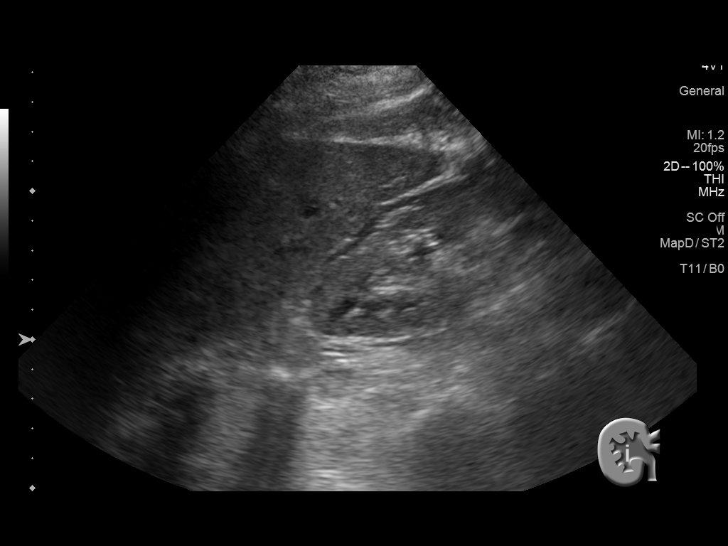
[im 16/46]
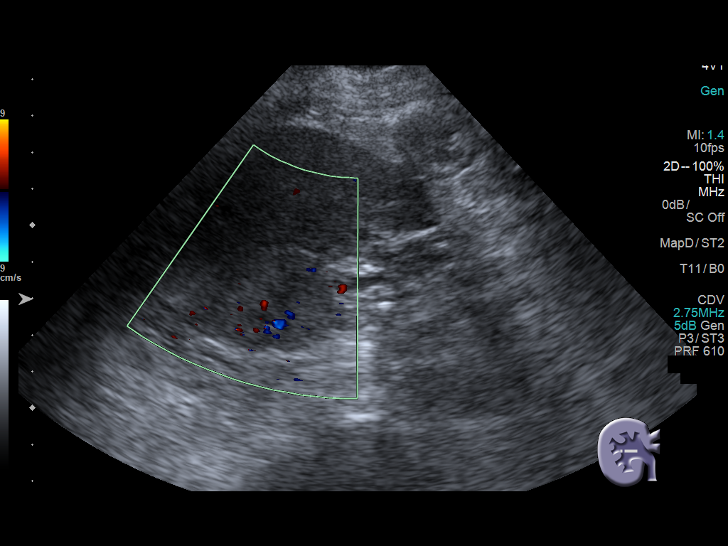
[im 17/46]
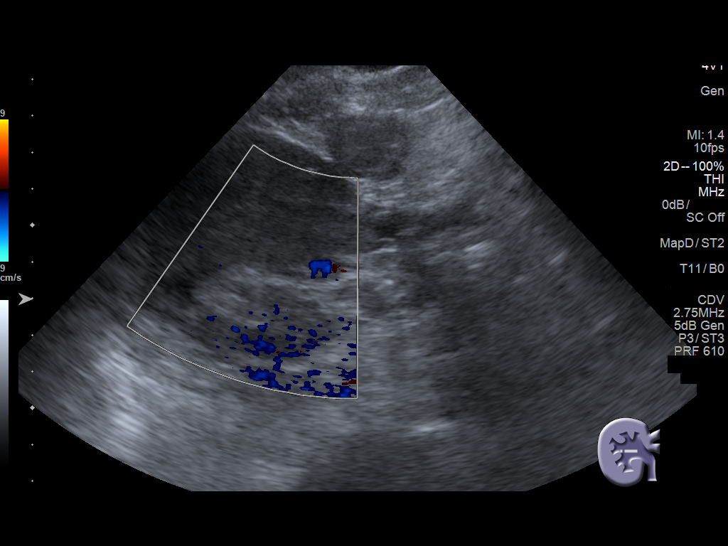
[im 21/46]
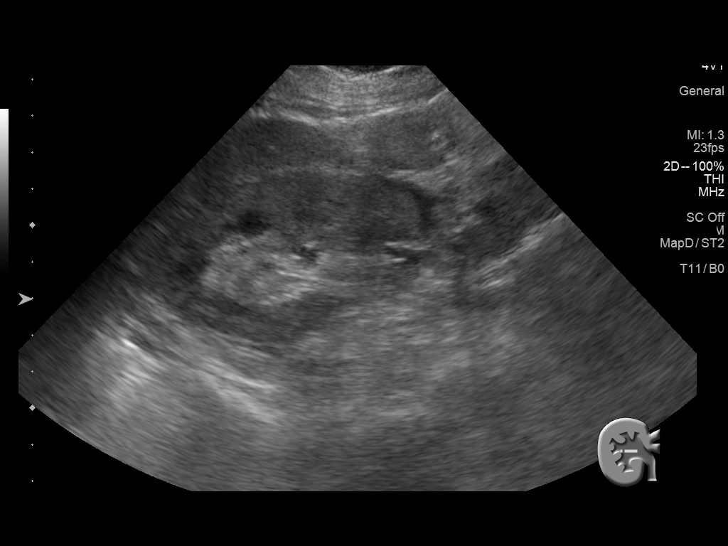
[im 25/46]
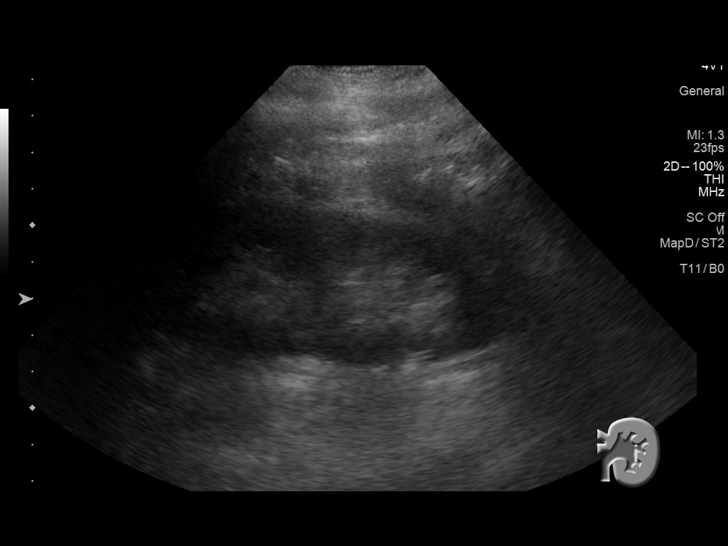
[im 29/46]
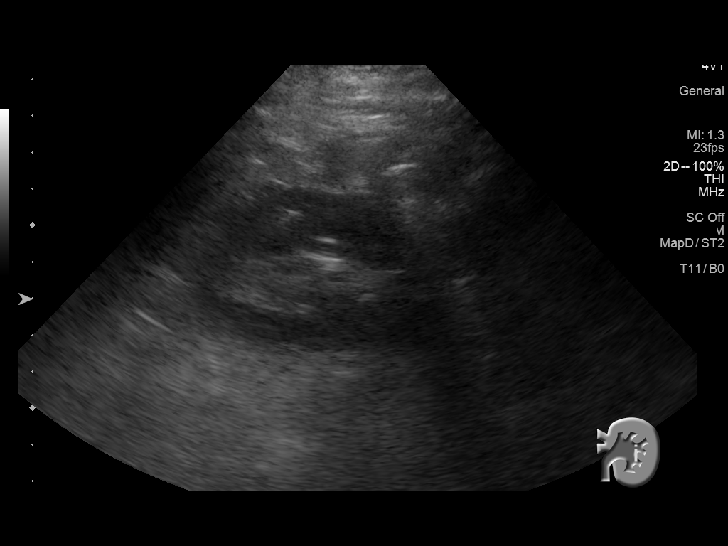
[im 31/46]
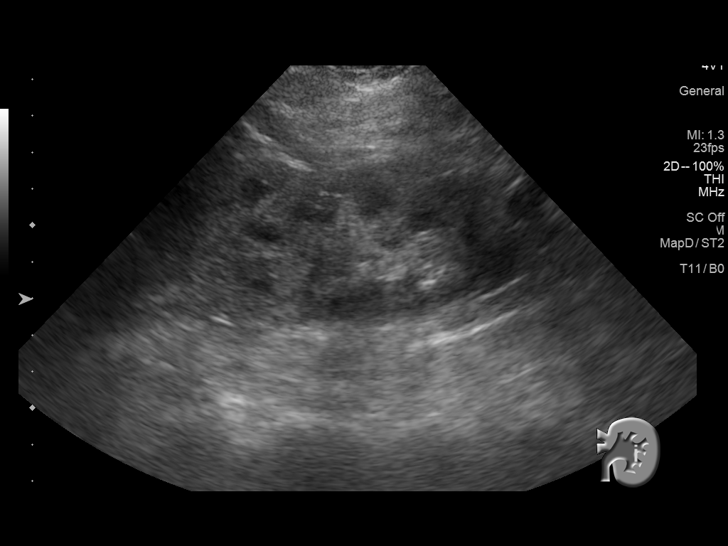
[im 34/46]
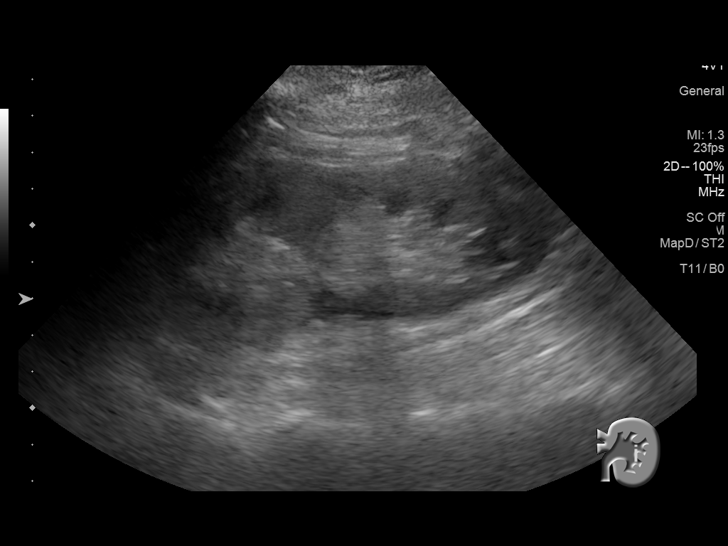
[im 38/46]
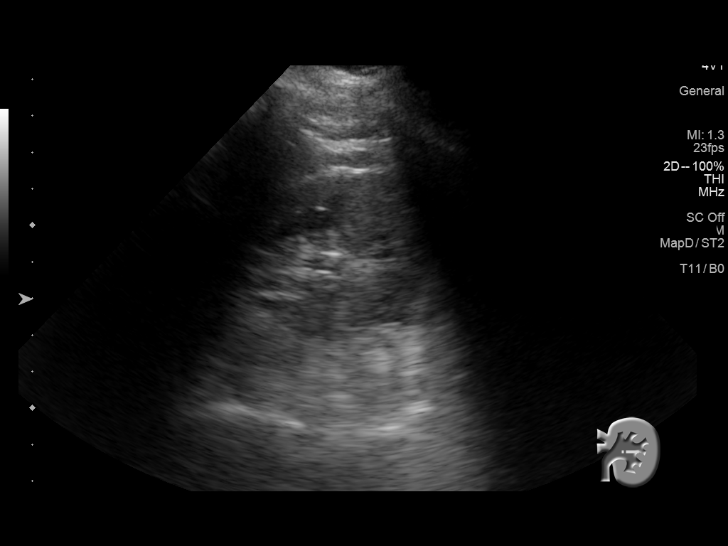
[im 42/46]
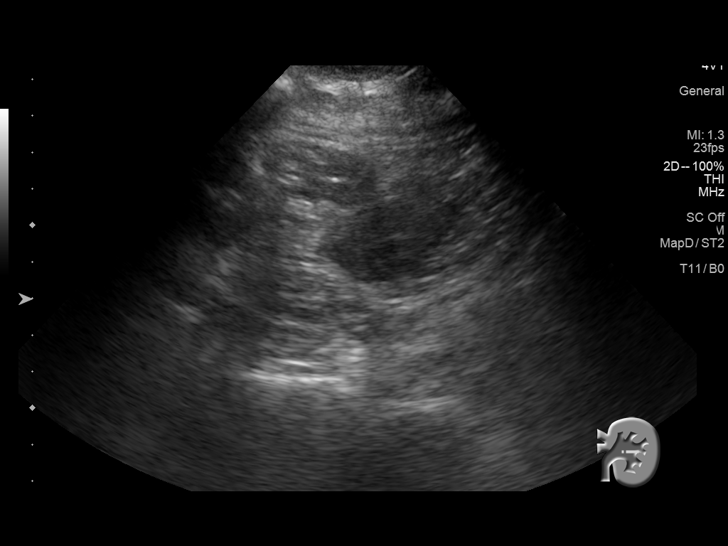
[im 46/46]
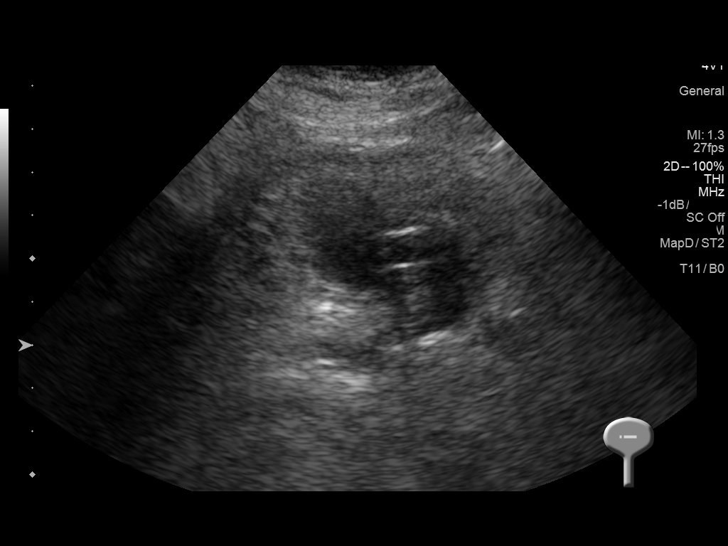

[14 of 25 positions shown; findings below may reference images not displayed]

FINDINGS: Right Kidney:

Length: 11.1 cm. Echogenic renal parenchyma, suggesting medical
renal disease. No hydronephrosis.

Left Kidney:

Length: 10.9 cm. Echogenic renal parenchyma, suggesting medical
renal disease. No hydronephrosis.

Bladder:

Decompressed by indwelling Foley catheter.
IMPRESSION: Echogenic renal parenchyma, suggesting medical renal disease.

No hydronephrosis.

Bladder decompressed by indwelling Foley catheter.
